# Patient Record
Sex: Female | Born: 1965 | Race: Black or African American | Hispanic: No | Marital: Single | Smoking: Former smoker
Health system: Southern US, Community
[De-identification: ages and names within clinical notes are randomized; demographics above are authoritative.]

## PROBLEM LIST (undated history)

## (undated) ENCOUNTER — Emergency Department (HOSPITAL_COMMUNITY): Discharge status: Absent without leave | Payer: Medicaid Other

## (undated) ENCOUNTER — Ambulatory Visit (HOSPITAL_COMMUNITY): Admission: EM | Payer: Medicaid Other | Source: Home / Self Care

## (undated) DIAGNOSIS — I639 Cerebral infarction, unspecified: Secondary | ICD-10-CM

## (undated) DIAGNOSIS — F209 Schizophrenia, unspecified: Secondary | ICD-10-CM

## (undated) DIAGNOSIS — F32A Depression, unspecified: Secondary | ICD-10-CM

## (undated) DIAGNOSIS — R569 Unspecified convulsions: Secondary | ICD-10-CM

## (undated) HISTORY — PX: LIMB SPARING RESECTION HIP W/ SADDLE JOINT REPLACEMENT: SUR829

---

## 2013-03-07 ENCOUNTER — Encounter (HOSPITAL_COMMUNITY): Payer: Self-pay | Admitting: Emergency Medicine

## 2013-03-07 ENCOUNTER — Emergency Department (HOSPITAL_COMMUNITY)
Admission: EM | Admit: 2013-03-07 | Discharge: 2013-03-07 | Disposition: A | Discharge status: Home / Self Care | Payer: Medicaid - Out of State | Attending: Emergency Medicine | Admitting: Emergency Medicine

## 2013-03-07 DIAGNOSIS — K08109 Complete loss of teeth, unspecified cause, unspecified class: Secondary | ICD-10-CM | POA: Insufficient documentation

## 2013-03-07 DIAGNOSIS — K0889 Other specified disorders of teeth and supporting structures: Secondary | ICD-10-CM

## 2013-03-07 DIAGNOSIS — Z792 Long term (current) use of antibiotics: Secondary | ICD-10-CM | POA: Insufficient documentation

## 2013-03-07 DIAGNOSIS — Z8659 Personal history of other mental and behavioral disorders: Secondary | ICD-10-CM | POA: Insufficient documentation

## 2013-03-07 DIAGNOSIS — R509 Fever, unspecified: Secondary | ICD-10-CM | POA: Insufficient documentation

## 2013-03-07 DIAGNOSIS — Z880 Allergy status to penicillin: Secondary | ICD-10-CM | POA: Insufficient documentation

## 2013-03-07 DIAGNOSIS — Z8669 Personal history of other diseases of the nervous system and sense organs: Secondary | ICD-10-CM | POA: Insufficient documentation

## 2013-03-07 DIAGNOSIS — K089 Disorder of teeth and supporting structures, unspecified: Secondary | ICD-10-CM | POA: Insufficient documentation

## 2013-03-07 DIAGNOSIS — Z8673 Personal history of transient ischemic attack (TIA), and cerebral infarction without residual deficits: Secondary | ICD-10-CM | POA: Insufficient documentation

## 2013-03-07 DIAGNOSIS — Z791 Long term (current) use of non-steroidal anti-inflammatories (NSAID): Secondary | ICD-10-CM | POA: Insufficient documentation

## 2013-03-07 DIAGNOSIS — K006 Disturbances in tooth eruption: Secondary | ICD-10-CM | POA: Insufficient documentation

## 2013-03-07 DIAGNOSIS — F17200 Nicotine dependence, unspecified, uncomplicated: Secondary | ICD-10-CM | POA: Insufficient documentation

## 2013-03-07 HISTORY — DX: Unspecified convulsions: R56.9

## 2013-03-07 HISTORY — DX: Cerebral infarction, unspecified: I63.9

## 2013-03-07 HISTORY — DX: Schizophrenia, unspecified: F20.9

## 2013-03-07 MED ORDER — IBUPROFEN 800 MG PO TABS: 800.0000 mg | ORAL_TABLET | Freq: Three times a day (TID) | ORAL | Status: DC

## 2013-03-07 MED ORDER — CLINDAMYCIN HCL 150 MG PO CAPS: 300.0000 mg | ORAL_CAPSULE | Freq: Three times a day (TID) | ORAL | Status: DC

## 2013-03-07 MED ORDER — HYDROCODONE-ACETAMINOPHEN 5-325 MG PO TABS: 1.0000 | ORAL_TABLET | Freq: Four times a day (QID) | ORAL | Status: DC | PRN
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
## 2013-03-07 NOTE — ED Notes (Signed)
Pt c/o pain to upper rt side of mouth that has been going on for about a week. Pt states she has not been able to go to a dentist because she just moved here from out of town. Pt has multiple dental carries and missing teeth. Pt rates pain 10/10. Swelling noted to gums.

## 2013-03-07 NOTE — ED Notes (Signed)
Discharge instructions reviewed with pt. Pt verbalized understanding.   

## 2013-03-07 NOTE — ED Provider Notes (Signed)
CSN: [id number]     Arrival date & time 03/07/13  1522 History   This chart was scribed for Jaynie Crumble, PA-C, working with Shanna Cisco, MD, by Andrew Au, ED Scribe. This patient was seen in room TR10C/TR10C and the patient's care was started at 4:30 PM  Chief Complaint  Patient presents with  . Dental Pain    The history is provided by the patient. No language interpreter was used.   HPI Comments: Joanne Gonzalez is a 47 y.o. female who presents to the Emergency Department complaining of 2 days of gradual onset, gradually worsening, constant right upper dental pain. Pt reports that she has had an associated fever of 102 last night which has subsided. ED is 98.56F. Pt states that she has been taking aleve with relief of fever but without relief to dental pain. She reports that she has history of teeth removal. Pt states the she has no medication allergies. Pt states that she drove herself to ED.   Past Medical History  Diagnosis Date  . CVA (cerebral infarction)   . Seizures   . Schizophrenia    History reviewed. No pertinent past surgical history. History reviewed. No pertinent family history. History  Substance Use Topics  . Smoking status: Current Every Day Smoker -- 0.50 packs/day    Types: Cigarettes  . Smokeless tobacco: Not on file  . Alcohol Use: No   OB History   Grav Para Term Preterm Abortions TAB SAB Ect Mult Living                 Review of Systems  Constitutional: Positive for fever.  HENT: Positive for dental problem. Negative for facial swelling.   All other systems reviewed and are negative.   Allergies  Penicillins and Lactose intolerance (gi)  Home Medications   Current Outpatient Rx  Name  Route  Sig  Dispense  Refill  . clindamycin (CLEOCIN) 150 MG capsule   Oral   Take 2 capsules (300 mg total) by mouth 3 (three) times daily.   42 capsule   0   . HYDROcodone-acetaminophen (NORCO) 5-325 MG per tablet   Oral   Take 1 tablet by  mouth every 6 (six) hours as needed.   20 tablet   0   . ibuprofen (ADVIL,MOTRIN) 800 MG tablet   Oral   Take 1 tablet (800 mg total) by mouth 3 (three) times daily.   21 tablet   0     Triage Vitals BP 136/76  Pulse 88  Temp(Src) 98.1 F (36.7 C) (Oral)  Resp 18  Wt 132 lb 4.8 oz (60.011 kg)  SpO2 100%  LMP 02/08/2013  Physical Exam  Nursing note and vitals reviewed. Constitutional: She is oriented to person, place, and time. She appears well-developed and well-nourished. No distress.  HENT:  Head: Normocephalic and atraumatic.  Poor dentition. Multiple missing teeth. Mild swelling surrounding right upper first molar with tenderness to palpation. No facial swelling or swelling of the tongue   Eyes: EOM are normal.  Neck: Neck supple. No tracheal deviation present.  Cardiovascular: Normal rate.   Pulmonary/Chest: Effort normal. No respiratory distress.  Musculoskeletal: Normal range of motion.  Neurological: She is alert and oriented to person, place, and time.  Skin: Skin is warm and dry.  Psychiatric: She has a normal mood and affect. Her behavior is normal.    ED Course  Procedures (including critical care time)  DIAGNOSTIC STUDIES: Oxygen Saturation is 100% on RA, normal by  my interpretation.    COORDINATION OF CARE: 4:43 PM-  Has been ordered antibiotics and pain medication. Will give referral to dentistry and oral surgery and Pt advised of plan for treatment and pt agrees.   Labs Review Labs Reviewed - No data to display Imaging Review No results found.  EKG Interpretation   None       MDM   1. Pain, dental     Patient with poor dentition. She has multiple caries and multiple missing teeth. Will cover for possible abscess. Will start on clindamycin since she is allergic to penicillins, pain medication, given a resource guide for dental and psychiatric referrals.   Filed Vitals:   03/07/13 1530  BP: 136/76  Pulse: 88  Temp: 98.1 F (36.7 C)   TempSrc: Oral  Resp: 18  Weight: 132 lb 4.8 oz (60.011 kg)  SpO2: 100%    I personally performed the services described in this documentation, which was scribed in my presence. The recorded information has been reviewed and is accurate.    Lottie Mussel, PA-C 03/08/13 0128

## 2013-03-08 NOTE — ED Provider Notes (Signed)
Medical screening examination/treatment/procedure(s) were performed by non-physician practitioner and as supervising physician I was immediately available for consultation/collaboration.  EKG Interpretation   None         Shanna Cisco, MD 03/08/13 0131

## 2013-04-13 ENCOUNTER — Emergency Department (HOSPITAL_COMMUNITY)
Admission: EM | Admit: 2013-04-13 | Discharge: 2013-04-13 | Disposition: A | Discharge status: Home / Self Care | Payer: Medicaid Other | Attending: Emergency Medicine | Admitting: Emergency Medicine

## 2013-04-13 ENCOUNTER — Emergency Department (HOSPITAL_COMMUNITY): Payer: Medicaid Other

## 2013-04-13 ENCOUNTER — Encounter (HOSPITAL_COMMUNITY): Payer: Self-pay | Admitting: Emergency Medicine

## 2013-04-13 DIAGNOSIS — Z791 Long term (current) use of non-steroidal anti-inflammatories (NSAID): Secondary | ICD-10-CM | POA: Insufficient documentation

## 2013-04-13 DIAGNOSIS — G8190 Hemiplegia, unspecified affecting unspecified side: Secondary | ICD-10-CM

## 2013-04-13 DIAGNOSIS — Z8673 Personal history of transient ischemic attack (TIA), and cerebral infarction without residual deficits: Secondary | ICD-10-CM | POA: Insufficient documentation

## 2013-04-13 DIAGNOSIS — R0789 Other chest pain: Secondary | ICD-10-CM | POA: Insufficient documentation

## 2013-04-13 DIAGNOSIS — Z880 Allergy status to penicillin: Secondary | ICD-10-CM | POA: Insufficient documentation

## 2013-04-13 DIAGNOSIS — Z8659 Personal history of other mental and behavioral disorders: Secondary | ICD-10-CM | POA: Insufficient documentation

## 2013-04-13 DIAGNOSIS — F17200 Nicotine dependence, unspecified, uncomplicated: Secondary | ICD-10-CM | POA: Insufficient documentation

## 2013-04-13 LAB — URINALYSIS, ROUTINE W REFLEX MICROSCOPIC
Bilirubin Urine: NEGATIVE
Glucose, UA: NEGATIVE mg/dL
Hgb urine dipstick: NEGATIVE
Ketones, ur: NEGATIVE mg/dL
Nitrite: NEGATIVE
PH: 6.5 (ref 5.0–8.0)
Protein, ur: NEGATIVE mg/dL
SPECIFIC GRAVITY, URINE: 1.011 (ref 1.005–1.030)
Urobilinogen, UA: 0.2 mg/dL (ref 0.0–1.0)

## 2013-04-13 LAB — COMPREHENSIVE METABOLIC PANEL
ALT: 16 U/L (ref 0–35)
AST: 19 U/L (ref 0–37)
Albumin: 3.9 g/dL (ref 3.5–5.2)
Alkaline Phosphatase: 67 U/L (ref 39–117)
BILIRUBIN TOTAL: 0.3 mg/dL (ref 0.3–1.2)
BUN: 13 mg/dL (ref 6–23)
CHLORIDE: 100 meq/L (ref 96–112)
CO2: 22 mEq/L (ref 19–32)
CREATININE: 0.67 mg/dL (ref 0.50–1.10)
Calcium: 10 mg/dL (ref 8.4–10.5)
GFR calc non Af Amer: >90 mL/min (ref >90)
GLUCOSE: 86 mg/dL (ref 70–99)
Potassium: 4.3 mEq/L (ref 3.7–5.3)
Sodium: 136 mEq/L — ABNORMAL LOW (ref 137–147)
Total Protein: 7.9 g/dL (ref 6.0–8.3)

## 2013-04-13 LAB — ETHANOL

## 2013-04-13 LAB — URINE MICROSCOPIC-ADD ON

## 2013-04-13 LAB — POCT I-STAT TROPONIN I
TROPONIN I, POC: 0 ng/mL (ref 0.00–0.08)
Troponin i, poc: 0 ng/mL (ref 0.00–0.08)

## 2013-04-13 LAB — CBC
HEMATOCRIT: 38.4 % (ref 36.0–46.0)
HEMOGLOBIN: 13.1 g/dL (ref 12.0–15.0)
MCH: 30.4 pg (ref 26.0–34.0)
MCHC: 34.1 g/dL (ref 30.0–36.0)
MCV: 89.1 fL (ref 78.0–100.0)
Platelets: 307 10*3/uL (ref 150–400)
RBC: 4.31 MIL/uL (ref 3.87–5.11)
RDW: 15.4 % (ref 11.5–15.5)
WBC: 6.9 10*3/uL (ref 4.0–10.5)

## 2013-04-13 LAB — DIFFERENTIAL
BASOS PCT: 1 % (ref 0–1)
Basophils Absolute: 0.1 10*3/uL (ref 0.0–0.1)
EOS ABS: 0.2 10*3/uL (ref 0.0–0.7)
EOS PCT: 2 % (ref 0–5)
LYMPHS ABS: 2.1 10*3/uL (ref 0.7–4.0)
Lymphocytes Relative: 32 % (ref 12–46)
MONO ABS: 0.7 10*3/uL (ref 0.1–1.0)
MONOS PCT: 11 % (ref 3–12)
NEUTROS PCT: 55 % (ref 43–77)
Neutro Abs: 3.6 10*3/uL (ref 1.7–7.7)

## 2013-04-13 LAB — GLUCOSE, CAPILLARY: Glucose-Capillary: 95 mg/dL (ref 70–99)

## 2013-04-13 LAB — APTT: aPTT: 31 seconds (ref 24–37)

## 2013-04-13 LAB — RAPID URINE DRUG SCREEN, HOSP PERFORMED
Amphetamines: NOT DETECTED
Barbiturates: NOT DETECTED
Benzodiazepines: NOT DETECTED
COCAINE: NOT DETECTED
Opiates: NOT DETECTED
TETRAHYDROCANNABINOL: NOT DETECTED

## 2013-04-13 LAB — PROTIME-INR
INR: 0.96 (ref 0.00–1.49)
PROTHROMBIN TIME: 12.6 s (ref 11.6–15.2)

## 2013-04-13 MED ORDER — LORAZEPAM 2 MG/ML IJ SOLN: 1.0000 mg | Freq: Once | INTRAMUSCULAR | Status: DC

## 2013-04-13 MED ORDER — MIDAZOLAM HCL 5 MG/ML IJ SOLN
10.0000 mg | Freq: Once | INTRAMUSCULAR | Status: DC
  Filled 2013-04-13: qty 2

## 2013-04-13 MED ORDER — QUETIAPINE FUMARATE 25 MG PO TABS
100.0000 mg | ORAL_TABLET | Freq: Every day | ORAL | Status: DC
  Filled 2013-04-13: qty 4

## 2013-04-13 MED ORDER — QUETIAPINE FUMARATE 50 MG PO TABS: 50.0000 mg | ORAL_TABLET | Freq: Every day | ORAL | Status: DC

## 2013-04-13 MED ORDER — QUETIAPINE FUMARATE 25 MG PO TABS
50.0000 mg | ORAL_TABLET | Freq: Once | ORAL | Status: AC
  Administered 2013-04-13: 50 mg via ORAL

## 2013-04-13 MED ORDER — MIDAZOLAM HCL 10 MG/2ML IJ SOLN: 10.0000 mg | Freq: Once | INTRAMUSCULAR | Status: DC
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
## 2013-04-13 NOTE — ED Notes (Signed)
PT c/o intermittent L sided chest pain, L arm pain and L facial "tightness" since yesterday.  Pt also c/o sob.

## 2013-04-13 NOTE — ED Notes (Signed)
Patient is alert and orientedx4.  Patient was explained discharge instructions and they understood them with no questions.  The patient's daughter, Joanne Chars is coming to take the patient home.

## 2013-04-13 NOTE — ED Provider Notes (Signed)
CSN: [id number]     Arrival date & time 04/13/13  1028 History   First MD Initiated Contact with Patient 04/13/13 1051     Chief Complaint  Patient presents with  . Chest Pain  . Arm Pain  . facial tingling    (Consider location/radiation/quality/duration/timing/severity/associated sxs/prior Treatment) HPI Comments: 48 year old female presents with left arm pain as well as facial weakness, and tightness and left arm and leg weakness. States his symptoms started over 48 hours ago have been progressively getting worse. At first she stated these are intermittent and when asked to clarify last time she felt normal she states those 48 hours ago has been getting progressively worse. She also feels a chest pressure. The patient denies any headaches. She states her speech is different. She states these are similar symptoms when she had a CVA multiple years ago. The symptoms are on the same side as well. She states that she fully recovered and had no residual weakness after rehabilitation from her previous stroke.   Past Medical History  Diagnosis Date  . CVA (cerebral infarction)   . Seizures   . Schizophrenia    Past Surgical History  Procedure Laterality Date  . Limb sparing resection hip w/ saddle joint replacement     No family history on file. History  Substance Use Topics  . Smoking status: Current Every Day Smoker -- 0.50 packs/day    Types: Cigarettes  . Smokeless tobacco: Not on file  . Alcohol Use: No   OB History   Grav Para Term Preterm Abortions TAB SAB Ect Mult Living                 Review of Systems  Constitutional: Negative for fever.  Respiratory: Negative for cough and shortness of breath.   Cardiovascular: Positive for chest pain.  Gastrointestinal: Negative for nausea and vomiting.  Musculoskeletal:       Left arm pain  Neurological: Positive for speech difficulty and weakness. Negative for numbness.  All other systems reviewed and are  negative.    Allergies  Penicillins and Lactose intolerance (gi)  Home Medications   Current Outpatient Rx  Name  Route  Sig  Dispense  Refill  . HYDROcodone-acetaminophen (NORCO) 5-325 MG per tablet   Oral   Take 1 tablet by mouth every 6 (six) hours as needed.   20 tablet   0   . ibuprofen (ADVIL,MOTRIN) 800 MG tablet   Oral   Take 1 tablet (800 mg total) by mouth 3 (three) times daily.   21 tablet   0    BP 130/81  Pulse 78  Resp 18  Ht 5\' 4"  (1.626 m)  Wt 130 lb (58.968 kg)  BMI 22.30 kg/m2  SpO2 95%  LMP 04/04/2013 Physical Exam  Vitals reviewed. Constitutional: She is oriented to person, place, and time. She appears well-developed and well-nourished.  HENT:  Head: Normocephalic and atraumatic.  Right Ear: External ear normal.  Left Ear: External ear normal.  Nose: Nose normal.  Eyes: Right eye exhibits no discharge. Left eye exhibits no discharge.  Cardiovascular: Normal rate, regular rhythm and normal heart sounds.   Pulmonary/Chest: Effort normal and breath sounds normal. She exhibits no tenderness.  Abdominal: Soft. There is no tenderness.  Neurological: She is alert and oriented to person, place, and time.  Decreased strength left upper and left lower extremity. She is able to come off the bed but not against resistance. Slight facial droop. Her tongue deviates to the right.  Skin: Skin is warm and dry.  Psychiatric:  Tearful    ED Course  Procedures (including critical care time) Labs Review Labs Reviewed  COMPREHENSIVE METABOLIC PANEL - Abnormal; Notable for the following:    Sodium 136 (*)    All other components within normal limits  URINALYSIS, ROUTINE W REFLEX MICROSCOPIC - Abnormal; Notable for the following:    Leukocytes, UA TRACE (*)    All other components within normal limits  URINE MICROSCOPIC-ADD ON - Abnormal; Notable for the following:    Squamous Epithelial / LPF FEW (*)    Bacteria, UA FEW (*)    All other components within  normal limits  CBC  ETHANOL  PROTIME-INR  APTT  DIFFERENTIAL  URINE RAPID DRUG SCREEN (HOSP PERFORMED)  GLUCOSE, CAPILLARY  POCT I-STAT TROPONIN I  POCT I-STAT TROPONIN I   Imaging Review Dg Chest 2 View  04/13/2013   CLINICAL DATA:  Chest pain, arm pain  EXAM: CHEST  2 VIEW  COMPARISON:  None.  FINDINGS: Low lung volumes. The heart size and mediastinal contours are within normal limits. Both lungs are clear. The visualized skeletal structures are unremarkable.  IMPRESSION: No active cardiopulmonary disease.   Electronically Signed   By: Margaree Mackintosh M.D.   On: 04/13/2013 11:15   Ct Head Wo Contrast  04/13/2013   CLINICAL DATA:  48 year old female with left upper extremity pain, left facial abnormal sensation. Extremity weakness. Initial encounter.  EXAM: CT HEAD WITHOUT CONTRAST  TECHNIQUE: Contiguous axial images were obtained from the base of the skull through the vertex without intravenous contrast.  COMPARISON:  None.  FINDINGS: Visualized paranasal sinuses and mastoids are clear. No acute osseous abnormality identified. Visualized orbits and scalp soft tissues are within normal limits.  Cerebral volume is normal. No midline shift, ventriculomegaly, mass effect, evidence of mass lesion, intracranial hemorrhage or evidence of cortically based acute infarction. Gray-white matter differentiation is within normal limits throughout the brain. No suspicious intracranial vascular hyperdensity.  IMPRESSION: Normal noncontrast CT appearance of the brain.   Electronically Signed   By: Lars Pinks M.D.   On: 04/13/2013 13:16    EKG Interpretation    Date/Time:  Friday April 13 2013 10:31:07 EST Ventricular Rate:  83 PR Interval:  132 QRS Duration: 74 QT Interval:  370 QTC Calculation: 434 R Axis:   39 Text Interpretation:  Normal sinus rhythm Normal ECG No old tracing to compare Confirmed by Alysha Doolan  MD, Roben Schliep ([id number]) on 04/13/2013 10:51:27 AM            MDM   1. Hemiplegia,  unspecified, affecting nondominant side    Patient has exam with weakness but seems inconsistent. However, given her reported history of stroke, she was evaluated as above. Workup is negative. Neurology has seen and also questions whether or not this is a true stroke. They recommend MRI and if negative she can be discharged with outpatient follow up. She has a benign EKG and 2 negative troponins, I feel ACS is highly unlikely. CXR is benign, this is unlikely to represent dissection in this scenario. Care transferred with MRI pending.    Ephraim Hamburger, MD 04/13/13 [phone]

## 2013-04-13 NOTE — ED Provider Notes (Addendum)
49 year old female who presents today stating that she is unable to walk and is weak on her left side the she's been seen and evaluated by Dr. Verner Chol and by neurology. Neurology requested an MRI. They feel that it is unlikely that the constellation of symptoms represents a stroke but she does need evaluation with MRI. Patient was taken to MRI and then refused having an MRI. Patient has return to the emergency department I have discussed with her possible medication options. She continued to state that she doesn't want to take anything and can't have the MRI. She does state that she is often given Seroquel it makes her sleep. She states she is tired but if she goes to sleep she'll be able to have the MRI. We are currently giving her Seroquel 100 mg by mouth and then she will be reevaluated for possibility of MRI.  MRI done without evidence of acute ischemia.  Plan patient ambulation and subsequent discharge.   Shaune Pollack, MD 04/13/13 2238  Patient ambulated here by nursing and daughter called to transport home.   Shaune Pollack, MD 04/13/13 [phone]

## 2013-04-13 NOTE — ED Notes (Signed)
Notified RN of CBG [id number]

## 2013-04-13 NOTE — Discharge Instructions (Signed)
Please follow up with your doctor next week
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
## 2013-04-14 MED ORDER — LIDOCAINE HCL (CARDIAC) 20 MG/ML IV SOLN
INTRAVENOUS | Status: AC
  Filled 2013-04-14: qty 5

## 2013-04-20 ENCOUNTER — Encounter (HOSPITAL_COMMUNITY): Payer: Self-pay | Admitting: Emergency Medicine

## 2013-04-20 ENCOUNTER — Emergency Department (HOSPITAL_COMMUNITY)
Admission: EM | Admit: 2013-04-20 | Discharge: 2013-04-20 | Disposition: A | Discharge status: Home / Self Care | Payer: Medicaid Other | Attending: Emergency Medicine | Admitting: Emergency Medicine

## 2013-04-20 DIAGNOSIS — Z3202 Encounter for pregnancy test, result negative: Secondary | ICD-10-CM | POA: Insufficient documentation

## 2013-04-20 DIAGNOSIS — R5381 Other malaise: Secondary | ICD-10-CM | POA: Insufficient documentation

## 2013-04-20 DIAGNOSIS — Z8673 Personal history of transient ischemic attack (TIA), and cerebral infarction without residual deficits: Secondary | ICD-10-CM | POA: Insufficient documentation

## 2013-04-20 DIAGNOSIS — R002 Palpitations: Secondary | ICD-10-CM | POA: Insufficient documentation

## 2013-04-20 DIAGNOSIS — F411 Generalized anxiety disorder: Secondary | ICD-10-CM | POA: Insufficient documentation

## 2013-04-20 DIAGNOSIS — Z8669 Personal history of other diseases of the nervous system and sense organs: Secondary | ICD-10-CM | POA: Insufficient documentation

## 2013-04-20 DIAGNOSIS — F1410 Cocaine abuse, uncomplicated: Secondary | ICD-10-CM | POA: Insufficient documentation

## 2013-04-20 DIAGNOSIS — Z880 Allergy status to penicillin: Secondary | ICD-10-CM | POA: Insufficient documentation

## 2013-04-20 DIAGNOSIS — Z8659 Personal history of other mental and behavioral disorders: Secondary | ICD-10-CM | POA: Insufficient documentation

## 2013-04-20 DIAGNOSIS — F1490 Cocaine use, unspecified, uncomplicated: Secondary | ICD-10-CM

## 2013-04-20 DIAGNOSIS — R5383 Other fatigue: Secondary | ICD-10-CM

## 2013-04-20 DIAGNOSIS — R109 Unspecified abdominal pain: Secondary | ICD-10-CM | POA: Insufficient documentation

## 2013-04-20 DIAGNOSIS — F17200 Nicotine dependence, unspecified, uncomplicated: Secondary | ICD-10-CM | POA: Insufficient documentation

## 2013-04-20 DIAGNOSIS — R112 Nausea with vomiting, unspecified: Secondary | ICD-10-CM | POA: Insufficient documentation

## 2013-04-20 DIAGNOSIS — Z79899 Other long term (current) drug therapy: Secondary | ICD-10-CM | POA: Insufficient documentation

## 2013-04-20 DIAGNOSIS — T50903A Poisoning by unspecified drugs, medicaments and biological substances, assault, initial encounter: Secondary | ICD-10-CM | POA: Insufficient documentation

## 2013-04-20 DIAGNOSIS — Z791 Long term (current) use of non-steroidal anti-inflammatories (NSAID): Secondary | ICD-10-CM | POA: Insufficient documentation

## 2013-04-20 DIAGNOSIS — R209 Unspecified disturbances of skin sensation: Secondary | ICD-10-CM | POA: Insufficient documentation

## 2013-04-20 DIAGNOSIS — T405X1A Poisoning by cocaine, accidental (unintentional), initial encounter: Secondary | ICD-10-CM | POA: Insufficient documentation

## 2013-04-20 LAB — COMPREHENSIVE METABOLIC PANEL
ALBUMIN: 4.1 g/dL (ref 3.5–5.2)
ALK PHOS: 72 U/L (ref 39–117)
ALT: 17 U/L (ref 0–35)
AST: 18 U/L (ref 0–37)
BUN: 7 mg/dL (ref 6–23)
CO2: 21 mEq/L (ref 19–32)
Calcium: 8.9 mg/dL (ref 8.4–10.5)
Chloride: 101 mEq/L (ref 96–112)
Creatinine, Ser: 0.64 mg/dL (ref 0.50–1.10)
GFR calc Af Amer: >90 mL/min (ref >90)
GFR calc non Af Amer: >90 mL/min (ref >90)
Glucose, Bld: 97 mg/dL (ref 70–99)
POTASSIUM: 3.7 meq/L (ref 3.7–5.3)
SODIUM: 138 meq/L (ref 137–147)
TOTAL PROTEIN: 8.3 g/dL (ref 6.0–8.3)
Total Bilirubin: 0.2 mg/dL — ABNORMAL LOW (ref 0.3–1.2)

## 2013-04-20 LAB — CBC WITH DIFFERENTIAL/PLATELET
BASOS PCT: 1 % (ref 0–1)
Basophils Absolute: 0.1 10*3/uL (ref 0.0–0.1)
Eosinophils Absolute: 0.1 10*3/uL (ref 0.0–0.7)
Eosinophils Relative: 1 % (ref 0–5)
HCT: 39.9 % (ref 36.0–46.0)
Hemoglobin: 13.8 g/dL (ref 12.0–15.0)
Lymphocytes Relative: 23 % (ref 12–46)
Lymphs Abs: 2.3 10*3/uL (ref 0.7–4.0)
MCH: 30.8 pg (ref 26.0–34.0)
MCHC: 34.6 g/dL (ref 30.0–36.0)
MCV: 89.1 fL (ref 78.0–100.0)
MONOS PCT: 9 % (ref 3–12)
Monocytes Absolute: 0.9 10*3/uL (ref 0.1–1.0)
NEUTROS ABS: 6.7 10*3/uL (ref 1.7–7.7)
NEUTROS PCT: 67 % (ref 43–77)
PLATELETS: 350 10*3/uL (ref 150–400)
RBC: 4.48 MIL/uL (ref 3.87–5.11)
RDW: 14.9 % (ref 11.5–15.5)
WBC: 10 10*3/uL (ref 4.0–10.5)

## 2013-04-20 LAB — PROTIME-INR
INR: 0.97 (ref 0.00–1.49)
PROTHROMBIN TIME: 12.7 s (ref 11.6–15.2)

## 2013-04-20 LAB — PREGNANCY, URINE: Preg Test, Ur: NEGATIVE

## 2013-04-20 LAB — URINALYSIS, ROUTINE W REFLEX MICROSCOPIC
Bilirubin Urine: NEGATIVE
GLUCOSE, UA: NEGATIVE mg/dL
Hgb urine dipstick: NEGATIVE
KETONES UR: NEGATIVE mg/dL
Leukocytes, UA: NEGATIVE
NITRITE: NEGATIVE
PH: 7 (ref 5.0–8.0)
Protein, ur: NEGATIVE mg/dL
Specific Gravity, Urine: 1.007 (ref 1.005–1.030)
Urobilinogen, UA: 0.2 mg/dL (ref 0.0–1.0)

## 2013-04-20 LAB — RAPID URINE DRUG SCREEN, HOSP PERFORMED
AMPHETAMINES: NOT DETECTED
BARBITURATES: NOT DETECTED
Benzodiazepines: NOT DETECTED
Cocaine: POSITIVE — AB
OPIATES: NOT DETECTED
TETRAHYDROCANNABINOL: NOT DETECTED

## 2013-04-20 LAB — SALICYLATE LEVEL: Salicylate Lvl: 4.7 mg/dL (ref 2.8–20.0)

## 2013-04-20 LAB — ACETAMINOPHEN LEVEL: Acetaminophen (Tylenol), Serum: <15 ug/mL (ref 10–30)

## 2013-04-20 LAB — ETHANOL: Alcohol, Ethyl (B): <11 mg/dL (ref 0–11)

## 2013-04-20 MED ORDER — LORAZEPAM 1 MG PO TABS
1.0000 mg | ORAL_TABLET | Freq: Once | ORAL | Status: AC
  Administered 2013-04-20: 1 mg via ORAL
  Filled 2013-04-20: qty 1
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
## 2013-04-20 NOTE — Discharge Instructions (Signed)
°Emergency Department Resource Guide °1) Find a Doctor and Pay Out of Pocket °Although you won't have to find out who is covered by your insurance plan, it is a good idea to ask around and get recommendations. You will then need to call the office and see if the doctor you have chosen will accept you as a new patient and what types of options they offer for patients who are self-pay. Some doctors offer discounts or will set up payment plans for their patients who do not have insurance, but you will need to ask so you aren't surprised when you get to your appointment. ° °2) Contact Your Local Health Department °Not all health departments have doctors that can see patients for sick visits, but many do, so it is worth a call to see if yours does. If you don't know where your local health department is, you can check in your phone book. The CDC also has a tool to help you locate your state's health department, and many state websites also have listings of all of their local health departments. ° °3) Find a Walk-in Clinic °If your illness is not likely to be very severe or complicated, you may want to try a walk in clinic. These are popping up all over the country in pharmacies, drugstores, and shopping centers. They're usually staffed by nurse practitioners or physician assistants that have been trained to treat common illnesses and complaints. They're usually fairly quick and inexpensive. However, if you have serious medical issues or chronic medical problems, these are probably not your best option. ° °No Primary Care Doctor: °- Call [facility] at  832-8000 - they can help you locate a primary care doctor that  accepts your insurance, provides certain services, etc. °- Physician Referral Service- 1-800-533-3463 ° °Chronic Pain Problems: °Organization         Address  Phone   Notes  °[facility]  (336) 297-2271 Patients need to be referred by their primary care doctor.  ° °Medication  Assistance: °Organization         Address  Phone   Notes  °[place] [facility] [address] °[place], [place] [address] (336) 641-8030 --Must be a resident of [place] °-- Must have NO insurance coverage whatsoever (no Medicaid/ Medicare, etc.) °-- The pt. MUST have a primary care doctor that directs their care regularly and follows them in the community °  °[facility]  (866) 331-1348   °[employer]  (888) 892-1162   ° °Agencies that provide inexpensive medical care: °Organization         Address  Phone   Notes  °[facility]  (336) 832-8035   °[facility]    (336) 832-7272   °[facility] [address] °[place], [place] [address] (336) 832-4777   °[facility] [address], °[place] (336) 271-4999   °[facility]    (336) 373-0678   °[facility]    (336) 272-1050   °[facility] ° [address], [place] Phone:  (336) 832-4444, Fax:  (336) 832-4440 Hours of Operation:  9 am - 6 pm, M-F.  Also accepts Medicaid/Medicare and self-pay.  °[facility] ° [address], [place] Phone: (336) 832-3150, Fax: (336) 832-3151. Hours of Operation:  8:30 am - 5:30 pm, M-F.  Also accepts Medicaid and self-pay.  °[facility] [place] [address], [place] Phone: (336) 878-6027   °[facility] [address], [place], [place] (336)723-1848, Ext. 123 Mondays & Thursdays: 7-9 AM.  First 15 patients are seen on a first come, first serve basis. °  ° °Medicaid-accepting [place] Providers: ° °Organization         Address  Phone   Notes  °[facility] [address], [place] (336) 641-2100 Also accepts self-pay patients.  °[facility] [address], [place] ° (336) 856-9996   °[facility] [address], [place]  (336) 288-8857   °[facility] [address] [place] [address], [place] (336) 299-7000   °Veita Bland [address], [place]  ° (336) 373-1557 Only accepts [place] Access Medicaid patients after they have their name applied to their card.  ° °Self-Pay (no insurance) in [place]: ° °Organization         Address  Phone   Notes  °Sickle Cell Patients, [facility] [address], [place] (336) 832-1970   °[facility] [address], [place] (336) 832-4400   °[facility] ° [address], [place] (336) 992-4800   °[facility]/Dr. Osei-Bonsu ° [address] [place] [address], [place] or [address], [place] (336) 841-8500 Phone number for both [place] and [place] locations is the same.  °[facility] [address], [place] (336) 299-0000   °[facility] [address] [place] [address], [place] or [address] (336) 852-7530 °(336) 878-2260   °[facility] [address], [place] (336) 350-1642, phone; (336) 294-5005, fax Sees patients 1st and 3rd Saturday of every month.  Must not qualify for public or private insurance (i.e. Medicaid, Medicare, [place] Health Choice, Veterans' Benefits) • Household income should be no more than 200% of the poverty level •The clinic cannot treat you if you are pregnant or think you are pregnant • Sexually transmitted diseases are not treated at the clinic.  ° ° °Dental Care: °Organization         Address  Phone  Notes  °[place] [facility] [address], [place] (336) 641-6152 Accepts children up to age 21 who are enrolled in Medicaid or [place] Health Choice; pregnant women with a Medicaid card; and children who have applied for Medicaid or [place] Health Choice, but were declined, whose parents can pay a reduced fee at time of service.  °[place]  [facility] [place]  [address], [place] (336) 641-7733 Accepts children up to age 21 who are enrolled in Medicaid or [place] Health Choice; pregnant women with a Medicaid card; and children who have applied for Medicaid or [place] Health Choice, but were declined, whose parents can pay a reduced fee at time of service.  °[facility] ° [address], [place] (336) 641-4533 Patients are seen by appointment only. Walk-ins are not accepted. [facility] will see patients 18 years of age and older. °Monday - Tuesday (8am-5pm) °Most Wednesdays (8:30-5pm) °$30 per visit, cash only  °[facility] ° [address], [place] (336) 641-4533 Patients are seen by appointment only. Walk-ins are not accepted. [facility] will see patients 18 years of age and older. °One   Wednesday Evening (Monthly: Volunteer Based).  $30 per visit, cash only  °[facility]  (919) 537-3737 for adults; Children under age 4, call Graduate Pediatric Dentistry at (919) [phone]. Children aged 4-14, please call (919) [phone] to request a pediatric application. ° Dental services are provided in all areas of dental care including fillings, crowns and bridges, complete and partial dentures, implants, gum treatment, root canals, and extractions. Preventive care is also provided. Treatment is provided to both adults and children. °Patients are selected via a lottery and there is often a waiting list. °  °[facility] [address], °[place] ° (336) 763-8833 [email or web address] °  °[facility] [address], [place], [place] (336)723-1848, Ext. 123 Second and Fourth Thursday of each month, opens at 6:30 AM; Clinic ends at 9 AM.  Patients are seen on a first-come first-served basis, and a limited number are seen during each clinic.  ° °[facility] ° [address], [place], [place] (336) 723-7904    Eligibility Requirements °You must have lived in [place], [place], or [place] counties for at least the last three months. °  You cannot be eligible for state or federal sponsored healthcare insurance, including Veterans Administration, Medicaid, or Medicare. °  You generally cannot be eligible for healthcare insurance through your employer.  °  How to apply: °Eligibility screenings are held every Tuesday and Wednesday afternoon from 1:00 pm until 4:00 pm. You do not need an appointment for the interview!  °[facility] [address], [place], [place] 336-631-2330   °[facility]  336-342-8273   °[facility]  336-703-3100   °[facility]  336-570-6415   ° °Behavioral Health Resources in the Community: °Intensive Outpatient Programs °Organization         Address  Phone  Notes  °[facility] [address], [place], [place] 336-878-6098   °[facility] [address], [place], [place] 336-832-9800   °[facility] [address], [place], [place] ° 336-882-2125   °[facility] [address],  °[place], [place] 1-800-853-5163 or 336-641-4981   °Substance Abuse Resources °Organization         Address  Phone  Notes  °[facility]  336-882-2125   °[facility]  336-784-9470   °[facility]  336-285-9073   °[facility]  336-845-3988   °Residential & Outpatient Substance Abuse Program  1-800-659-3381   °Psychological Services °Organization         Address  Phone  Notes  °[facility]  336- 832-9600   °[facility]  336- 378-7881   °[facility] [address], [place] 1-800-853-5163 or 336-641-4981   ° °Mobile Crisis Teams °Organization         Address  Phone  Notes  °[facility], Mobile Crisis Care Unit  1-877-626-1772   °Assertive °[facility] ° [address]  [place], [place] 336-834-9664   °Sharon DeEsch [address] °[place] 336-554-5454   ° °Self-Help/Support Groups °Organization         Address  Phone             Notes  °[facility] [place] [place] - variety of support groups  336- 373-1402 Call for more information  °Narcotics Anonymous (NA), [facility] [address], °[place]  2 meetings at this location  ° °  Residential Treatment Programs °Organization         Address  Phone  Notes  °[facility] [address],    °[place]  1-866-801-8205   °[facility] ° [address], [place], [place] 704-293-8524   °[facility] [address], [place] 336-845-3988 Admissions: 8am-3pm M-F  °[facility] [address]    °[place], [place] 336-841-1104   °[facility] [address], [place], [place] 336-379-7146   °[facility] [address]  °[place], [place] 336-285-9073   °[facility] - Intensive Outpatient [address], [place], [place] 336-852-3033   °[facility] [address]  °[place], [place] 1-877-615-2722 or 336-784-9470   °[facility] [address] [place], [place] 336-227-7417 Accepts Medicaid  °[facility] [address]  ° [place] 1-800-659-3381 Substance Abuse/Addiction Treatment  ° °[place] Behavioral Health Resources °Organization         Address  Phone  Notes  °[facility]  (888) 581-9988   °Julie Brannon, PhD [address] [place], [place]   (336) 349-5553 or (336) 951-0000   °[facility]   [address] °[place], [place] (336) 349-4454   °[facility] [address], [place], [place] (336) 342-8316 Insurance/Medicaid/sponsorship through [facility]  °[facility] [address]                                    [place], [place] (336) 342-8316 Therapy/tele-psych/case    °[facility] [address]  ° [place], [place] (336) 349-2233    °Dr. Arfeen  (336) 349-4544   °[facility] [place]  [employer] [place] [facility] 1) [address], [place] °2) [address], [place] °3)  [address], [place] (336) 349-3220 °(336) 342-7768 ° °(336) 342-8140   °[place] [facility] (336) 342-1394 or (336) 342-3537 (After Hours)    ° ° °

## 2013-04-22 NOTE — ED Provider Notes (Signed)
Medical screening examination/treatment/procedure(s) were performed by non-physician practitioner and as supervising physician I was immediately available for consultation/collaboration.  EKG Interpretation    Date/Time:  Friday April 20 2013 05:10:56 EST Ventricular Rate:  86 PR Interval:  136 QRS Duration: 72 QT Interval:  361 QTC Calculation: 432 R Axis:   44 Text Interpretation:  Sinus rhythm Confirmed by Sabirin Baray  MD, Shiane Wenberg ([id number]) on 04/20/2013 6:54:45 AM             Kalman Drape, MD 04/22/13 2124

## 2013-06-06 ENCOUNTER — Emergency Department (HOSPITAL_COMMUNITY)
Admission: EM | Admit: 2013-06-06 | Discharge: 2013-06-06 | Disposition: A | Discharge status: Home / Self Care | Payer: Medicaid Other | Attending: Emergency Medicine | Admitting: Emergency Medicine

## 2013-06-06 ENCOUNTER — Encounter (HOSPITAL_COMMUNITY): Payer: Self-pay | Admitting: Emergency Medicine

## 2013-06-06 DIAGNOSIS — K029 Dental caries, unspecified: Secondary | ICD-10-CM | POA: Insufficient documentation

## 2013-06-06 DIAGNOSIS — Z791 Long term (current) use of non-steroidal anti-inflammatories (NSAID): Secondary | ICD-10-CM | POA: Insufficient documentation

## 2013-06-06 DIAGNOSIS — Z792 Long term (current) use of antibiotics: Secondary | ICD-10-CM | POA: Insufficient documentation

## 2013-06-06 DIAGNOSIS — K0889 Other specified disorders of teeth and supporting structures: Secondary | ICD-10-CM

## 2013-06-06 DIAGNOSIS — K089 Disorder of teeth and supporting structures, unspecified: Secondary | ICD-10-CM | POA: Insufficient documentation

## 2013-06-06 DIAGNOSIS — F17200 Nicotine dependence, unspecified, uncomplicated: Secondary | ICD-10-CM | POA: Insufficient documentation

## 2013-06-06 DIAGNOSIS — Z79899 Other long term (current) drug therapy: Secondary | ICD-10-CM | POA: Insufficient documentation

## 2013-06-06 DIAGNOSIS — Z880 Allergy status to penicillin: Secondary | ICD-10-CM | POA: Insufficient documentation

## 2013-06-06 DIAGNOSIS — Z8781 Personal history of (healed) traumatic fracture: Secondary | ICD-10-CM | POA: Insufficient documentation

## 2013-06-06 DIAGNOSIS — Z8669 Personal history of other diseases of the nervous system and sense organs: Secondary | ICD-10-CM | POA: Insufficient documentation

## 2013-06-06 DIAGNOSIS — Z8659 Personal history of other mental and behavioral disorders: Secondary | ICD-10-CM | POA: Insufficient documentation

## 2013-06-06 DIAGNOSIS — Z8673 Personal history of transient ischemic attack (TIA), and cerebral infarction without residual deficits: Secondary | ICD-10-CM | POA: Insufficient documentation

## 2013-06-06 MED ORDER — OXYCODONE-ACETAMINOPHEN 5-325 MG PO TABS
2.0000 | ORAL_TABLET | Freq: Once | ORAL | Status: AC
  Administered 2013-06-06: 2 via ORAL
  Filled 2013-06-06: qty 2

## 2013-06-06 MED ORDER — CLINDAMYCIN HCL 150 MG PO CAPS: 300.0000 mg | ORAL_CAPSULE | Freq: Three times a day (TID) | ORAL | Status: DC

## 2013-06-06 MED ORDER — IBUPROFEN 800 MG PO TABS: 800.0000 mg | ORAL_TABLET | Freq: Three times a day (TID) | ORAL | Status: DC

## 2013-06-06 MED ORDER — OXYCODONE-ACETAMINOPHEN 5-325 MG PO TABS: 1.0000 | ORAL_TABLET | ORAL | Status: DC | PRN

## 2013-06-06 MED ORDER — IBUPROFEN 400 MG PO TABS
800.0000 mg | ORAL_TABLET | Freq: Once | ORAL | Status: AC
  Administered 2013-06-06: 800 mg via ORAL
  Filled 2013-06-06: qty 2

## 2013-06-06 NOTE — Discharge Instructions (Signed)
Make follow up appointment with dentist as listed above in follow up section. Take pain medications as directed. Do not drive with prescription pain medication. Start antibiotic tonight and call for follow up with Dentist tomorrow. Resource guide provided below for further follow up.    Emergency Department Resource Guide 1) Find a Doctor and Pay Out of Pocket Although you won't have to find out who is covered by your insurance plan, it is a good idea to ask around and get recommendations. You will then need to call the office and see if the doctor you have chosen will accept you as a new patient and what types of options they offer for patients who are self-pay. Some doctors offer discounts or will set up payment plans for their patients who do not have insurance, but you will need to ask so you aren't surprised when you get to your appointment.  2) Contact Your Local Health Department Not all health departments have doctors that can see patients for sick visits, but many do, so it is worth a call to see if yours does. If you don't know where your local health department is, you can check in your phone book. The CDC also has a tool to help you locate your state's health department, and many state websites also have listings of all of their local health departments.  3) Find a [facility] If your illness is not likely to be very severe or complicated, you may want to try a walk in clinic. These are popping up all over the country in pharmacies, drugstores, and shopping centers. They're usually staffed by nurse practitioners or physician assistants that have been trained to treat common illnesses and complaints. They're usually fairly quick and inexpensive. However, if you have serious medical issues or chronic medical problems, these are probably not your best option.  No Primary Care Doctor: - Call Health Connect at  [phone] - they can help you locate a primary care doctor that  accepts your  insurance, provides certain services, etc. - Physician Referral Service- [phone]  Chronic Pain Problems: Organization         Address  Phone   Notes  [facility]  [phone] Patients need to be referred by their primary care doctor.   Medication Assistance: Organization         Address  Phone   Notes  [facility] Medication [facility] [address] [place], [place] [address] [phone] --Must be a resident of [facility] -- Must have NO insurance coverage whatsoever (no Medicaid/ Medicare, etc.) -- The pt. MUST have a primary care doctor that directs their care regularly and follows them in the community   MedAssist  [phone]   [employer]  [phone]    Agencies that provide inexpensive medical care: Organization         Address  Phone   Notes  [place]  [phone]   [facility]    [phone]   [facility] [place], [place] [address] [phone]   [place] [address] [place] [phone]   Planned Parenthood    [phone]   [facility]    [phone]   [address], [place] Phone:  [phone], Fax:  [phone] Hours of Operation:  9 am - 6 pm, M-F.  Also accepts Medicaid/Medicare and self-pay.  [facility] for [place], [address], [place] Phone: [phone], Fax: [phone]. Hours of Operation:  8:30 am - 5:30 pm, M-F.  Also accepts Medicaid and self-pay.  [facility] [place] [address], [place] Phone: [phone]   [place], [place], [place] [phone], Ext. [phone] Mondays & Thursdays: 7-9 AM.  First 15 patients are seen on a first come, first serve basis.    [place] Providers:  Organization          Address  Phone   Notes  [facility] [address], [place] [phone] Also accepts self-pay patients.  [facility] [address], [place]  [phone]   [place], [address], [place] [phone]   [facility] [address], [place] [phone]   Lucianne Lei [address], [place]   [phone] Only accepts [place] Access [place] patients after they have their name applied to their card.   Self-Pay (no insurance) in [facility]:  Organization         Address  Phone   Notes  Sickle Cell Patients, [facility] [place] [phone]   [facility] Urgent Care [place] [phone]   [facility] [place]  [place], [place], [place] [phone]   [facility]/Dr. Osei-Bonsu  [address] [place] or [address], [place] [phone] Phone number for both [place] and [place] locations is the same.  Urgent Medical and [facility] [address], [place] [phone]   [facility] [address], [place] or [address] [phone]   [facility] [address] [place] [phone], phone; [phone], fax Sees patients 1st and 3rd Saturday of every month.  Must not qualify for public or private insurance (i.e. Medicaid, Medicare, [place] Health Choice, Veterans' Benefits)  Household income should be no more than 200% of the poverty level The clinic cannot treat you if you are pregnant or think you are pregnant  Sexually transmitted diseases are not treated at the clinic.    Dental Care: Organization         Address  Phone  Notes  [facility] Department of [facility] [place] [phone] Accepts children up to age 40 who are enrolled in [place] or [place]; pregnant women with a Medicaid card; and children who have applied for Medicaid or [place] Health Choice, but were declined, whose parents can pay a reduced fee at time of service.  [facility] Department of [facility]  [address], [place] [phone] Accepts children up to age 10 who are enrolled in [place] or [place]; pregnant women with a Medicaid card; and children who have applied for Medicaid or [place] Health Choice, but were declined, whose parents can pay a reduced fee at time of service.  [place] Adult Dental Access PROGRAM  [place] [phone] Patients are seen by appointment only. Walk-ins are not accepted. [place] will see patients 12 years of age and older. Monday - Tuesday (  8am-5pm) Most Wednesdays (8:30-5pm) $30 per visit, cash only  [facility] Adult Dental Access PROGRAM  [address], [facility] [phone] Patients are seen by appointment only. Walk-ins are not accepted. [place] will see patients 35 years of age and older. One Wednesday Evening (Monthly: Volunteer Based).  $30 per visit, cash only  [place]  [phone] for adults; Children under age 38, call Graduate Pediatric Dentistry at [phone]. Children aged 78-14, please call [phone] to request a pediatric application.  Dental services are provided in all areas of dental care including fillings, crowns and bridges, complete and partial dentures, implants, gum treatment, root canals, and extractions. Preventive care is also provided. Treatment is provided to both adults and children. Patients are selected via a lottery and there is often a waiting list.   [facility] [address] [place]  [phone] [email or web address]   [facility] [address] [place], [place]  [phone], Ext. [phone] Second and Fourth Thursday of each month, opens at 6:30 AM; Clinic ends at 9 AM.  Patients are seen on a first-come first-served basis, and a limited number are seen during each clinic.   [facility]  [address] Hillard Danker [place], [place] [phone]   Eligibility Requirements You must have lived in [place], [place], or [place] counties for at least the last three months.   You cannot be eligible for state or federal sponsored [employer], including [employer], [place], or [employer].   You generally cannot be eligible for healthcare insurance through your employer.    How to apply: Eligibility screenings are held every Tuesday and Wednesday afternoon from 1:00 pm until 4:00 pm. You do not need an appointment for the interview!  [facility] [address] [place], [place]   [place]  [place] Department  [place]  [phone]    Behavioral Health Resources in the Community: Intensive Outpatient Programs Organization         Address  Phone  Notes  [place]. [address] [place], [place] [phone]   [facility] [address] [place], [place]   ADS: Alcohol & Drug Svcs [address], [place], [place]   [place] [address]  [place], [place] or [phone]   Substance Abuse Resources Organization         Address  Phone  Notes  Alcohol and Drug Services  [phone]   [place]  [phone]   [facility] [place]   [place]  [phone]   Residential & Outpatient Substance Abuse Program  [phone]   Psychological Services Organization         Address  Phone  Notes  [facility] [place]  [place]  [phone]    [place] [address], [place] or [phone]    Mobile Crisis Teams Organization         Address  Phone  Notes  [facility], Mobile Crisis Care Unit  [phone]   [facility]ive Psychotherapeutic Services  [address] [place], [place]   [place] [address] [place] [phone]    Self-Help/Support Groups Organization         Address  Phone  Notes  Mental Health Assoc. of [place] - variety of support groups  [place] Call for more information  Narcotics Anonymous (NA), Caring Services [address], [employer] [place]  2 meetings at this location   Special educational needs teacher         Address  Phone  Notes  [facility]idential Treatment [place],    [place]  [phone]   [facility]  [address], [place] [address], [place], [place]   [place], [place] [phone] Admissions: 8am-3pm M-F  Incentives Substance [place] [address],    [place], [place] [phone]   [facility] [address] [place], [place], [place]   [facility] [address]  [place], [place]   [facility]ight Programs - Intensive Outpatient [address], [place], [place]   [facility] ([place].,  [place], [place] [phone] or [phone]   [facility]idential Treatment Services (RTS) [address] [place], [place] Accepts Medicaid  [facility] [address],  [place] [phone] Substance Abuse/Addiction Treatment   [facility] Organization         Address  Phone  Notes  [facility]  [phone]   Domenic Schwab, PhD [address] Arlis [place], [place]   [phone] or [phone]   [place]  [place], [place] [phone]   [facility] [address], [place], [place] [phone] Insurance/Medicaid/sponsorship through [facility] and Families [address], [address] [place]                                    [place], [place] [phone] [place] [address], [place] [phone]    Dr. Adele Schilder  [phone]   [facility] Dept. 1) [address] [place] 2) [place] 3)  [place] [address], [place] [phone]  [phone]   [place] [phone] or [phone] (After Hours)

## 2013-06-06 NOTE — ED Notes (Signed)
Pt states intermittent dental pain. Pt states that she couldn't get meds filled last time due to not having insurance. Pt states that she needs a reference for a dentist and psychiatrist because she has been off all of her medications.
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
## 2013-06-16 ENCOUNTER — Emergency Department (HOSPITAL_COMMUNITY)
Admission: EM | Admit: 2013-06-16 | Discharge: 2013-06-16 | Disposition: A | Discharge status: Home / Self Care | Payer: Medicaid Other | Attending: Emergency Medicine | Admitting: Emergency Medicine

## 2013-06-16 ENCOUNTER — Encounter (HOSPITAL_COMMUNITY): Payer: Self-pay | Admitting: Emergency Medicine

## 2013-06-16 DIAGNOSIS — Z791 Long term (current) use of non-steroidal anti-inflammatories (NSAID): Secondary | ICD-10-CM | POA: Insufficient documentation

## 2013-06-16 DIAGNOSIS — F17200 Nicotine dependence, unspecified, uncomplicated: Secondary | ICD-10-CM | POA: Insufficient documentation

## 2013-06-16 DIAGNOSIS — Z8669 Personal history of other diseases of the nervous system and sense organs: Secondary | ICD-10-CM | POA: Insufficient documentation

## 2013-06-16 DIAGNOSIS — Z8659 Personal history of other mental and behavioral disorders: Secondary | ICD-10-CM | POA: Insufficient documentation

## 2013-06-16 DIAGNOSIS — Z880 Allergy status to penicillin: Secondary | ICD-10-CM | POA: Insufficient documentation

## 2013-06-16 DIAGNOSIS — L509 Urticaria, unspecified: Secondary | ICD-10-CM

## 2013-06-16 DIAGNOSIS — Z79899 Other long term (current) drug therapy: Secondary | ICD-10-CM | POA: Insufficient documentation

## 2013-06-16 DIAGNOSIS — Z792 Long term (current) use of antibiotics: Secondary | ICD-10-CM | POA: Insufficient documentation

## 2013-06-16 DIAGNOSIS — Z8673 Personal history of transient ischemic attack (TIA), and cerebral infarction without residual deficits: Secondary | ICD-10-CM | POA: Insufficient documentation

## 2013-06-16 MED ORDER — PREDNISONE 50 MG PO TABS: 50.0000 mg | ORAL_TABLET | Freq: Every day | ORAL | Status: DC

## 2013-06-16 MED ORDER — METHYLPREDNISOLONE SODIUM SUCC 125 MG IJ SOLR
125.0000 mg | Freq: Once | INTRAMUSCULAR | Status: AC
  Administered 2013-06-16: 125 mg via INTRAVENOUS
  Filled 2013-06-16: qty 2

## 2013-06-16 MED ORDER — HYDROXYZINE HCL 25 MG PO TABS: 25.0000 mg | ORAL_TABLET | Freq: Four times a day (QID) | ORAL | Status: DC

## 2013-06-16 MED ORDER — FAMOTIDINE 20 MG PO TABS: 20.0000 mg | ORAL_TABLET | Freq: Two times a day (BID) | ORAL | Status: DC

## 2013-06-16 MED ORDER — SODIUM CHLORIDE 0.9 % IV SOLN
Freq: Once | INTRAVENOUS | Status: AC
  Administered 2013-06-16: 500 mL/h via INTRAVENOUS

## 2013-06-16 MED ORDER — FAMOTIDINE IN NACL 20-0.9 MG/50ML-% IV SOLN
20.0000 mg | Freq: Once | INTRAVENOUS | Status: AC
  Administered 2013-06-16: 20 mg via INTRAVENOUS
  Filled 2013-06-16: qty 50

## 2013-06-16 MED ORDER — DIPHENHYDRAMINE HCL 50 MG/ML IJ SOLN
25.0000 mg | Freq: Once | INTRAMUSCULAR | Status: AC
  Administered 2013-06-16: 25 mg via INTRAVENOUS
  Filled 2013-06-16: qty 1

## 2013-06-16 MED ORDER — LORAZEPAM 1 MG PO TABS
1.0000 mg | ORAL_TABLET | Freq: Once | ORAL | Status: AC
  Administered 2013-06-16: 1 mg via ORAL
  Filled 2013-06-16: qty 2

## 2013-06-16 NOTE — ED Notes (Signed)
Pt reports itching is increasing.  PA aware, orders obtained.

## 2013-06-16 NOTE — ED Notes (Signed)
Case manager here at the bedside
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
## 2013-06-16 NOTE — ED Notes (Signed)
Case manager spoke with pt. And is waiting for the social worker.

## 2013-06-16 NOTE — ED Notes (Signed)
Pt states itching has decreased and is tolerable at this time.
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
## 2013-06-16 NOTE — ED Notes (Signed)
Pt. Stated, I've had a rash for 3 days . Its all over and Im itching.

## 2013-06-16 NOTE — Discharge Instructions (Signed)
Return here as needed. Follow up with a primary doctor. °
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
## 2013-06-16 NOTE — ED Notes (Addendum)
Pt presents with a macular rash to upper and lower extremities X 2 days. Pt denies any known contact. Condition is acute in nature. Condition is made worse by "everything" condition is made better by nothing. Pt denies relief from benadryl or hydrocortozone cream. Pt denies any family members with similar s/s. Pt denies any difficulty with airway. Able to speak in full sentences at time of triage
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
## 2013-06-18 NOTE — ED Provider Notes (Signed)
Medical screening examination/treatment/procedure(s) were performed by non-physician practitioner and as supervising physician I was immediately available for consultation/collaboration.   Dot Lanes, MD 06/18/13 1116

## 2013-06-18 NOTE — ED Provider Notes (Signed)
Medical screening examination/treatment/procedure(s) were performed by non-physician practitioner and as supervising physician I was immediately available for consultation/collaboration.   EKG Interpretation None        Alfonzo Feller, DO 06/18/13 2112

## 2013-11-21 DIAGNOSIS — Z8639 Personal history of other endocrine, nutritional and metabolic disease: Secondary | ICD-10-CM | POA: Insufficient documentation

## 2014-08-30 ENCOUNTER — Emergency Department (HOSPITAL_COMMUNITY): Payer: Medicaid Other

## 2014-08-30 ENCOUNTER — Encounter (HOSPITAL_COMMUNITY): Payer: Self-pay | Admitting: Emergency Medicine

## 2014-08-30 ENCOUNTER — Observation Stay (HOSPITAL_COMMUNITY)
Admission: EM | Admit: 2014-08-30 | Discharge: 2014-09-02 | Disposition: A | Discharge status: Home / Self Care | Payer: Medicaid Other | Attending: Internal Medicine | Admitting: Internal Medicine

## 2014-08-30 DIAGNOSIS — Z8673 Personal history of transient ischemic attack (TIA), and cerebral infarction without residual deficits: Secondary | ICD-10-CM | POA: Insufficient documentation

## 2014-08-30 DIAGNOSIS — F203 Undifferentiated schizophrenia: Secondary | ICD-10-CM | POA: Diagnosis present

## 2014-08-30 DIAGNOSIS — F1490 Cocaine use, unspecified, uncomplicated: Secondary | ICD-10-CM | POA: Diagnosis not present

## 2014-08-30 DIAGNOSIS — F17210 Nicotine dependence, cigarettes, uncomplicated: Secondary | ICD-10-CM | POA: Diagnosis not present

## 2014-08-30 DIAGNOSIS — Z91011 Allergy to milk products: Secondary | ICD-10-CM | POA: Diagnosis not present

## 2014-08-30 DIAGNOSIS — Z881 Allergy status to other antibiotic agents status: Secondary | ICD-10-CM | POA: Insufficient documentation

## 2014-08-30 DIAGNOSIS — D509 Iron deficiency anemia, unspecified: Secondary | ICD-10-CM | POA: Diagnosis not present

## 2014-08-30 DIAGNOSIS — Z880 Allergy status to penicillin: Secondary | ICD-10-CM | POA: Insufficient documentation

## 2014-08-30 DIAGNOSIS — I10 Essential (primary) hypertension: Secondary | ICD-10-CM | POA: Diagnosis not present

## 2014-08-30 DIAGNOSIS — F1410 Cocaine abuse, uncomplicated: Secondary | ICD-10-CM | POA: Diagnosis not present

## 2014-08-30 DIAGNOSIS — F209 Schizophrenia, unspecified: Secondary | ICD-10-CM | POA: Diagnosis not present

## 2014-08-30 DIAGNOSIS — F40240 Claustrophobia: Secondary | ICD-10-CM | POA: Diagnosis not present

## 2014-08-30 DIAGNOSIS — R51 Headache: Secondary | ICD-10-CM

## 2014-08-30 DIAGNOSIS — R519 Headache, unspecified: Secondary | ICD-10-CM

## 2014-08-30 DIAGNOSIS — R479 Unspecified speech disturbances: Secondary | ICD-10-CM

## 2014-08-30 DIAGNOSIS — R531 Weakness: Secondary | ICD-10-CM | POA: Diagnosis not present

## 2014-08-30 DIAGNOSIS — Z8669 Personal history of other diseases of the nervous system and sense organs: Secondary | ICD-10-CM

## 2014-08-30 DIAGNOSIS — F439 Reaction to severe stress, unspecified: Secondary | ICD-10-CM

## 2014-08-30 DIAGNOSIS — R739 Hyperglycemia, unspecified: Secondary | ICD-10-CM | POA: Diagnosis present

## 2014-08-30 DIAGNOSIS — Z7151 Drug abuse counseling and surveillance of drug abuser: Secondary | ICD-10-CM | POA: Insufficient documentation

## 2014-08-30 DIAGNOSIS — F200 Paranoid schizophrenia: Secondary | ICD-10-CM | POA: Diagnosis present

## 2014-08-30 DIAGNOSIS — M6289 Other specified disorders of muscle: Secondary | ICD-10-CM

## 2014-08-30 DIAGNOSIS — Z7982 Long term (current) use of aspirin: Secondary | ICD-10-CM

## 2014-08-30 LAB — ETHANOL: Alcohol, Ethyl (B): <5 mg/dL (ref <5)

## 2014-08-30 LAB — PROTIME-INR
INR: 1.06 (ref 0.00–1.49)
Prothrombin Time: 14 seconds (ref 11.6–15.2)

## 2014-08-30 LAB — URINE MICROSCOPIC-ADD ON

## 2014-08-30 LAB — DIFFERENTIAL
Basophils Absolute: 0.1 10*3/uL (ref 0.0–0.1)
Basophils Relative: 1 % (ref 0–1)
EOS PCT: 2 % (ref 0–5)
Eosinophils Absolute: 0.1 10*3/uL (ref 0.0–0.7)
LYMPHS ABS: 2.1 10*3/uL (ref 0.7–4.0)
Lymphocytes Relative: 28 % (ref 12–46)
MONOS PCT: 11 % (ref 3–12)
Monocytes Absolute: 0.8 10*3/uL (ref 0.1–1.0)
Neutro Abs: 4.3 10*3/uL (ref 1.7–7.7)
Neutrophils Relative %: 58 % (ref 43–77)

## 2014-08-30 LAB — I-STAT CHEM 8, ED
BUN: 12 mg/dL (ref 6–20)
Calcium, Ion: 1.16 mmol/L (ref 1.12–1.23)
Chloride: 105 mmol/L (ref 101–111)
Creatinine, Ser: 0.7 mg/dL (ref 0.44–1.00)
Glucose, Bld: 104 mg/dL — ABNORMAL HIGH (ref 65–99)
HCT: 39 % (ref 36.0–46.0)
Hemoglobin: 13.3 g/dL (ref 12.0–15.0)
POTASSIUM: 3.3 mmol/L — AB (ref 3.5–5.1)
Sodium: 140 mmol/L (ref 135–145)
TCO2: 20 mmol/L (ref 0–100)

## 2014-08-30 LAB — RAPID URINE DRUG SCREEN, HOSP PERFORMED
Amphetamines: NOT DETECTED
Barbiturates: NOT DETECTED
Benzodiazepines: NOT DETECTED
Cocaine: POSITIVE — AB
OPIATES: NOT DETECTED
TETRAHYDROCANNABINOL: NOT DETECTED

## 2014-08-30 LAB — URINALYSIS, ROUTINE W REFLEX MICROSCOPIC
Bilirubin Urine: NEGATIVE
Glucose, UA: NEGATIVE mg/dL
Hgb urine dipstick: NEGATIVE
KETONES UR: NEGATIVE mg/dL
LEUKOCYTES UA: NEGATIVE
NITRITE: NEGATIVE
Protein, ur: NEGATIVE mg/dL
SPECIFIC GRAVITY, URINE: 1.012 (ref 1.005–1.030)
UROBILINOGEN UA: 0.2 mg/dL (ref 0.0–1.0)
pH: 8.5 — ABNORMAL HIGH (ref 5.0–8.0)

## 2014-08-30 LAB — COMPREHENSIVE METABOLIC PANEL
ALT: 15 U/L (ref 14–54)
AST: 20 U/L (ref 15–41)
Albumin: 3.9 g/dL (ref 3.5–5.0)
Alkaline Phosphatase: 75 U/L (ref 38–126)
Anion gap: 11 (ref 5–15)
BUN: 10 mg/dL (ref 6–20)
CALCIUM: 9.1 mg/dL (ref 8.9–10.3)
CO2: 21 mmol/L — ABNORMAL LOW (ref 22–32)
Chloride: 105 mmol/L (ref 101–111)
Creatinine, Ser: 0.73 mg/dL (ref 0.44–1.00)
GFR calc non Af Amer: >60 mL/min (ref >60)
GLUCOSE: 102 mg/dL — AB (ref 65–99)
Potassium: 3.3 mmol/L — ABNORMAL LOW (ref 3.5–5.1)
Sodium: 137 mmol/L (ref 135–145)
Total Bilirubin: 0.4 mg/dL (ref 0.3–1.2)
Total Protein: 7.7 g/dL (ref 6.5–8.1)

## 2014-08-30 LAB — CBC
HCT: 33.5 % — ABNORMAL LOW (ref 36.0–46.0)
HEMOGLOBIN: 11 g/dL — AB (ref 12.0–15.0)
MCH: 26.5 pg (ref 26.0–34.0)
MCHC: 32.8 g/dL (ref 30.0–36.0)
MCV: 80.7 fL (ref 78.0–100.0)
Platelets: 401 10*3/uL — ABNORMAL HIGH (ref 150–400)
RBC: 4.15 MIL/uL (ref 3.87–5.11)
RDW: 14.8 % (ref 11.5–15.5)
WBC: 7.3 10*3/uL (ref 4.0–10.5)

## 2014-08-30 LAB — APTT: aPTT: 27 seconds (ref 24–37)

## 2014-08-30 LAB — CBG MONITORING, ED: Glucose-Capillary: 114 mg/dL — ABNORMAL HIGH (ref 65–99)

## 2014-08-30 LAB — I-STAT TROPONIN, ED: Troponin i, poc: 0 ng/mL (ref 0.00–0.08)

## 2014-08-30 MED ORDER — KETOROLAC TROMETHAMINE 30 MG/ML IJ SOLN
30.0000 mg | Freq: Once | INTRAMUSCULAR | Status: AC
  Administered 2014-08-30: 30 mg via INTRAVENOUS
  Filled 2014-08-30: qty 1

## 2014-08-30 MED ORDER — METOCLOPRAMIDE HCL 5 MG/ML IJ SOLN
10.0000 mg | Freq: Once | INTRAMUSCULAR | Status: AC
  Administered 2014-08-30: 10 mg via INTRAVENOUS
  Filled 2014-08-30: qty 2

## 2014-08-30 MED ORDER — LORAZEPAM 2 MG/ML IJ SOLN
1.0000 mg | Freq: Once | INTRAMUSCULAR | Status: AC
  Administered 2014-08-30: 1 mg via INTRAVENOUS
  Filled 2014-08-30: qty 1

## 2014-08-30 MED ORDER — ALPRAZOLAM 0.25 MG PO TABS
0.2500 mg | ORAL_TABLET | Freq: Once | ORAL | Status: AC
  Administered 2014-08-30: 0.25 mg via ORAL
  Filled 2014-08-30: qty 1

## 2014-08-30 MED ORDER — POTASSIUM CHLORIDE CRYS ER 20 MEQ PO TBCR
40.0000 meq | EXTENDED_RELEASE_TABLET | Freq: Once | ORAL | Status: AC
  Administered 2014-08-30: 40 meq via ORAL
  Filled 2014-08-30: qty 2

## 2014-08-30 MED ORDER — SODIUM CHLORIDE 0.9 % IV BOLUS (SEPSIS)
1000.0000 mL | Freq: Once | INTRAVENOUS | Status: AC
  Administered 2014-08-30: 1000 mL via INTRAVENOUS

## 2014-08-30 MED ORDER — ACETAMINOPHEN 325 MG PO TABS
650.0000 mg | ORAL_TABLET | Freq: Four times a day (QID) | ORAL | Status: DC | PRN
  Administered 2014-08-30 – 2014-08-31 (×3): 650 mg via ORAL
  Filled 2014-08-30 (×3): qty 2

## 2014-08-30 MED ORDER — ASPIRIN EC 325 MG PO TBEC
325.0000 mg | DELAYED_RELEASE_TABLET | Freq: Once | ORAL | Status: AC
  Administered 2014-08-30: 325 mg via ORAL
  Filled 2014-08-30: qty 1

## 2014-08-30 MED ORDER — ENOXAPARIN SODIUM 40 MG/0.4ML ~~LOC~~ SOLN: 40.0000 mg | SUBCUTANEOUS | Status: DC

## 2014-08-30 MED ORDER — ENOXAPARIN SODIUM 40 MG/0.4ML ~~LOC~~ SOLN
40.0000 mg | SUBCUTANEOUS | Status: DC
  Administered 2014-08-30 – 2014-09-01 (×3): 40 mg via SUBCUTANEOUS
  Filled 2014-08-30 (×3): qty 0.4

## 2014-08-30 MED ORDER — SODIUM CHLORIDE 0.9 % IJ SOLN
3.0000 mL | Freq: Two times a day (BID) | INTRAMUSCULAR | Status: DC
  Administered 2014-08-30 – 2014-09-01 (×3): 3 mL via INTRAVENOUS

## 2014-08-30 MED ORDER — DIPHENHYDRAMINE HCL 50 MG/ML IJ SOLN
25.0000 mg | Freq: Once | INTRAMUSCULAR | Status: AC
  Administered 2014-08-30: 25 mg via INTRAVENOUS
  Filled 2014-08-30: qty 1

## 2014-08-30 MED ORDER — DEXAMETHASONE SODIUM PHOSPHATE 10 MG/ML IJ SOLN
10.0000 mg | Freq: Once | INTRAMUSCULAR | Status: AC
  Administered 2014-08-30: 10 mg via INTRAVENOUS
  Filled 2014-08-30: qty 1

## 2014-08-30 MED ORDER — ACETAMINOPHEN 650 MG RE SUPP: 650.0000 mg | Freq: Four times a day (QID) | RECTAL | Status: DC | PRN

## 2014-08-30 NOTE — Progress Notes (Signed)
Patient arrived to 4N23 from ED at 1850. Oriented to room with bed alarm on.
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
## 2014-08-30 NOTE — ED Provider Notes (Signed)
CSN: [id number]     Arrival date & time 08/30/14  1130 History   First MD Initiated Contact with Patient 08/30/14 1138     Chief Complaint  Patient presents with  . Code Stroke     (Consider location/radiation/quality/duration/timing/severity/associated sxs/prior Treatment) HPI  Pt is a 49yo female with hx of CVA, seizures, and schizophrenia, presenting to ED with c/o Left sided weakness that started suddenly earlier this morning. Pt states she has been "arguing" with her significant other for 2 days.  Pt also c/o Left sided headache and Left arm pain.  Headache is 8/10, aching and throbbing.  Left arm pain is aching and sore.  States she cannot move her left arm or leg.  Denies taking medication PTA. Denies fever, chills, n/v/d. Denies recent head injury. Pt denies SI/HI.  Past Medical History  Diagnosis Date  . CVA (cerebral infarction)   . Seizures   . Schizophrenia    Past Surgical History  Procedure Laterality Date  . Limb sparing resection hip w/ saddle joint replacement     Family History  Problem Relation Age of Onset  . Seizures Mother   . Hypertension Mother    History  Substance Use Topics  . Smoking status: Current Every Day Smoker -- 0.50 packs/day    Types: Cigarettes  . Smokeless tobacco: Not on file  . Alcohol Use: No   OB History    No data available     Review of Systems  Constitutional: Positive for fatigue. Negative for fever, chills, diaphoresis and appetite change.  Respiratory: Negative for shortness of breath.   Cardiovascular: Negative for chest pain, palpitations and leg swelling.  Gastrointestinal: Negative for nausea, vomiting, abdominal pain and diarrhea.  Skin: Negative for rash and wound.  Neurological: Positive for weakness (Left side), numbness (Left arm and leg ) and headaches (Left side). Negative for dizziness, seizures, syncope and light-headedness.  All other systems reviewed and are negative.     Allergies  Keflex; Penicillins;  and Lactose intolerance (gi)  Home Medications   Prior to Admission medications   Medication Sig Start Date End Date Taking? Authorizing Provider  ibuprofen (ADVIL,MOTRIN) 400 MG tablet Take 400 mg by mouth every 6 (six) hours as needed for mild pain.   Yes Historical Provider, MD   BP 132/85 mmHg  Pulse 79  Temp(Src) 98.2 F (36.8 C) (Oral)  Resp 16  SpO2 100% Physical Exam  Constitutional: She is oriented to person, place, and time. She appears well-developed and well-nourished. No distress.  Pt lying comfortably in exam bed texting on her cellphone, NAD.  HENT:  Head: Normocephalic and atraumatic.  Eyes: Conjunctivae are normal. No scleral icterus.  Neck: Normal range of motion. Neck supple.  Cardiovascular: Normal rate, regular rhythm and normal heart sounds.   Pulmonary/Chest: Effort normal and breath sounds normal. No respiratory distress. She has no wheezes. She has no rales. She exhibits no tenderness.  Abdominal: Soft. Bowel sounds are normal. She exhibits no distension and no mass. There is no tenderness. There is no rebound and no guarding.  Musculoskeletal: Normal range of motion.  Neurological: She is alert and oriented to person, place, and time. She displays no atrophy and no tremor. A cranial nerve deficit and sensory deficit is present. She exhibits normal muscle tone. She displays no seizure activity.  Alert to person, place and time, speech is clear. No left side facial droop, however asymmetric smile, unable to puff both cheeks at same time, unable to stick out  her tongue as pt does not appear to open mouth fully.  EOM in tact. Unable to move left arm or leg. 3/5 grip strength.  When Left arm raised above head, pt "drops" left arm by her side, does not fall straight down.   Skin: Skin is warm and dry. She is not diaphoretic.  Nursing note and vitals reviewed.   ED Course  Procedures (including critical care time) Labs Review Labs Reviewed  CBC - Abnormal;  Notable for the following:    Hemoglobin 11.0 (*)    HCT 33.5 (*)    Platelets 401 (*)    All other components within normal limits  COMPREHENSIVE METABOLIC PANEL - Abnormal; Notable for the following:    Potassium 3.3 (*)    CO2 21 (*)    Glucose, Bld 102 (*)    All other components within normal limits  URINE RAPID DRUG SCREEN (HOSP PERFORMED) NOT AT [facility] - Abnormal; Notable for the following:    Cocaine POSITIVE (*)    All other components within normal limits  URINALYSIS, ROUTINE W REFLEX MICROSCOPIC (NOT AT [facility]) - Abnormal; Notable for the following:    APPearance TURBID (*)    pH 8.5 (*)    All other components within normal limits  URINE MICROSCOPIC-ADD ON - Abnormal; Notable for the following:    Squamous Epithelial / LPF MANY (*)    All other components within normal limits  I-STAT CHEM 8, ED - Abnormal; Notable for the following:    Potassium 3.3 (*)    Glucose, Bld 104 (*)    All other components within normal limits  CBG MONITORING, ED - Abnormal; Notable for the following:    Glucose-Capillary 114 (*)    All other components within normal limits  ETHANOL  PROTIME-INR  APTT  DIFFERENTIAL  I-STAT TROPOININ, ED    Imaging Review Ct Head Wo Contrast  08/30/2014   CLINICAL DATA:  Initial encounter for code stroke  EXAM: CT HEAD WITHOUT CONTRAST  TECHNIQUE: Contiguous axial images were obtained from the base of the skull through the vertex without intravenous contrast.  COMPARISON:  05/03/2014.  FINDINGS: There is no evidence for acute hemorrhage, hydrocephalus, mass lesion, or abnormal extra-axial fluid collection. No definite CT evidence for acute infarction. The visualized paranasal sinuses and mastoid air cells are clear.  IMPRESSION: Normal CT exam head.  I discussed with Dr. Nicole Kindred at approximately 11:50 a.m. on 08/30/2014.   Electronically Signed   By: Misty Stanley M.D.   On: 08/30/2014 11:55     EKG Interpretation   Date/Time:  Friday August 30 2014 11:51:00  EDT Ventricular Rate:  69 PR Interval:  133 QRS Duration: 74 QT Interval:  436 QTC Calculation: 467 R Axis:   40 Text Interpretation:  Sinus rhythm No significant change since last  tracing Confirmed by Maryan Rued  MD, Loree Fee ([id number]) on 08/30/2014 12:29:09 PM      MDM   Final diagnoses:  Left-sided headache  Left-sided weakness  Stress at home  Cocaine use  Weakness   Pt is a 49yo female presenting to ED with Left sided weakness starting after 2 days of "arguing" per pt. No recent injury.  11:46 AM Consulted with Dr. Nicole Kindred, neurology, who has examined pt.  Doubt stroke, no focal neuro deficits.  Symptoms likely emotional in nature.  Will give 1mg  ativan IV. CT head and labs still pending.   After review of labs and medication, will attempt to ambulate pt.   3:37 PM  pt states HA has resolved after IV meds and a nap. Pt requesting something to eat. Will also ambulate pt and likely discharge home soon.   4:44 PM Upon discharge, pt is unable to ambulate w/o assistance. Both legs appear to "give way"   Dr. Wyvonnia Dusky consulted with Dr. Nicole Kindred again, recommends medicine admission as pt cannot ambulate, will also get MRI brain.  Consulted with teaching service, will place temporary admission orders for med-surg observation, may change pending MRI results.  May consider psychology consult, however, pt does deny SI/HI.     Noland Fordyce, PA-C 08/30/14 1656  Blanchie Dessert, MD 08/30/14 2132

## 2014-08-30 NOTE — Consult Note (Signed)
Admission H&P    Chief Complaint: New onset speech difficulty and left-sided weakness.  HPI: Joanne Gonzalez is an 49 y.o. female with a history of stroke, hypertension, seizures and schizophrenia presenting with new onset speech output difficulty as well as weakness involving her left side. Patient reportedly woke up with these deficits about 7 AM today. Deficits were not present when she went to bed at 4 AM. She has a history of hypertension but has not been taking anti-hypertensive medication. Blood pressure was not elevated in the ED. CT scan of her head showed no acute intracranial abnormality. Patient was noted to be tearful as well as somewhat agitated. Speech pattern was non-organic and patient had inconsistent findings regarding left extremities with very poor effort. She had no facial weakness. Complaints were felt to be psychogenic and code stroke was canceled.  Past Medical History  Diagnosis Date  . CVA (cerebral infarction)   . Seizures   . Schizophrenia     Past Surgical History  Procedure Laterality Date  . Limb sparing resection hip w/ saddle joint replacement      Family History  Problem Relation Age of Onset  . Seizures Mother   . Hypertension Mother    Social History:  reports that she has been smoking Cigarettes.  She has been smoking about 0.50 packs per day. She does not have any smokeless tobacco history on file. She reports that she does not drink alcohol or use illicit drugs.  Allergies:  Allergies  Allergen Reactions  . Keflex [Cephalexin] Hives  . Penicillins Hives  . Lactose Intolerance (Gi) Nausea And Vomiting and Rash   Medications: Ibuprofen 40 mg every 6 hours when necessary for pain  ROS: History obtained from chart review and the patient  General ROS: negative for - chills, fatigue, fever, night sweats, weight gain or weight loss Psychological ROS: negative for - behavioral disorder, hallucinations, memory difficulties, mood swings or suicidal  ideation Ophthalmic ROS: negative for - blurry vision, double vision, eye pain or loss of vision ENT ROS: negative for - epistaxis, nasal discharge, oral lesions, sore throat, tinnitus or vertigo Allergy and Immunology ROS: negative for - hives or itchy/watery eyes Hematological and Lymphatic ROS: negative for - bleeding problems, bruising or swollen lymph nodes Endocrine ROS: negative for - galactorrhea, hair pattern changes, polydipsia/polyuria or temperature intolerance Respiratory ROS: negative for - cough, hemoptysis, shortness of breath or wheezing Cardiovascular ROS: negative for - chest pain, dyspnea on exertion, edema or irregular heartbeat Gastrointestinal ROS: negative for - abdominal pain, diarrhea, hematemesis, nausea/vomiting or stool incontinence Genito-Urinary ROS: negative for - dysuria, hematuria, incontinence or urinary frequency/urgency Musculoskeletal ROS: negative for - joint swelling or muscular weakness Neurological ROS: as noted in HPI Dermatological ROS: negative for rash and skin lesion changes  Physical Examination: Blood pressure 122/101, pulse 74, temperature 98 F (36.7 C), temperature source Oral, resp. rate 19, SpO2 96 %.  HEENT-  Normocephalic, no lesions, without obvious abnormality.  Normal external eye and conjunctiva.  Normal TM's bilaterally.  Normal auditory canals and external ears. Normal external nose, mucus membranes and septum.  Normal pharynx. Neck supple with no masses, nodes, nodules or enlargement. Cardiovascular - regular rate and rhythm, S1, S2 normal, no murmur, click, rub or gallop Lungs - chest clear, no wheezing, rales, normal symmetric air entry Abdomen - soft, non-tender; bowel sounds normal; no masses,  no organomegaly Extremities - no joint deformities, effusion, or inflammation, no edema and no skin discoloration  Neurologic Examination: Mental Status:  Alert, oriented to correct age, equivocal regarding current month, moderately  agitated and tearful.  Speech was somewhat telegraphic and pattern and inconsistent. Able to follow commands without difficulty. Cranial Nerves: II-Visual fields were normal. III/IV/VI-Pupils were equal and reacted normally to light. Extraocular movements were full and conjugate.    V/VII-no facial numbness and no facial weakness when distracted. VIII-normal. X-no clear dysarthria. XI: trapezius strength/neck flexion strength normal bilaterally XII-midline tongue extension with normal strength. Motor: 5/5 bilaterally with normal tone and bulk Sensory: Normal throughout. Deep Tendon Reflexes: 2+ and symmetric. Plantars: Flexor bilaterally Cerebellar: Normal finger-to-nose testing. Carotid auscultation: Normal  Results for orders placed or performed during the hospital encounter of 08/30/14 (from the past 48 hour(s))  Ethanol     Status: None   Collection Time: 08/30/14 11:32 AM  Result Value Ref Range   Alcohol, Ethyl (B) <5 <5 mg/dL    Comment:        LOWEST DETECTABLE LIMIT FOR SERUM ALCOHOL IS 11 mg/dL FOR MEDICAL PURPOSES ONLY   Protime-INR     Status: None   Collection Time: 08/30/14 11:32 AM  Result Value Ref Range   Prothrombin Time 14.0 11.6 - 15.2 seconds   INR 1.06 0.00 - 1.49  APTT     Status: None   Collection Time: 08/30/14 11:32 AM  Result Value Ref Range   aPTT 27 24 - 37 seconds  CBC     Status: Abnormal   Collection Time: 08/30/14 11:32 AM  Result Value Ref Range   WBC 7.3 4.0 - 10.5 K/uL   RBC 4.15 3.87 - 5.11 MIL/uL   Hemoglobin 11.0 (L) 12.0 - 15.0 g/dL   HCT 33.5 (L) 36.0 - 46.0 %   MCV 80.7 78.0 - 100.0 fL   MCH 26.5 26.0 - 34.0 pg   MCHC 32.8 30.0 - 36.0 g/dL   RDW 14.8 11.5 - 15.5 %   Platelets 401 (H) 150 - 400 K/uL  Differential     Status: None   Collection Time: 08/30/14 11:32 AM  Result Value Ref Range   Neutrophils Relative % 58 43 - 77 %   Neutro Abs 4.3 1.7 - 7.7 K/uL   Lymphocytes Relative 28 12 - 46 %   Lymphs Abs 2.1 0.7 - 4.0  K/uL   Monocytes Relative 11 3 - 12 %   Monocytes Absolute 0.8 0.1 - 1.0 K/uL   Eosinophils Relative 2 0 - 5 %   Eosinophils Absolute 0.1 0.0 - 0.7 K/uL   Basophils Relative 1 0 - 1 %   Basophils Absolute 0.1 0.0 - 0.1 K/uL  Comprehensive metabolic panel     Status: Abnormal   Collection Time: 08/30/14 11:32 AM  Result Value Ref Range   Sodium 137 135 - 145 mmol/L   Potassium 3.3 (L) 3.5 - 5.1 mmol/L   Chloride 105 101 - 111 mmol/L   CO2 21 (L) 22 - 32 mmol/L   Glucose, Bld 102 (H) 65 - 99 mg/dL   BUN 10 6 - 20 mg/dL   Creatinine, Ser 0.73 0.44 - 1.00 mg/dL   Calcium 9.1 8.9 - 10.3 mg/dL   Total Protein 7.7 6.5 - 8.1 g/dL   Albumin 3.9 3.5 - 5.0 g/dL   AST 20 15 - 41 U/L   ALT 15 14 - 54 U/L   Alkaline Phosphatase 75 38 - 126 U/L   Total Bilirubin 0.4 0.3 - 1.2 mg/dL   GFR calc non Af Amer >60 >60 mL/min  GFR calc Af Amer >60 >60 mL/min    Comment: (NOTE) The eGFR has been calculated using the CKD EPI equation. This calculation has not been validated in all clinical situations. eGFR's persistently <60 mL/min signify possible Chronic Kidney Disease.    Anion gap 11 5 - 15  I-stat troponin, ED (not at [facility], [facility])     Status: None   Collection Time: 08/30/14 11:35 AM  Result Value Ref Range   Troponin i, poc 0.00 0.00 - 0.08 ng/mL   Comment 3            Comment: Due to the release kinetics of cTnI, a negative result within the first hours of the onset of symptoms does not rule out myocardial infarction with certainty. If myocardial infarction is still suspected, repeat the test at appropriate intervals.   I-Stat Chem 8, ED  (not at [facility], [facility])     Status: Abnormal   Collection Time: 08/30/14 11:37 AM  Result Value Ref Range   Sodium 140 135 - 145 mmol/L   Potassium 3.3 (L) 3.5 - 5.1 mmol/L   Chloride 105 101 - 111 mmol/L   BUN 12 6 - 20 mg/dL   Creatinine, Ser 0.70 0.44 - 1.00 mg/dL   Glucose, Bld 104 (H) 65 - 99 mg/dL   Calcium, Ion 1.16 1.12 - 1.23 mmol/L   TCO2  20 0 - 100 mmol/L   Hemoglobin 13.3 12.0 - 15.0 g/dL   HCT 39.0 36.0 - 46.0 %  CBG monitoring, ED     Status: Abnormal   Collection Time: 08/30/14 11:38 AM  Result Value Ref Range   Glucose-Capillary 114 (H) 65 - 99 mg/dL   Ct Head Wo Contrast  08/30/2014   CLINICAL DATA:  Initial encounter for code stroke  EXAM: CT HEAD WITHOUT CONTRAST  TECHNIQUE: Contiguous axial images were obtained from the base of the skull through the vertex without intravenous contrast.  COMPARISON:  05/03/2014.  FINDINGS: There is no evidence for acute hemorrhage, hydrocephalus, mass lesion, or abnormal extra-axial fluid collection. No definite CT evidence for acute infarction. The visualized paranasal sinuses and mastoid air cells are clear.  IMPRESSION: Normal CT exam head.  I discussed with Dr. Nicole Kindred at approximately 11:50 a.m. on 08/30/2014.   Electronically Signed   By: Misty Stanley M.D.   On: 08/30/2014 11:55    Assessment/Plan 49 year old lady presenting with complaint of each output difficulty and left side weakness. Hyperventilation has no objective clinical clinical deficits. Presenting complaints are felt to be psychophysiologic in origin.  Recommendations: No further neurologic intervention is indicated, including no additional neurodiagnostic studies. Reassure patient that we are not finding any evidence of an acute stroke. If her complaints persist, recommend psychiatric evaluation.  C.R. Nicole Kindred, MD [facility] [phone]  08/30/2014, 1:05 PM
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
## 2014-08-31 DIAGNOSIS — F203 Undifferentiated schizophrenia: Secondary | ICD-10-CM | POA: Diagnosis present

## 2014-08-31 DIAGNOSIS — R531 Weakness: Secondary | ICD-10-CM

## 2014-08-31 DIAGNOSIS — F209 Schizophrenia, unspecified: Secondary | ICD-10-CM | POA: Diagnosis not present

## 2014-08-31 DIAGNOSIS — F1410 Cocaine abuse, uncomplicated: Secondary | ICD-10-CM

## 2014-08-31 DIAGNOSIS — F200 Paranoid schizophrenia: Secondary | ICD-10-CM | POA: Diagnosis not present

## 2014-08-31 DIAGNOSIS — D649 Anemia, unspecified: Secondary | ICD-10-CM | POA: Diagnosis not present

## 2014-08-31 LAB — BASIC METABOLIC PANEL
ANION GAP: 8 (ref 5–15)
BUN: 9 mg/dL (ref 6–20)
CALCIUM: 9.1 mg/dL (ref 8.9–10.3)
CHLORIDE: 106 mmol/L (ref 101–111)
CO2: 22 mmol/L (ref 22–32)
CREATININE: 0.67 mg/dL (ref 0.44–1.00)
GFR calc Af Amer: >60 mL/min (ref >60)
GLUCOSE: 104 mg/dL — AB (ref 65–99)
POTASSIUM: 3.9 mmol/L (ref 3.5–5.1)
Sodium: 136 mmol/L (ref 135–145)

## 2014-08-31 MED ORDER — HYDROXYZINE HCL 25 MG PO TABS
25.0000 mg | ORAL_TABLET | Freq: Four times a day (QID) | ORAL | Status: DC | PRN
  Administered 2014-09-02: 25 mg via ORAL
  Filled 2014-08-31: qty 1

## 2014-08-31 MED ORDER — SERTRALINE HCL 50 MG PO TABS
50.0000 mg | ORAL_TABLET | Freq: Every day | ORAL | Status: DC
  Administered 2014-08-31 – 2014-09-02 (×3): 50 mg via ORAL
  Filled 2014-08-31 (×3): qty 1

## 2014-08-31 MED ORDER — ALPRAZOLAM 0.25 MG PO TABS
0.2500 mg | ORAL_TABLET | Freq: Once | ORAL | Status: AC
  Administered 2014-08-31: 0.25 mg via ORAL
  Filled 2014-08-31: qty 1

## 2014-08-31 MED ORDER — QUETIAPINE FUMARATE 50 MG PO TABS
100.0000 mg | ORAL_TABLET | Freq: Every day | ORAL | Status: DC
  Administered 2014-08-31: 100 mg via ORAL
  Administered 2014-09-01 (×2): 50 mg via ORAL
  Filled 2014-08-31 (×3): qty 2

## 2014-08-31 NOTE — Progress Notes (Signed)
Increased agitation and c/o feeling upset and nervous; called Dr. Raelene Bott for prn anxiety; one time order of xanax .25 mg ordered.  Medicated patient with xanax one time dose and emotional support provided; patient has been agitated by phone calls.
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
## 2014-08-31 NOTE — Progress Notes (Signed)
Subjective:  Patient stating that her strength in her left leg has improved. She states that the strength in her left arm is still weak. She reports many stressors in her life again today. She is inquiring about whether her current symptoms may be caused by her underlying stressors. Otherwise, not reporting any complaints.  Reports later in the afternoon that patient has been somewhat violent and throwing cell phones at people in the room.  Objective: Vital signs in last 24 hours: Filed Vitals:   08/31/14 0300 08/31/14 0500 08/31/14 1016 08/31/14 1408  BP: 130/83 131/76 140/81 122/76  Pulse: 72 80 92 83  Temp: 98.1 F (36.7 C) 98 F (36.7 C) 98.8 F (37.1 C) 99 F (37.2 C)  TempSrc: Oral Oral Oral Oral  Resp: 14 16 18 20   SpO2: 96% 98% 96% 97%   Weight change:  No intake or output data in the 24 hours ending 08/31/14 1518  General: resting in bed Cardiac: RRR, no rubs, murmurs or gallops Pulm: clear to auscultation bilaterally, moving normal volumes of air Abd: soft, nontender, nondistended, BS present Ext: warm and well perfused, no pedal edema Neuro: alert and oriented X3  - Non-organic speech pattern, otherwise cranial nerves II through XII intact  - motor: Patient does not move left arm on command but with intermittent motion witnessed during other parts of exam, intermittently can move left leg Skin: no rashes or lesions noted Psych: Tearful  Lab Results: Basic Metabolic Panel:  Recent Labs Lab 08/30/14 1132 08/30/14 1137 08/31/14 0554  NA 137 140 136  K 3.3* 3.3* 3.9  CL 105 105 106  CO2 21*  --  22  GLUCOSE 102* 104* 104*  BUN 10 12 9   CREATININE 0.73 0.70 0.67  CALCIUM 9.1  --  9.1   Liver Function Tests:  Recent Labs Lab 08/30/14 1132  AST 20  ALT 15  ALKPHOS 75  BILITOT 0.4  PROT 7.7  ALBUMIN 3.9   No results for input(s): LIPASE, AMYLASE in the last 168 hours. No results for input(s): AMMONIA in the last 168 hours. CBC:  Recent  Labs Lab 08/30/14 1132 08/30/14 1137  WBC 7.3  --   NEUTROABS 4.3  --   HGB 11.0* 13.3  HCT 33.5* 39.0  MCV 80.7  --   PLT 401*  --    Cardiac Enzymes: No results for input(s): CKTOTAL, CKMB, CKMBINDEX, TROPONINI in the last 168 hours. BNP: No results for input(s): PROBNP in the last 168 hours. D-Dimer: No results for input(s): DDIMER in the last 168 hours. CBG:  Recent Labs Lab 08/30/14 1138  GLUCAP 114*   Hemoglobin A1C: No results for input(s): HGBA1C in the last 168 hours. Fasting Lipid Panel: No results for input(s): CHOL, HDL, LDLCALC, TRIG, CHOLHDL, LDLDIRECT in the last 168 hours. Thyroid Function Tests: No results for input(s): TSH, T4TOTAL, FREET4, T3FREE, THYROIDAB in the last 168 hours. Coagulation:  Recent Labs Lab 08/30/14 1132  LABPROT 14.0  INR 1.06   Anemia Panel: No results for input(s): VITAMINB12, FOLATE, FERRITIN, TIBC, IRON, RETICCTPCT in the last 168 hours. Urine Drug Screen: Drugs of Abuse     Component Value Date/Time   LABOPIA NONE DETECTED 08/30/2014 1236   COCAINSCRNUR POSITIVE* 08/30/2014 1236   LABBENZ NONE DETECTED 08/30/2014 1236   AMPHETMU NONE DETECTED 08/30/2014 1236   THCU NONE DETECTED 08/30/2014 1236   LABBARB NONE DETECTED 08/30/2014 1236    Alcohol Level:  Recent Labs Lab 08/30/14 [place] <5  Urinalysis:  Recent Labs Lab 08/30/14 1236  COLORURINE YELLOW  LABSPEC 1.012  PHURINE 8.5*  GLUCOSEU NEGATIVE  HGBUR NEGATIVE  BILIRUBINUR NEGATIVE  KETONESUR NEGATIVE  PROTEINUR NEGATIVE  UROBILINOGEN 0.2  NITRITE NEGATIVE  LEUKOCYTESUR NEGATIVE   Micro Results: No results found for this or any previous visit (from the past 240 hour(s)). Studies/Results: Ct Head Wo Contrast  08/30/2014   CLINICAL DATA:  Initial encounter for code stroke  EXAM: CT HEAD WITHOUT CONTRAST  TECHNIQUE: Contiguous axial images were obtained from the base of the skull through the vertex without intravenous contrast.  COMPARISON:   05/03/2014.  FINDINGS: There is no evidence for acute hemorrhage, hydrocephalus, mass lesion, or abnormal extra-axial fluid collection. No definite CT evidence for acute infarction. The visualized paranasal sinuses and mastoid air cells are clear.  IMPRESSION: Normal CT exam head.  I discussed with Dr. Nicole Kindred at approximately 11:50 a.m. on 08/30/2014.   Electronically Signed   By: Misty Stanley M.D.   On: 08/30/2014 11:55   Medications: I have reviewed the patient's current medications. Scheduled Meds: . enoxaparin (LOVENOX) injection  40 mg Subcutaneous Q24H  . 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 It is not entirely clear whether her apparent neurological deficits are unintentional, and if intentional whether they are for primary or secondary gain. Given some of the intermittent quality and inconsistencies of her symptoms in addition to her clear social stressors, it is less likely that her symptoms are caused by CVA. -Psychiatry consultation and assistance with further management. -May consider cognitive behavioral therapy, physical therapy, and SSRI in the case of conversion disorder.  Schizophrenia: Previously on a regimen of Klonopin, Seroquel, and trazodone. She has not been on these medications because she has not established care yet in this area as she is from [place]. No current suicidal or homicidal ideation. -Psychiatry consultation  Diet: heart Prophylaxis: lovenox  Code: FULL  Dispo: Disposition is deferred at this time, awaiting improvement of current medical problems.   The patient does not have a current PCP (No Pcp Per Patient) and does  need an [facility] hospital follow-up appointment after discharge.  The patient does not have transportation limitations that hinder transportation to clinic appointments.    Services Needed at time of discharge: Y = Yes, Blank = No PT:  recommending SNF  OT:   RN:   Equipment:   Other:    Luan Moore, MD 08/31/2014, 3:18 PM

## 2014-08-31 NOTE — Progress Notes (Signed)
OT Cancellation Note  Patient Details Name: Joanne Gonzalez MRN: [id number] DOB: 1966-03-17   Cancelled Treatment:    Reason Eval/Treat Not Completed: Other (comment). Per orders, pt on strict bed rest. Please update activity orders when pt appropriate for OT evaluation.  Benito Mccreedy OTR/L [phone] 08/31/2014, 8:46 AM

## 2014-08-31 NOTE — Evaluation (Signed)
Physical Therapy Evaluation Patient Details Name: Joanne Gonzalez MRN: [id number] DOB: [birth year] Today's Date: 08/31/2014   History of Present Illness  Patient is a 49 year old female with history of CVA, hypertension, seizures, and schizophrenia who presents with speech difficulty and left-sided weakness.   Clinical Impression  Pt admitted with above diagnosis. Pt currently with functional limitations due to the deficits listed below (see PT Problem List). Pt very inconsistent with LUE/LE movement and deficits. Pt will benefit from skilled PT to increase their independence and safety with mobility to allow discharge to the venue listed below.  Based on her current assist level needed for functional mobility. SNF is recommended at d/c.     Follow Up Recommendations SNF;Supervision/Assistance - 24 hour    Equipment Recommendations  Wheelchair (measurements PT)    Recommendations for Other Services       Precautions / Restrictions Precautions Precautions: Fall      Mobility  Bed Mobility Overal bed mobility: Needs Assistance Bed Mobility: Supine to Sit;Sit to Supine     Supine to sit: Min assist Sit to supine: Min assist   General bed mobility comments: assist with LLE and to elevate trunk  Transfers                 General transfer comment: Pt's agitation and anxiety escalating with sitting EOB. Female visitor present in room during eval possibly affecting pt's behavior. Unsafe to proceed with further mobility, requiring return to supine. OT eval completed after PT and reports max assist sit to stand.  Ambulation/Gait                Stairs            Wheelchair Mobility    Modified Rankin (Stroke Patients Only)       Balance   Sitting-balance support: No upper extremity supported;Feet unsupported Sitting balance-Leahy Scale: Good Sitting balance - Comments: Pt sat EOB with supervision                                      Pertinent Vitals/Pain Pain Assessment: 0-10 Pain Score: 8  Pain Location: headache Pain Descriptors / Indicators: Headache Pain Intervention(s): Limited activity within patient's tolerance    Home Living Family/patient expects to be discharged to:: Private residence Living Arrangements: Alone Available Help at Discharge: Friend(s);Available PRN/intermittently Type of Home: Other(Comment) (Duplex) Home Access: Level entry     Home Layout: One level Home Equipment: Clinical cytogeneticist - 2 wheels      Prior Function Level of Independence: Independent               Hand Dominance        Extremity/Trunk Assessment   Upper Extremity Assessment: Defer to OT evaluation           Lower Extremity Assessment: LLE deficits/detail   LLE Deficits / Details: difficult MMT due to poor effort from pt and inconsistency with ROM/strength     Communication   Communication: No difficulties  Cognition Arousal/Alertness: Awake/alert Behavior During Therapy: Anxious;Agitated Overall Cognitive Status: Within Functional Limits for tasks assessed                      General Comments      Exercises        Assessment/Plan    PT Assessment Patient needs continued PT services  PT Diagnosis Difficulty walking   PT Problem List  Decreased activity tolerance;Decreased mobility;Pain  PT Treatment Interventions DME instruction;Gait training;Functional mobility training;Therapeutic activities;Therapeutic exercise;Patient/family education;Balance training   PT Goals (Current goals can be found in the Care Plan section) Acute Rehab PT Goals Patient Stated Goal: home PT Goal Formulation: With patient Time For Goal Achievement: 08/31/14 Potential to Achieve Goals: Good    Frequency Min 3X/week   Barriers to discharge        Co-evaluation               End of Session   Activity Tolerance: Treatment limited secondary to agitation Patient left: in bed;with  family/visitor present;with call bell/phone within reach      Functional Assessment Tool Used: clinical judgement Functional Limitation: Mobility: Walking and moving around Mobility: Walking and Moving Around Current Status (H8527): At least 80 percent but less than 100 percent impaired, limited or restricted Mobility: Walking and Moving Around Goal Status [phone]): At least 1 percent but less than 20 percent impaired, limited or restricted    Time: 3536-1443 PT Time Calculation (min) (ACUTE ONLY): 13 min   Charges:   PT Evaluation $Initial PT Evaluation Tier I: 1 Procedure     PT G Codes:   PT G-Codes **NOT FOR INPATIENT CLASS** Functional Assessment Tool Used: clinical judgement Functional Limitation: Mobility: Walking and moving around Mobility: Walking and Moving Around Current Status (X5400): At least 80 percent but less than 100 percent impaired, limited or restricted Mobility: Walking and Moving Around Goal Status [phone]): At least 1 percent but less than 20 percent impaired, limited or restricted    Lorriane Shire 08/31/2014, 1:25 PM

## 2014-08-31 NOTE — Evaluation (Signed)
Occupational Therapy Evaluation Patient Details Name: Joanne Gonzalez MRN: [id number] DOB: [birth year] Today's Date: 08/31/2014    History of Present Illness Patient is a 49 year old female with history of CVA, hypertension, seizures, and schizophrenia who was admitted with speech difficulty and left-sided weakness.    Clinical Impression   Pt admitted with above. Pt independent with ADLs, PTA. Feel pt will benefit from acute OT to increase independence and address LUE prior to d/c. Recommending SNF for rehab, however pt not agreeable to this at this time.     Follow Up Recommendations  SNF;Supervision/Assistance - 24 hour    Equipment Recommendations  3 in 1 bedside comode    Recommendations for Other Services       Precautions / Restrictions Precautions Precautions: Fall Restrictions Weight Bearing Restrictions: No      Mobility Bed Mobility Overal bed mobility: Needs Assistance Bed Mobility: Supine to Sit;Sit to Supine     Supine to sit: Min guard Sit to supine: Supervision   General bed mobility comments: Min guard for safety.  Transfers Overall transfer level: Needs assistance Equipment used: 1 person hand held assist Transfers: Sit to/from Stand Sit to Stand: Max assist         General transfer comment: Tried to practice sit to stand a few times.     Balance   Unsteady once standing. Able to sit EOB without LOB.                                     ADL Overall ADL's : Needs assistance/impaired     Grooming: Wash/dry face;Sitting;Minimal assistance (washed face sitting EOB-no physical assist needed for this)               Lower Body Dressing: Moderate assistance;Sitting/lateral leans   Toilet Transfer: Maximal assistance (sit to stand from bed)             General ADL Comments: Pt sat EOB and was able to don/doff socks-OT suggested crossing her legs over knees. Encouraged pt to be using left hand. Pt reports she will be  able to get around with a walker. Discussed recommending 3 in 1 as well as d/c recommendation.     Vision     Perception     Praxis      Pertinent Vitals/Pain Pain Assessment: 0-10 Pain Score: 8  Pain Location: head and left arm Pain Descriptors / Indicators: Headache;Numbness Pain Intervention(s): Monitored during session     Hand Dominance Right   Extremity/Trunk Assessment Upper Extremity Assessment Upper Extremity Assessment: LUE deficits/detail LUE Deficits / Details: able to move digits;very minimal active elbow flexion; shoulder appeared flaccid. LUE Sensation: decreased light touch LUE Coordination: decreased fine motor;decreased gross motor   Lower Extremity Assessment Lower Extremity Assessment: Defer to PT evaluation LLE Deficits / Details: difficult MMT due to poor effort from pt and inconsistency with ROM/strength LLE Sensation: decreased light touch (as reported by pt)       Communication Communication Communication: Expressive difficulties   Cognition Arousal/Alertness: Awake/alert Behavior During Therapy: WFL for tasks assessed/performed Overall Cognitive Status: Within Functional Limits for tasks assessed                     General Comments       Exercises       Shoulder Instructions      Home Living Family/patient expects to be discharged to:: Private residence  Living Arrangements: Alone Available Help at Discharge: Friend(s);Available PRN/intermittently Type of Home: Other(Comment) (duplex) Home Access: Level entry     Home Layout: One level     Bathroom Shower/Tub: Teacher, early years/pre: Standard     Home Equipment: Clinical cytogeneticist - 2 wheels          Prior Functioning/Environment Level of Independence: Independent             OT Diagnosis: Hemiplegia non-dominant side   OT Problem List: Decreased safety awareness;Decreased knowledge of use of DME or AE;Decreased knowledge of  precautions;Pain;Impaired UE functional use;Impaired sensation;Decreased coordination;Decreased strength;Impaired balance (sitting and/or standing)   OT Treatment/Interventions: Self-care/ADL training;Therapeutic exercise;Neuromuscular education;DME and/or AE instruction;Therapeutic activities;Cognitive remediation/compensation;Patient/family education;Balance training    OT Goals(Current goals can be found in the care plan section) Acute Rehab OT Goals Patient Stated Goal: go home OT Goal Formulation: With patient Time For Goal Achievement: 09/07/14 Potential to Achieve Goals: Good ADL Goals Pt Will Perform Upper Body Dressing: with set-up;with supervision;sitting Pt Will Perform Lower Body Dressing: sitting/lateral leans;with min assist;sit to/from stand Pt Will Transfer to Toilet: with min assist;stand pivot transfer;bedside commode Pt Will Perform Toileting - Clothing Manipulation and hygiene: sitting/lateral leans;with min assist;sit to/from stand Pt Will Perform Tub/Shower Transfer: with min assist;Stand pivot transfer;shower seat  OT Frequency: Min 2X/week   Barriers to D/C:            Co-evaluation              End of Session Equipment Utilized During Treatment: Gait belt  Activity Tolerance: Patient tolerated treatment well Patient left: in bed;with call bell/phone within reach;with bed alarm set;with family/visitor present   Time: 9563-8756 OT Time Calculation (min): 13 min Charges:  OT General Charges $OT Visit: 1 Procedure OT Evaluation $Initial OT Evaluation Tier I: 1 Procedure G-Codes: OT G-codes **NOT FOR INPATIENT CLASS** Functional Assessment Tool Used: clinical judgment Functional Limitation: Self care Self Care Current Status (E3329): At least 40 percent but less than 60 percent impaired, limited or restricted Self Care Goal Status (J1884): At least 20 percent but less than 40 percent impaired, limited or restricted  Benito Mccreedy  OTR/L [phone] 08/31/2014, 1:52 PM

## 2014-08-31 NOTE — Progress Notes (Signed)
Patient is actively moving her left arm and left leg this evening.

## 2014-09-01 DIAGNOSIS — R739 Hyperglycemia, unspecified: Secondary | ICD-10-CM | POA: Diagnosis present

## 2014-09-01 DIAGNOSIS — D649 Anemia, unspecified: Secondary | ICD-10-CM

## 2014-09-01 DIAGNOSIS — D509 Iron deficiency anemia, unspecified: Secondary | ICD-10-CM | POA: Diagnosis present

## 2014-09-01 DIAGNOSIS — F200 Paranoid schizophrenia: Secondary | ICD-10-CM | POA: Diagnosis not present

## 2014-09-01 DIAGNOSIS — F1490 Cocaine use, unspecified, uncomplicated: Secondary | ICD-10-CM | POA: Diagnosis present

## 2014-09-01 DIAGNOSIS — R531 Weakness: Secondary | ICD-10-CM | POA: Diagnosis not present

## 2014-09-01 DIAGNOSIS — F1410 Cocaine abuse, uncomplicated: Secondary | ICD-10-CM | POA: Diagnosis not present

## 2014-09-01 NOTE — Progress Notes (Signed)
Pt. Taken to bathroom and stated she would push light when ready. Explained that door must remained cracked as well as door. I come back to check on patient and door is closed and fiance is in bathroom. Pt. Is being non-complaint about bathroom privileges and charge nurse will be notified. Ruben Gottron, RN 09/01/2014 9:46 PM

## 2014-09-01 NOTE — Progress Notes (Signed)
Pt. Was helped back to the bed and was found to be showering with fiance in restroom with her with the door closed. Educated on leaving the door cracked and not showering without a nurse or tech present. In bed with bed alarm on will continue to monitor. Ruben Gottron, RN 09/01/2014 10:31 PM

## 2014-09-01 NOTE — Progress Notes (Signed)
Subjective:  Pt seen and examined in AM. Yesterday she was combative and was throwing objects. She was seen by psychiatry and started on medications. She reports feeling much better today. Her speech is much improved and is able to move her left side with improved sensation. She has not yet ambulated but has been using the bedside commode.     Objective: Vital signs in last 24 hours: Filed Vitals:   08/31/14 2132 09/01/14 0228 09/01/14 0623 09/01/14 0700  BP: 149/82 135/82 125/77   Pulse: 92 72 63   Temp: 98.8 F (37.1 C) 98.3 F (36.8 C) 97.7 F (36.5 C)   TempSrc: Oral Oral Oral   Resp: 20 18 16    Height:    5\' 4"  (1.626 m)  Weight:    148 lb (67.132 kg)  SpO2: 98% 98% 99%    Weight change:  No intake or output data in the 24 hours ending 09/01/14 0820  General: Sitting up in bed Cardiac: RRR, no rubs, murmurs or gallops Pulm: Clear to auscultation bilaterally, moving normal volumes of air Abd: Soft, nontender, nondistended, BS present Ext: Warm and well perfused, no pedal edema Neuro: Alert and oriented X3, slow speech, cranial nerves II through XII intact. Decreased sensation to light touch of left UE and LE. Normal 5/5 muscle strength throughout with poor effort for left UE and LE. Normal reflexed. Unable to assess gait.  Skin: No rashes or lesions noted Psych: Flat affect  Lab Results: Basic Metabolic Panel:  Recent Labs Lab 08/30/14 1132 08/30/14 1137 08/31/14 0554  NA 137 140 136  K 3.3* 3.3* 3.9  CL 105 105 106  CO2 21*  --  22  GLUCOSE 102* 104* 104*  BUN 10 12 9   CREATININE 0.73 0.70 0.67  CALCIUM 9.1  --  9.1   Liver Function Tests:  Recent Labs Lab 08/30/14 1132  AST 20  ALT 15  ALKPHOS 75  BILITOT 0.4  PROT 7.7  ALBUMIN 3.9   CBC:  Recent Labs Lab 08/30/14 1132 08/30/14 1137  WBC 7.3  --   NEUTROABS 4.3  --   HGB 11.0* 13.3  HCT 33.5* 39.0  MCV 80.7  --   PLT 401*  --    CBG:  Recent Labs Lab 08/30/14 1138  GLUCAP 114*     Coagulation:  Recent Labs Lab 08/30/14 1132  LABPROT 14.0  INR 1.06   Urine Drug Screen: Drugs of Abuse     Component Value Date/Time   LABOPIA NONE DETECTED 08/30/2014 1236   COCAINSCRNUR POSITIVE* 08/30/2014 1236   LABBENZ NONE DETECTED 08/30/2014 1236   AMPHETMU NONE DETECTED 08/30/2014 1236   THCU NONE DETECTED 08/30/2014 1236   LABBARB NONE DETECTED 08/30/2014 1236    Alcohol Level:  Recent Labs Lab 08/30/14 1132  ETH <5   Urinalysis:  Recent Labs Lab 08/30/14 1236  [place]  LABSPEC 1.012  PHURINE 8.5*  GLUCOSEU NEGATIVE  HGBUR NEGATIVE  BILIRUBINUR NEGATIVE  KETONESUR NEGATIVE  PROTEINUR NEGATIVE  UROBILINOGEN 0.2  NITRITE NEGATIVE  LEUKOCYTESUR NEGATIVE   Studies/Results: Ct Head Wo Contrast  08/30/2014   CLINICAL DATA:  Initial encounter for code stroke  EXAM: CT HEAD WITHOUT CONTRAST  TECHNIQUE: Contiguous axial images were obtained from the base of the skull through the vertex without intravenous contrast.  COMPARISON:  05/03/2014.  FINDINGS: There is no evidence for acute hemorrhage, hydrocephalus, mass lesion, or abnormal extra-axial fluid collection. No definite CT evidence for acute infarction. The visualized paranasal  sinuses and mastoid air cells are clear.  IMPRESSION: Normal CT exam head.  I discussed with Dr. Nicole Kindred at approximately 11:50 a.m. on 08/30/2014.   Electronically Signed   By: Misty Stanley M.D.   On: 08/30/2014 11:55   Medications: I have reviewed the patient's current medications. Scheduled Meds: . enoxaparin (LOVENOX) injection  40 mg Subcutaneous Q24H  . QUEtiapine  100 mg Oral QHS  . sertraline  50 mg Oral Daily  . sodium chloride  3 mL Intravenous Q12H   Continuous Infusions:  PRN Meds:.acetaminophen **OR** acetaminophen, hydrOXYzine Assessment/Plan:   Untreated Paranoid Schizophrenia: Much improved affect, speech, and left sided extremity movement after starting anti-psychotic and SSRI medications. Has not  been on medical therapy for the past 3 years and reports recent stressors with cocaine use that likely triggered worsening of her psychiatric condition.  -Appreciate psychiatry consultation -Continue newly seroquel 100 mg daily, zoloft 50 mg daily, and hydroxyzine 25 mg Q 6 hr PRN  -Monitor QTc  with newly started medications (467 on last 12-lead EKG) -Pt does not meet inpatient psychiatric criteria   -SW and CM consults for psychiatry follow-up and medication assistance  -Revaluation by PT and OT now that has initiated medical therapy  -Ambulate every shift  Normocytic Anemia - Hg 11 on admission below 13.8 last year. Pt denies active bleeding.  -Monitor CBC -Obtain anemia panel and smear review   Hyperglycemia - Glucose 104.  -Obtain A1c  Cocaine Use - UDS positive for cocaine on admission -Counsel on cessation  -Obtain HIV Ab and HCV Ab  Diet: Heart Prophylaxis: lovenox  Code: FULL  Dispo: Disposition is deferred at this time, awaiting improvement of current medical problems.   The patient does not have a current PCP (No Pcp Per Patient) and does need an [facility] hospital follow-up appointment after discharge.  The patient does not have transportation limitations that hinder transportation to clinic appointments.    Services Needed at time of discharge: Y = Yes, Blank = No PT:    OT:   RN:   Equipment:   Other:    Juluis Mire, MD 09/01/2014, 8:20 AM

## 2014-09-01 NOTE — Progress Notes (Signed)
Ambulated patient to the bathroom without using the stedy; with gait belt; patient has unsteady gait but was able to bare weight on both legs and mobilize to the toilet; unsafe alone; one assist required.

## 2014-09-02 DIAGNOSIS — F200 Paranoid schizophrenia: Secondary | ICD-10-CM | POA: Diagnosis not present

## 2014-09-02 LAB — CBC
HCT: 34.4 % — ABNORMAL LOW (ref 36.0–46.0)
Hemoglobin: 11.1 g/dL — ABNORMAL LOW (ref 12.0–15.0)
MCH: 26.4 pg (ref 26.0–34.0)
MCHC: 32.3 g/dL (ref 30.0–36.0)
MCV: 81.7 fL (ref 78.0–100.0)
PLATELETS: 390 10*3/uL (ref 150–400)
RBC: 4.21 MIL/uL (ref 3.87–5.11)
RDW: 15.2 % (ref 11.5–15.5)
WBC: 7.5 10*3/uL (ref 4.0–10.5)

## 2014-09-02 LAB — FOLATE: FOLATE: 7.5 ng/mL (ref >5.9)

## 2014-09-02 LAB — RETICULOCYTES
RBC.: 4.21 MIL/uL (ref 3.87–5.11)
RETIC COUNT ABSOLUTE: 71.6 10*3/uL (ref 19.0–186.0)
Retic Ct Pct: 1.7 % (ref 0.4–3.1)

## 2014-09-02 LAB — VITAMIN B12: Vitamin B-12: 237 pg/mL (ref 180–914)

## 2014-09-02 LAB — TECHNOLOGIST SMEAR REVIEW

## 2014-09-02 LAB — HIV ANTIBODY (ROUTINE TESTING W REFLEX): HIV Screen 4th Generation wRfx: NONREACTIVE

## 2014-09-02 LAB — IRON AND TIBC
IRON: 14 ug/dL — AB (ref 28–170)
SATURATION RATIOS: 3 % — AB (ref 10.4–31.8)
TIBC: 444 ug/dL (ref 250–450)
UIBC: 430 ug/dL

## 2014-09-02 LAB — FERRITIN: FERRITIN: 5 ng/mL — AB (ref 11–307)

## 2014-09-02 MED ORDER — HYDROXYZINE HCL 25 MG PO TABS: 25.0000 mg | ORAL_TABLET | Freq: Four times a day (QID) | ORAL | Status: DC | PRN

## 2014-09-02 MED ORDER — SERTRALINE HCL 50 MG PO TABS: 50.0000 mg | ORAL_TABLET | Freq: Every day | ORAL | Status: DC

## 2014-09-02 MED ORDER — QUETIAPINE FUMARATE 100 MG PO TABS: 100.0000 mg | ORAL_TABLET | Freq: Every day | ORAL | Status: DC

## 2014-09-02 MED ORDER — FERROUS SULFATE 325 (65 FE) MG PO TABS: 325.0000 mg | ORAL_TABLET | Freq: Every day | ORAL | Status: DC

## 2014-09-02 MED ORDER — FERROUS SULFATE 325 (65 FE) MG PO TABS
325.0000 mg | ORAL_TABLET | Freq: Every day | ORAL | Status: DC
  Administered 2014-09-02: 325 mg via ORAL
  Filled 2014-09-02: qty 1

## 2014-09-02 NOTE — Discharge Summary (Signed)
Name: Joanne Gonzalez MRN: [id number] DOB: June 21, 1965 49 y.o. PCP: No Pcp Per Patient  Date of Admission: 08/30/2014 11:30 AM Date of Discharge: 09/02/2014 Attending Physician: Dr. Aldine Contes  Discharge Diagnosis: Active Problems:   Left-sided weakness   Schizophrenia, paranoid   Anemia   Cocaine use   Hyperglycemia  Discharge Medications:   Medication List    TAKE these medications        ferrous sulfate 325 (65 FE) MG tablet  Take 1 tablet (325 mg total) by mouth daily with breakfast.     hydrOXYzine 25 MG tablet  Commonly known as:  ATARAX/VISTARIL  Take 1 tablet (25 mg total) by mouth every 6 (six) hours as needed for anxiety (agitation).     QUEtiapine 100 MG tablet  Commonly known as:  SEROQUEL  Take 1 tablet (100 mg total) by mouth at bedtime.     sertraline 50 MG tablet  Commonly known as:  ZOLOFT  Take 1 tablet (50 mg total) by mouth daily.        Disposition and follow-up:   Ms.Joanne Gonzalez was discharged from [facility] in Stable condition.  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   Iron deficiency anemia: Would follow-up hemoglobin and consider further workup.  Hyperglycemia: Very slight hyperglycemia upon initial evaluation in a hemoglobin A1c is pending at the time of discharge.  Cocaine Use: At the time of discharge, hepatitis C and HIV antibody are still pending.  2.  Labs / imaging needed at time of follow-up: hemoglobin  3.  Pending labs/ test needing follow-up: hepatitis C and HIV antibodies  Follow-up Appointments:     Follow-up Information    Follow up with [facility]    .   Why:  Follow up with your primary care provider for ongoing healthcare needs   Contact information:   [address] [place] [address] [phone]      Follow  up with [facility].   Specialty:  Emergency Medicine   Why:  Return to ER If symptoms worsen   Contact information:   [address] [id number] [place] [phone]      Follow up with [facility].   Specialty:  Neurology   Why:  Recheck of symptoms if not improving in 1 week   Contact information:   [address] [place] [phone]      Follow up with [facility]    .   Contact information:   [address] [place] [address] [phone]      Follow up with [facility]. Go in 1 day.   Specialty:  Behavioral Health   Why:  M-Th 8am-3:00pm walk-in only, medication managment, counseling services, Outpatient psychiatric follow-up   Contact information:   [place] [address] [phone]       Discharge Instructions: Discharge Instructions    Call MD for:  difficulty breathing, headache or visual disturbances    Complete by:  As directed      Call MD for:  difficulty breathing, headache or visual disturbances    Complete by:  As directed      Call MD for:  extreme fatigue    Complete by:  As directed      Call  MD for:  extreme fatigue    Complete by:  As directed      Call MD for:  hives    Complete by:  As directed      Call MD for:  hives    Complete by:  As directed      Call MD for:  persistant dizziness or light-headedness    Complete by:  As directed      Call MD for:  persistant dizziness or light-headedness    Complete by:  As directed      Call MD for:  persistant nausea and vomiting    Complete by:  As directed      Call MD for:  persistant nausea and vomiting    Complete by:  As directed      Call MD for:  redness, tenderness, or signs of infection (pain, swelling, redness, odor or green/yellow discharge around incision site)    Complete by:  As directed       Call MD for:  redness, tenderness, or signs of infection (pain, swelling, redness, odor or green/yellow discharge around incision site)    Complete by:  As directed      Call MD for:  severe uncontrolled pain    Complete by:  As directed      Call MD for:  severe uncontrolled pain    Complete by:  As directed      Call MD for:  temperature >100.4    Complete by:  As directed      Call MD for:  temperature >100.4    Complete by:  As directed      Diet - low sodium heart healthy    Complete by:  As directed      Diet - low sodium heart healthy    Complete by:  As directed      Increase activity slowly    Complete by:  As directed      Increase activity slowly    Complete by:  As directed            Consultations: Treatment Team:  Ambrose Finland, MD  Procedures Performed:  Ct Head Wo Contrast  08/30/2014   CLINICAL DATA:  Initial encounter for code stroke  EXAM: CT HEAD WITHOUT CONTRAST  TECHNIQUE: Contiguous axial images were obtained from the base of the skull through the vertex without intravenous contrast.  COMPARISON:  05/03/2014.  FINDINGS: There is no evidence for acute hemorrhage, hydrocephalus, mass lesion, or abnormal extra-axial fluid collection. No definite CT evidence for acute infarction. The visualized paranasal sinuses and mastoid air cells are clear.  IMPRESSION: Normal CT exam head.  I discussed with Dr. Nicole Kindred at approximately 11:50 a.m. on 08/30/2014.   Electronically Signed   By: Misty Stanley M.D.   On: 08/30/2014 11:55    2D Echo: none  Cardiac Cath: none  Admission HPI:   Patient is a 49 year old female with history of CVA, hypertension, seizures, and schizophrenia who presents with speech difficulty and left-sided weakness. Pt noticed these symptoms last night around 4:00am during an argument with a female friend. Her female friend noted that her speech was different but did not notice any facial droop. She then went to sleep on the couch and woke up  with the same slurred speech and left sided weakness. Her family member called EMS to bring her to the ED. Pt states her symptoms have remained the same since it started. Left sided weakeness is associated with pain.  Pain starts at her lt shoulder and radiates down to her fingertips and also starts at her left hip and radiates to her left toes. She rates pain as 9/10, previously 10/10 when pain first started. Pain is a dull sensation. States left sided weakness and pain was gradual, denies any falls. She has left sided face tightness over maxillary sinus. She is also having a headache that she normally gets which resolve with aleve. HA is located on upper scalp and the back of her head.  In the ED CT of head was negative. Neurology was consulted and noted her speech pattern was non organic and PE findings were inconsistent w/ very poor effort. Neurology felt that symptoms were psychogenic in origin. However, pt was unable to ambulate w/o assistance and thus she was admitted for this.  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 A code stroke was initially called in the emergency department and a CT head was unremarkable for any acute intracranial abnormalities. An MRI was ordered but patient did not tolerate the imaging due to claustrophobia. Neurology was consulted and thought that patient's presenting symptoms were psychogenic in nature. This was due to an inconsistent exam as well as evidence of diminished effort on some points on physical exam. No further neuroimaging workup was thought to be appropriate. Psychiatry was consulted as patient was previously diagnosed in [place] with schizophrenia and had  reportedly been on Klonopin, Seroquel, and trazodone. The patient was not felt to be appropriate for inpatient admission by psychiatry and was started on a new regimen of Seroquel 100 mg daily, Zoloft 50 mg daily, and hydroxyzine 25 mg every 6 hours when necessary. Physical therapy recommended skilled nursing facility placement due to lack of 24-hour supervision available at home. However, patient refused placement in a nursing facility and patient was sent home with home health. Unfortunately, patient does not qualify for physical or occupational therapy at home. Patient will receive nursing therapy with home health. Would ensure continued follow-up at [facility] clinic.   Iron deficiency anemia: Patient's hemoglobin down to 11.1 from 13.8 last year. Anemia panel was remarkable for iron deficiency anemia. Ferrous sulfate 325 mg daily was started.  Hyperglycemia: Very slight hyperglycemia upon initial evaluation in a hemoglobin A1c is pending at the time of discharge.  Cocaine Use: UDS positive for cocaine on admission. Patient was counseled on cessation. At the time of discharge, hepatitis C and HIV antibody are still pending.  Discharge Vitals:   BP 137/75 mmHg  Pulse 64  Temp(Src) 98.1 F (36.7 C) (Oral)  Resp 20  Ht 5\' 4"  (1.626 m)  Wt 148 lb (67.132 kg)  BMI 25.39 kg/m2  SpO2 100%  Discharge Labs:  Results for orders placed or performed during the hospital encounter of 08/30/14 (from the past 24 hour(s))  Vitamin B12     Status: None   Collection Time: 09/02/14  5:40 AM  Result Value Ref Range   Vitamin B-12 237 180 - 914 pg/mL  Folate     Status: None   Collection Time: 09/02/14  5:40 AM  Result Value Ref Range   Folate 7.5 >5.9 ng/mL  Iron and TIBC     Status: Abnormal   Collection Time: 09/02/14  5:40 AM  Result Value  Ref Range   Iron 14 (L) 28 - 170 ug/dL   TIBC 444 250 - 450 ug/dL   Saturation Ratios 3 (L) 10.4 - 31.8 %   UIBC 430 ug/dL  Ferritin     Status: Abnormal    Collection Time: 09/02/14  5:40 AM  Result Value Ref Range   Ferritin 5 (L) 11 - 307 ng/mL  Reticulocytes     Status: None   Collection Time: 09/02/14  5:40 AM  Result Value Ref Range   Retic Ct Pct 1.7 0.4 - 3.1 %   RBC. 4.21 3.87 - 5.11 MIL/uL   Retic Count, Manual 71.6 19.0 - 186.0 K/uL  Technologist smear review     Status: None   Collection Time: 09/02/14  5:40 AM  Result Value Ref Range   Tech Review POLYCHROMASIA PRESENT   CBC     Status: Abnormal   Collection Time: 09/02/14  5:40 AM  Result Value Ref Range   WBC 7.5 4.0 - 10.5 K/uL   RBC 4.21 3.87 - 5.11 MIL/uL   Hemoglobin 11.1 (L) 12.0 - 15.0 g/dL   HCT 34.4 (L) 36.0 - 46.0 %   MCV 81.7 78.0 - 100.0 fL   MCH 26.4 26.0 - 34.0 pg   MCHC 32.3 30.0 - 36.0 g/dL   RDW 15.2 11.5 - 15.5 %   Platelets 390 150 - 400 K/uL    Signed: Luan Moore, MD 09/02/2014, 1:19 PM    Services Ordered on Discharge: none Equipment Ordered on Discharge: none

## 2014-09-02 NOTE — Clinical Social Work Psych Note (Signed)
Psych CSW met with patient at bedside to review referral to [facility] for medication management and counseling.  Patient states that her PCP is [facility].  Psych CSW explained the difference in her PCP and [facility].  Patient verbalized understanding.  Patient states she is needing somewhere to ger psych meds.  Psych CSW reviewed [facility]'s protocol regarding new patients.  New patients must walk-in Monday-Thursday 8:30-3pm for their initial assessment.  Patient is agreeable to this follow-up and states if she is discharged today that she will go to [facility] Wednesday 09/04/2014.  Patient states she has transportation to this appointment.  Patient was given this information in written form and was advised that this follow-up information would also be on her discharge summary for her convenience.    Psych CSW updated psychiatrist and RN.  Gina , LCSW [phone]  Psychiatric & Orthopedics (5N 1-8) Clinical Social Worker    

## 2014-09-02 NOTE — Care Management Note (Addendum)
Case Management Note  Patient Details  Name: Joanne Gonzalez MRN: [id number] Date of Birth: January 23, 1966  Subjective/Objective:                    Action/Plan:  Received consult regarding patient needing a PCP, medication assistance and transportation.  Patient has Medicaid and follows at [facility], which is the clinic assigned to her by Medicaid (per patient).  Patient uses Medicaid transportation to get to and from appointments.  Patient has prescription coverage under Medicaid and can also use Medicaid Transportation to the pharmacy.  Patient was provided with the [facility] Transportation number [phone].  She will make transportation arrangements independently at discharge.    CM was asked by attending MD to arrange home health RN.  CM met with patient, who is declining [facility] services at this time. Expected Discharge Date:                  Expected Discharge Plan:  Home/Self Care  In-House Referral:     Discharge planning Services  CM Consult  Post Acute Care Choice:    Choice offered to:     DME Arranged:    DME Agency:     HH Arranged:    HH Agency:     Status of Service:  In process, will continue to follow  Medicare Important Message Given:    Date Medicare IM Given:    Medicare IM give by:    Date Additional Medicare IM Given:    Additional Medicare Important Message give by:     If discussed at [facility] of Stay Meetings, dates discussed:    Additional Comments:  Rolm Baptise, RN 09/02/2014, 10:14 AM
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
## 2014-09-02 NOTE — Discharge Instructions (Signed)
Please follow up with [facility] for continued care for your conditions.

## 2014-09-02 NOTE — Progress Notes (Signed)
Patient ready for discharge to home today; discharge instructions given and reviewed with patient; rx's given; patient has called her daughter for a ride home from the hospital.

## 2014-09-02 NOTE — Progress Notes (Signed)
Physical Therapy Treatment Patient Details Name: Joanne Gonzalez MRN: [id number] DOB: 1965-05-23 Today's Date: 09/02/2014    History of Present Illness Patient is a 49 year old female with history of CVA, hypertension, seizures, and schizophrenia who was admitted with speech difficulty and left-sided weakness.     PT Comments    Pt with significant improvement in L sided weakness however con't to present with ataxic gait pattern however inconsistent compared to stair negotiation. Due to dependence on RW and locking UEs out pt unable to safely stand without support of RW limiting ability to complete meal prep, laundry and other IADLs. Pt reports "i have people that can help me." She reports her daughter works but she can come and so can her neighbors or people from church however she can not verify she would have 24/7 assist/supervision. Pt would be safe to d/c home with HHPT if she had 24/7 supervision however pt requires to be at mod indep level of function since she lives by her self. ST-SNF would allow pt to achieve mod I level of function for safe transition home.   Follow Up Recommendations  SNF;Supervision/Assistance - 24 hour     Equipment Recommendations   (pt reports she has RW and shower seat)    Recommendations for Other Services       Precautions / Restrictions Precautions Precautions: Fall Restrictions Weight Bearing Restrictions: No    Mobility  Bed Mobility Overal bed mobility: Modified Independent Bed Mobility: Supine to Sit     Supine to sit: Modified independent (Device/Increase time)     General bed mobility comments: pt with safe technique, used bed rail minimally  Transfers Overall transfer level: Needs assistance Equipment used: Rolling walker (2 wheeled) Transfers: Sit to/from Stand Sit to Stand: Min guard         General transfer comment: v/c's for safe hand placement to ascend and descend  Ambulation/Gait Ambulation/Gait assistance: Min  assist Ambulation Distance (Feet): 50 Feet (x2) Assistive device: Rolling walker (2 wheeled) Gait Pattern/deviations: Step-through pattern;Decreased stride length;Narrow base of support (near scissoring) Gait velocity: decreased   General Gait Details: pt amb with elbow locked out and bilat knees locked in extension. when asked to relax shoulders pt reverts right back to locking out elbows. Attempted SPC per patients request, pt however unable to sequence safely and extremely unsteady. pt presenting with ataxic gait pattern with RW and SPC.    Stairs Stairs: Yes Stairs assistance: Min guard Stair Management: Two rails;Forwards;Step to pattern Number of Stairs: 2 General stair comments: alternating ascending, and step to descending  Wheelchair Mobility    Modified Rankin (Stroke Patients Only) Modified Rankin (Stroke Patients Only) Pre-Morbid Rankin Score: No symptoms Modified Rankin: Moderately severe disability     Balance Overall balance assessment: Needs assistance Sitting-balance support: No upper extremity supported;Feet supported Sitting balance-Leahy Scale: Good     Standing balance support: Bilateral upper extremity supported;During functional activity (walking) Standing balance-Leahy Scale: Poor Standing balance comment: requires RW for safe standing and amb                    Cognition Arousal/Alertness: Awake/alert Behavior During Therapy: WFL for tasks assessed/performed Overall Cognitive Status: Within Functional Limits for tasks assessed                      Exercises      General Comments        Pertinent Vitals/Pain Pain Assessment: No/denies pain (c/o nausea)    Home Living  Prior Function            PT Goals (current goals can now be found in the care plan section) Acute Rehab PT Goals Patient Stated Goal: go home Progress towards PT goals: Progressing toward goals    Frequency  Min  3X/week    PT Plan Current plan remains appropriate    Co-evaluation             End of Session Equipment Utilized During Treatment: Gait belt Activity Tolerance: Patient tolerated treatment well Patient left: in chair;with call bell/phone within reach;with chair alarm set (OT present)     Time: 4801-6553 PT Time Calculation (min) (ACUTE ONLY): 23 min  Charges:  $Gait Training: 23-37 mins                    G Codes:      Joanne Gonzalez 09/02/2014, 10:31 AM   Joanne Gonzalez, PT, DPT Pager #: [phone] Office #: [phone]
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
## 2014-09-02 NOTE — Progress Notes (Signed)
Occupational Therapy Treatment Patient Details Name: Joanne Gonzalez MRN: [id number] DOB: 09-Jan-1966 Today's Date: 09/02/2014    History of present illness Patient is a 49 year old female with history of CVA, hypertension, seizures, and schizophrenia who was admitted with speech difficulty and left-sided weakness.    OT comments  This 49 yo female making progress since eval. Weakness still persists in LLE, but on LUE. She is currently at a Mod I level for BADLs with recommendation of S for in and out of tub.   Follow Up Recommendations  SNF (pt refusing)          Precautions / Restrictions Precautions Precautions: Fall Restrictions Weight Bearing Restrictions: No       Mobility Bed Mobility               General bed mobility comments: Pt in recliner upon my arrival  Transfers Overall transfer level: Needs assistance Equipment used: Standard walker Transfers: Sit to/from Stand Sit to Stand: Modified independent (Device/Increase time)                  ADL Overall ADL's : Needs assistance/impaired                     Lower Body Dressing: Modified independent;Sit to/from stand Lower Body Dressing Details (indicate cue type and reason): increased time, RW level Toilet Transfer: Modified Independent;RW   Toileting- Clothing Manipulation and Hygiene: Modified independent;Sit to/from stand Toileting - Clothing Manipulation Details (indicate cue type and reason): RW level       General ADL Comments: Pt reports that she has a tub seat and grab bars in tub to help her in and out of tub and that she will plan on having someone with her the first few times she gets in and out of tub for safety      Vision                 Additional Comments: No change from baseline          Cognition   Behavior During Therapy: [facility] for tasks assessed/performed Overall Cognitive Status: Within Functional Limits for tasks assessed                                     Pertinent Vitals/ Pain       Pain Assessment: No/denies pain         Frequency Min 2X/week     Progress Toward Goals  OT Goals(current goals can now be found in the care plan section)  Progress towards OT goals: Progressing toward goals     Plan Discharge plan remains appropriate       End of Session Equipment Utilized During Treatment: Gait belt   Activity Tolerance  (pt reported she felt more tired today than yesterday)   Patient Left in bed;with call bell/phone within reach;with bed alarm set   Nurse Communication          Time: 6811-5726 OT Time Calculation (min): 14 min  Charges: OT General Charges $OT Visit: 1 Procedure OT Treatments $Self Care/Home Management : 8-22 mins  [name] [phone] 09/02/2014, 1:53 PM

## 2014-09-02 NOTE — Progress Notes (Signed)
Subjective:  Patient doing much better this morning. Strength has improved in both arm and leg. Improvements in behavior over the last 24 hours as well. Well with physical therapy. Declining transfer to skilled nursing facility.   Objective: Vital signs in last 24 hours: Filed Vitals:   09/01/14 1752 09/01/14 2201 09/02/14 0149 09/02/14 0615  BP: 140/85 154/76 108/62 109/57  Pulse: 72 65 68 70  Temp: 98.2 F (36.8 C) 99 F (37.2 C) 98.2 F (36.8 C) 98.6 F (37 C)  TempSrc: Oral Oral Oral Oral  Resp: 18 18 18 18   Height:      Weight:      SpO2: 100% 98% 97% 98%   Weight change:   Intake/Output Summary (Last 24 hours) at 09/02/14 0748 Last data filed at 09/01/14 1224  Gross per 24 hour  Intake    480 ml  Output      0 ml  Net    480 ml    General: Sitting up in bed Cardiac: RRR, no rubs, murmurs or gallops Pulm: Clear to auscultation bilaterally, moving normal volumes of air Abd: Soft, nontender, nondistended, BS present Ext: Warm and well perfused, no pedal edema Neuro: Alert and oriented X3, slow speech, cranial nerves II through XII intact. Normal 5/5 muscle strength throughout with poor effort for left UE and LE. Skin: No rashes or lesions noted Psych: Flat affect  Lab Results: Basic Metabolic Panel:  Recent Labs Lab 08/30/14 1132 08/30/14 1137 08/31/14 0554  NA 137 140 136  K 3.3* 3.3* 3.9  CL 105 105 106  CO2 21*  --  22  GLUCOSE 102* 104* 104*  BUN 10 12 9   CREATININE 0.73 0.70 0.67  CALCIUM 9.1  --  9.1   Liver Function Tests:  Recent Labs Lab 08/30/14 1132  AST 20  ALT 15  ALKPHOS 75  BILITOT 0.4  PROT 7.7  ALBUMIN 3.9   CBC:  Recent Labs Lab 08/30/14 1132 08/30/14 1137 09/02/14 0540  WBC 7.3  --  7.5  NEUTROABS 4.3  --   --   HGB 11.0* 13.3 11.1*  HCT 33.5* 39.0 34.4*  MCV 80.7  --  81.7  PLT 401*  --  390   CBG:  Recent Labs Lab 08/30/14 1138  GLUCAP 114*   Coagulation:  Recent Labs Lab 08/30/14 1132  LABPROT  14.0  INR 1.06   Urine Drug Screen: Drugs of Abuse     Component Value Date/Time   LABOPIA NONE DETECTED 08/30/2014 1236   COCAINSCRNUR POSITIVE* 08/30/2014 1236   LABBENZ NONE DETECTED 08/30/2014 1236   AMPHETMU NONE DETECTED 08/30/2014 1236   THCU NONE DETECTED 08/30/2014 1236   LABBARB NONE DETECTED 08/30/2014 1236    Alcohol Level:  Recent Labs Lab 08/30/14 1132  ETH <5   Urinalysis:  Recent Labs Lab 08/30/14 1236  COLORURINE YELLOW  LABSPEC 1.012  PHURINE 8.5*  GLUCOSEU NEGATIVE  HGBUR NEGATIVE  BILIRUBINUR NEGATIVE  KETONESUR NEGATIVE  PROTEINUR NEGATIVE  UROBILINOGEN 0.2  NITRITE NEGATIVE  LEUKOCYTESUR NEGATIVE   Studies/Results: No results found. Medications: I have reviewed the patient's current medications. Scheduled Meds: . enoxaparin (LOVENOX) injection  40 mg Subcutaneous Q24H  . QUEtiapine  100 mg Oral QHS  . sertraline  50 mg Oral Daily  . sodium chloride  3 mL Intravenous Q12H   Continuous Infusions:  PRN Meds:.acetaminophen **OR** acetaminophen, hydrOXYzine Assessment/Plan:  Paranoid Schizophrenia: Continued improved affect, speech, and left sided extremity movement after starting anti-psychotic and SSRI  medications. Has not been on medical therapy for the past 3 years and reports recent stressors with cocaine use that likely triggered worsening of her psychiatric condition.  -Appreciate psychiatry consultation -Continue new seroquel 100 mg daily, zoloft 50 mg daily, and hydroxyzine 25 mg Q 6 hr PRN  -Monitor QTc with newly started medications (467 on last 12-lead EKG) -Pt does not meet inpatient psychiatric criteria   -SW and CM consults for psychiatry follow-up and medication assistance  -Revaluation by PT and OT now that has initiated medical therapy  -Ambulate every shift  Iron deficiency anemia - Hg 11.1 on admission below 13.8 last year. Pt denies active bleeding.  -Ferrous sulfate 325 mg daily  Hyperglycemia: -Pending  A1c  Cocaine Use - UDS positive for cocaine on admission -Counsel on cessation  -Pending HIV Ab and HCV Ab  Diet: Heart Prophylaxis: lovenox Code: FULL  Dispo: Pending PT and OT re-evaluation. Possibly today.  The patient does not have a current PCP (No Pcp Per Patient) and does need an [facility] hospital follow-up appointment after discharge.  The patient does not have transportation limitations that hinder transportation to clinic appointments.    Services Needed at time of discharge: Y = Yes, Blank = No PT:    OT:   RN:   Equipment:   Other:    Luan Moore, MD 09/02/2014, 7:48 AM

## 2014-09-03 LAB — HEMOGLOBIN A1C
Hgb A1c MFr Bld: 5.8 % — ABNORMAL HIGH (ref 4.8–5.6)
Mean Plasma Glucose: 120 mg/dL

## 2014-09-03 LAB — HEPATITIS C ANTIBODY: HCV Ab: <0.1 s/co ratio (ref 0.0–0.9)

## 2014-11-12 DIAGNOSIS — N3000 Acute cystitis without hematuria: Secondary | ICD-10-CM | POA: Insufficient documentation

## 2014-11-14 DIAGNOSIS — F4312 Post-traumatic stress disorder, chronic: Secondary | ICD-10-CM | POA: Insufficient documentation

## 2015-03-14 ENCOUNTER — Emergency Department (HOSPITAL_COMMUNITY)
Admission: EM | Admit: 2015-03-14 | Discharge: 2015-03-14 | Disposition: A | Discharge status: Home / Self Care | Payer: Medicaid Other | Attending: Emergency Medicine | Admitting: Emergency Medicine

## 2015-03-14 ENCOUNTER — Encounter (HOSPITAL_COMMUNITY): Payer: Self-pay | Admitting: Emergency Medicine

## 2015-03-14 DIAGNOSIS — K029 Dental caries, unspecified: Secondary | ICD-10-CM | POA: Diagnosis not present

## 2015-03-14 DIAGNOSIS — F17210 Nicotine dependence, cigarettes, uncomplicated: Secondary | ICD-10-CM | POA: Insufficient documentation

## 2015-03-14 DIAGNOSIS — F209 Schizophrenia, unspecified: Secondary | ICD-10-CM | POA: Insufficient documentation

## 2015-03-14 DIAGNOSIS — Z8673 Personal history of transient ischemic attack (TIA), and cerebral infarction without residual deficits: Secondary | ICD-10-CM | POA: Diagnosis not present

## 2015-03-14 DIAGNOSIS — Z79899 Other long term (current) drug therapy: Secondary | ICD-10-CM | POA: Insufficient documentation

## 2015-03-14 DIAGNOSIS — K0889 Other specified disorders of teeth and supporting structures: Secondary | ICD-10-CM | POA: Diagnosis present

## 2015-03-14 DIAGNOSIS — Z880 Allergy status to penicillin: Secondary | ICD-10-CM | POA: Insufficient documentation

## 2015-03-14 MED ORDER — LIDOCAINE VISCOUS 2 % MT SOLN
20.0000 mL | Freq: Four times a day (QID) | OROMUCOSAL | Status: DC | PRN
Start: 2015-03-14 — End: 2018-11-25

## 2015-03-14 MED ORDER — IBUPROFEN 800 MG PO TABS: 800.0000 mg | ORAL_TABLET | Freq: Three times a day (TID) | ORAL | Status: DC | PRN

## 2015-03-14 MED ORDER — LIDOCAINE VISCOUS 2 % MT SOLN
15.0000 mL | Freq: Once | OROMUCOSAL | Status: AC
  Administered 2015-03-14: 15 mL via OROMUCOSAL
  Filled 2015-03-14: qty 15

## 2015-03-14 MED ORDER — CLINDAMYCIN HCL 300 MG PO CAPS: 300.0000 mg | ORAL_CAPSULE | Freq: Three times a day (TID) | ORAL | Status: DC

## 2015-03-14 MED ORDER — CLINDAMYCIN HCL 150 MG PO CAPS
300.0000 mg | ORAL_CAPSULE | Freq: Once | ORAL | Status: AC
  Administered 2015-03-14: 300 mg via ORAL
  Filled 2015-03-14: qty 2

## 2015-03-14 MED ORDER — IBUPROFEN 800 MG PO TABS
800.0000 mg | ORAL_TABLET | Freq: Once | ORAL | Status: AC
  Administered 2015-03-14: 800 mg via ORAL
  Filled 2015-03-14: qty 1

## 2015-03-14 NOTE — ED Provider Notes (Signed)
By signing my name below, I, Hansel Feinstein, attest that this documentation has been prepared under the direction and in the presence of [place], DO. Electronically Signed: Hansel Feinstein, ED Scribe. 03/14/2015. 3:46 AM.   TIME SEEN: 3:42 AM   CHIEF COMPLAINT:  Chief Complaint  Patient presents with  . Dental Pain    HPI:  HPI Comments: Joanne Gonzalez is a 49 y.o. female with history of schizophrenia, substance abuse who presents to the Emergency Department complaining of moderate, burning, upper frontal dental pain onset yesterday. Pt states she was seen at a hospital in [place] for the same pain. It appears she is on clindamycin and Vicodin but She reports she was not started on antibiotics at that time. States the Vicodin is making her itch and vomit. She is requesting something else for pain. She has not been seen by a dentist. NKDA. She denies fever. No facial swelling or warmth. No drooling. She does not have a local dentist.  ROS: See HPI Constitutional: no fever  Eyes: no drainage  ENT: no runny nose. Positive for dental pain.  Cardiovascular:  no chest pain  Resp: no SOB  GI: no vomiting GU: no dysuria Integumentary: no rash  Allergy: no hives  Musculoskeletal: no leg swelling  Neurological: no slurred speech ROS otherwise negative  PAST MEDICAL HISTORY/PAST SURGICAL HISTORY:  Past Medical History  Diagnosis Date  . CVA (cerebral infarction)   . Seizures ([place])   . Schizophrenia ([place])     MEDICATIONS:  Prior to Admission medications   Medication Sig Start Date End Date Taking? Authorizing Provider  ferrous sulfate 325 (65 FE) MG tablet Take 1 tablet (325 mg total) by mouth daily with breakfast. 09/02/14   Luan Moore, MD  hydrOXYzine (ATARAX/VISTARIL) 25 MG tablet Take 1 tablet (25 mg total) by mouth every 6 (six) hours as needed for anxiety (agitation). 09/02/14   Luan Moore, MD  QUEtiapine (SEROQUEL) 100 MG tablet Take 1 tablet (100 mg total) by mouth at bedtime.  09/02/14   Luan Moore, MD  sertraline (ZOLOFT) 50 MG tablet Take 1 tablet (50 mg total) by mouth daily. 09/02/14   Luan Moore, MD    ALLERGIES:  Allergies  Allergen Reactions  . Keflex [Cephalexin] Hives  . Penicillins Hives  . Lactose Intolerance (Gi) Nausea And Vomiting and Rash    SOCIAL HISTORY:  Social History  Substance Use Topics  . Smoking status: Current Every Day Smoker -- 0.00 packs/day    Types: Cigarettes  . Smokeless tobacco: Not on file  . Alcohol Use: No    FAMILY HISTORY: Family History  Problem Relation Age of Onset  . Seizures Mother   . Hypertension Mother     EXAM: BP 151/97 mmHg  Pulse 91  Temp(Src) 98.1 F (36.7 C) (Oral)  Resp 16  SpO2 98% CONSTITUTIONAL: Alert and oriented and responds appropriately to questions. Well-appearing; well-nourished HEAD: Normocephalic EYES: Conjunctivae clear, PERRL ENT: normal nose; no rhinorrhea; moist mucous membranes; pharynx without lesions noted. No tonsillar hypertrophy or exudate, no uvular deviation, no trismus or drooling, normal phonation, no stridor, no dental abscess noted but multiple decayed fractured teeth especially in the upper teeth where they are fractured to the gumline, no Ludwig's angina, tongue sits flat in the bottom of the mouth, no facial swelling or warmth NECK: Supple, no meningismus, no LAD  CARD: RRR; S1 and S2 appreciated; no murmurs, no clicks, no rubs, no gallops RESP: Normal chest excursion without splinting or tachypnea; breath  sounds clear and equal bilaterally; no wheezes, no rhonchi, no rales, no hypoxia or respiratory distress, speaking full sentences ABD/GI: Normal bowel sounds; non-distended; soft, non-tender, no rebound, no guarding, no peritoneal signs BACK:  The back appears normal and is non-tender to palpation, there is no CVA tenderness EXT: Normal ROM in all joints; non-tender to palpation; no edema; normal capillary refill; no cyanosis, no calf tenderness or swelling     SKIN: Normal color for age and race; warm NEURO: Moves all extremities equally, sensation to light touch intact diffusely, cranial nerves II through XII intact PSYCH: The patient's mood and manner are appropriate. Grooming and personal hygiene are appropriate.  MEDICAL DECISION MAKING: Patient here with multiple dental caries. No sign of abscess that I can drain. No facial cellulitis. No Ludwig's angina. Afebrile, nontoxic and hemodynamically stable. Discussed with patient that our policy is to avoid narcotics and uncomplicated dental pain. We'll provide her with ibuprofen. She is allergic to penicillin. Will keep her on clindamycin. She states that she is not on Videx but appear she was given clindamycin. His question all she is taking this. Have advised her to start this new prescription and follow-up with a dentist. I have provided her with outpatient resources. We'll give her prescription for viscous lidocaine to use as needed. There is no particular area in her mouth that I am able to numb at this time given it appears all of her teeth are decayed and painful. Discussed using Orajel as needed over-the-counter. Discussed importance of follow-up. She verbalized understanding and is comfortable with this plan.  I personally performed the services described in this documentation, which was scribed in my presence. The recorded information has been reviewed and is accurate.   [place], DO 03/14/15 [phone]

## 2015-03-14 NOTE — Discharge Instructions (Signed)
Dental Care and Dentist Visits °Dental care supports good overall health. Regular dental visits can also help you avoid dental pain, bleeding, infection, and other more serious health problems in the future. It is important to keep the mouth healthy because diseases in the teeth, gums, and other oral tissues can spread to other areas of the body. Some problems, such as diabetes, heart disease, and pre-term labor have been associated with poor oral health.  °See your dentist every 6 months. If you experience emergency problems such as a toothache or broken tooth, go to the dentist right away. If you see your dentist regularly, you may catch problems early. It is easier to be treated for problems in the early stages.  °WHAT TO EXPECT AT A DENTIST VISIT  °Your dentist will look for many common oral health problems and recommend proper treatment. At your regular dental visit, you can expect: °· Gentle cleaning of the teeth and gums. This includes scraping and polishing. This helps to remove the sticky substance around the teeth and gums (plaque). Plaque forms in the mouth shortly after eating. Over time, plaque hardens on the teeth as tartar. If tartar is not removed regularly, it can cause problems. Cleaning also helps remove stains. °· Periodic X-rays. These pictures of the teeth and supporting bone will help your dentist assess the health of your teeth. °· Periodic fluoride treatments. Fluoride is a natural mineral shown to help strengthen teeth. Fluoride treatment involves applying a fluoride gel or varnish to the teeth. It is most commonly done in children. °· Examination of the mouth, tongue, jaws, teeth, and gums to look for any oral health problems, such as: °· Cavities (dental caries). This is decay on the tooth caused by plaque, sugar, and acid in the mouth. It is best to catch a cavity when it is small. °· Inflammation of the gums caused by plaque buildup (gingivitis). °· Problems with the mouth or malformed  or misaligned teeth. °· Oral cancer or other diseases of the soft tissues or jaws.  °KEEP YOUR TEETH AND GUMS HEALTHY °For healthy teeth and gums, follow these general guidelines as well as your dentist's specific advice: °· Have your teeth professionally cleaned at the dentist every 6 months. °· Brush twice daily with a fluoride toothpaste. °· Floss your teeth daily.  °· Ask your dentist if you need fluoride supplements, treatments, or fluoride toothpaste. °· Eat a healthy diet. Reduce foods and drinks with added sugar. °· Avoid smoking. °TREATMENT FOR ORAL HEALTH PROBLEMS °If you have oral health problems, treatment varies depending on the conditions present in your teeth and gums. °· Your caregiver will most likely recommend good oral hygiene at each visit. °· For cavities, gingivitis, or other oral health disease, your caregiver will perform a procedure to treat the problem. This is typically done at a separate appointment. Sometimes your caregiver will refer you to another dental specialist for specific tooth problems or for surgery. °SEEK IMMEDIATE DENTAL CARE IF: °· You have pain, bleeding, or soreness in the gum, tooth, jaw, or mouth area. °· A permanent tooth becomes loose or separated from the gum socket. °· You experience a blow or injury to the mouth or jaw area. °  °This information is not intended to replace advice given to you by your health care provider. Make sure you discuss any questions you have with your health care provider. °  °Document Released: 11/25/2010 Document Revised: 06/07/2011 Document Reviewed: 11/25/2010 °Elsevier Interactive Patient Education ©2016 Elsevier Inc. ° °Dental Caries °Dental   caries (also called tooth decay) is the most common oral disease. It can occur at any age but is more common in children and young adults.  HOW DENTAL CARIES DEVELOPS  The process of decay begins when bacteria and foods (particularly sugars and starches) combine in your mouth to produce plaque.  Plaque is a substance that sticks to the hard, outer surface of a tooth (enamel). The bacteria in plaque produce acids that attack enamel. These acids may also attack the root surface of a tooth (cementum) if it is exposed. Repeated attacks dissolve these surfaces and create holes in the tooth (cavities). If left untreated, the acids destroy the other layers of the tooth.  RISK FACTORS  Frequent sipping of sugary beverages.   Frequent snacking on sugary and starchy foods, especially those that easily get stuck in the teeth.   Poor oral hygiene.   Dry mouth.   Substance abuse such as methamphetamine abuse.   Broken or poor-fitting dental restorations.   Eating disorders.   Gastroesophageal reflux disease (GERD).   Certain radiation treatments to the head and neck. SYMPTOMS In the early stages of dental caries, symptoms are seldom present. Sometimes white, chalky areas may be seen on the enamel or other tooth layers. In later stages, symptoms may include:  Pits and holes on the enamel.  Toothache after sweet, hot, or cold foods or drinks are consumed.  Pain around the tooth.  Swelling around the tooth. DIAGNOSIS  Most of the time, dental caries is detected during a regular dental checkup. A diagnosis is made after a thorough medical and dental history is taken and the surfaces of your teeth are checked for signs of dental caries. Sometimes special instruments, such as lasers, are used to check for dental caries. Dental X-ray exams may be taken so that areas not visible to the eye (such as between the contact areas of the teeth) can be checked for cavities.  TREATMENT  If dental caries is in its early stages, it may be reversed with a fluoride treatment or an application of a remineralizing agent at the dental office. Thorough brushing and flossing at home is needed to aid these treatments. If it is in its later stages, treatment depends on the location and extent of tooth  destruction:   If a small area of the tooth has been destroyed, the destroyed area will be removed and cavities will be filled with a material such as gold, silver amalgam, or composite resin.   If a large area of the tooth has been destroyed, the destroyed area will be removed and a cap (crown) will be fitted over the remaining tooth structure.   If the center part of the tooth (pulp) is affected, a procedure called a root canal will be needed before a filling or crown can be placed.   If most of the tooth has been destroyed, the tooth may need to be pulled (extracted). HOME CARE INSTRUCTIONS You can prevent, stop, or reverse dental caries at home by practicing good oral hygiene. Good oral hygiene includes:  Thoroughly cleaning your teeth at least twice a day with a toothbrush and dental floss.   Using a fluoride toothpaste. A fluoride mouth rinse may also be used if recommended by your dentist or health care provider.   Restricting the amount of sugary and starchy foods and sugary liquids you consume.   Avoiding frequent snacking on these foods and sipping of these liquids.   Keeping regular visits with a dentist for  checkups and cleanings. PREVENTION   Practice good oral hygiene.  Consider a dental sealant. A dental sealant is a coating material that is applied by your dentist to the pits and grooves of teeth. The sealant prevents food from being trapped in them. It may protect the teeth for several years.  Ask about fluoride supplements if you live in a community without fluorinated water or with water that has a low fluoride content. Use fluoride supplements as directed by your dentist or health care provider.  Allow fluoride varnish applications to teeth if directed by your dentist or health care provider.   This information is not intended to replace advice given to you by your health care provider. Make sure you discuss any questions you have with your health care  provider.   Document Released: 12/05/2001 Document Revised: 04/05/2014 Document Reviewed: 03/17/2012 Elsevier Interactive Patient Education 2016 Elsevier Inc.   Benzocaine mouth gel, ointment, solution, or dental paste What is this medicine? BENZOCAINE (BEN zoe kane) causes loss of feeling on the skin and in the mouth. This helps relieve mouth pain. This medicine may be used for other purposes; ask your health care provider or pharmacist if you have questions. What should I tell my health care provider before I take this medicine? They need to know if you have any of these conditions: -mouth sores or infection -an unusual or allergic reaction to benzocaine, para-aminobenzoic acid (PABA), other medicines, foods, dyes, or preservatives -pregnant or trying to get pregnant -breast-feeding How should I use this medicine? This medicine is applied to the affected area of the mouth or gums. Wash your hands before and after using this medicine. Follow the directions on the label or those given to you by your doctor or health care professional. Do not use this medicine more often than directed. Talk to your pediatrician regarding the use of this medicine in children. While this medicine may be used in children as young as 4 months for selected conditions, precautions do apply. Overdosage: If you think you have taken too much of this medicine contact a poison control center or emergency room at once. NOTE: This medicine is only for you. Do not share this medicine with others. What if I miss a dose? This does not apply. What may interact with this medicine? -sulfonamides like sulfacetamide, sulfamethoxazole, sulfisoxazole, and others This list may not describe all possible interactions. Give your health care provider a list of all the medicines, herbs, non-prescription drugs, or dietary supplements you use. Also tell them if you smoke, drink alcohol, or use illegal drugs. Some items may interact with  your medicine. What should I watch for while using this medicine? This medicine is not for long-term use. Do not use for longer than directed on the label or your doctor or health care professional. Do not use on large areas of broken or damaged skin. Contact your doctor or health care professional if your condition does not start to get better within a few days or if you notice redness, itching or swelling. The affected area of your mouth will be numb. Try to avoid injury to that area. To avoid biting the tongue or cheek, or difficulty swallowing, do not chew gum or food until the numbness wears off. What side effects may I notice from receiving this medicine? Side effects that you should report to your doctor or health care professional as soon as possible: -allergic reactions like skin rash, itching or hives, swelling of the face, lips, or tongue -  breathing problems -dizziness or drowsiness -fast or slow heartbeat -headache -increased sweating -restlessness, nervousness, anxiety -seizures -tremor Side effects that usually do not require medical attention (report to your doctor or health care professional if they continue or are bothersome): -redness, swelling, or pain This list may not describe all possible side effects. Call your doctor for medical advice about side effects. You may report side effects to FDA at 1-800-FDA-1088. Where should I keep my medicine? Keep out of the reach of children. Store at room temperature between 15 and 30 degrees C (59 and 86 degrees F). Do not freeze. Throw away any unused medicine after the expiration date. NOTE: This sheet is a summary. It may not cover all possible information. If you have questions about this medicine, talk to your doctor, pharmacist, or health care provider.    2016, Elsevier/Gold Standard. (2007-06-14 14:49:13)    Emergency Department Resource Guide 1) Find a Doctor and Pay Out of Pocket Although you won't have to find out who  is covered by your insurance plan, it is a good idea to ask around and get recommendations. You will then need to call the office and see if the doctor you have chosen will accept you as a new patient and what types of options they offer for patients who are self-pay. Some doctors offer discounts or will set up payment plans for their patients who do not have insurance, but you will need to ask so you aren't surprised when you get to your appointment.  2) Contact Your Local Health Department Not all health departments have doctors that can see patients for sick visits, but many do, so it is worth a call to see if yours does. If you don't know where your local health department is, you can check in your phone book. The CDC also has a tool to help you locate your state's health department, and many state websites also have listings of all of their local health departments.  3) Find a [facility] If your illness is not likely to be very severe or complicated, you may want to try a walk in clinic. These are popping up all over the country in pharmacies, drugstores, and shopping centers. They're usually staffed by nurse practitioners or physician assistants that have been trained to treat common illnesses and complaints. They're usually fairly quick and inexpensive. However, if you have serious medical issues or chronic medical problems, these are probably not your best option.  No Primary Care Doctor: - Call Health Connect at  [phone] - they can help you locate a primary care doctor that  accepts your insurance, provides certain services, etc. - Physician Referral Service- [phone]  Chronic Pain Problems: Organization         Address  Phone   Notes  [facility]  [phone] Patients need to be referred by their primary care doctor.   Medication Assistance: Organization         Address  Phone   Notes  [facility] Medication [facility] [address]  [place], [place] [address] [phone] --Must be a resident of [facility] -- Must have NO insurance coverage whatsoever (no Medicaid/ Medicare, etc.) -- The pt. MUST have a primary care doctor that directs their care regularly and follows them in the community   [facility]ist  [phone]   [employer]  [phone]    Agencies that provide inexpensive medical care: Organization  Address  Phone   Notes  [place]  [phone]   [facility]    [phone]   [facility] [place], [place] [address] [phone]   [place] [address] [place] [place] [phone]   Planned Parenthood    [phone]   [facility]    [phone]   [place] and [place] [address], [place] Phone:  [phone], Fax:  [phone] Hours of Operation:  9 am - 6 pm, M-F.  Also accepts Medicaid/Medicare and self-pay.  [facility] for [place], [address], [place] Phone: [phone], Fax: [phone]. Hours of Operation:  8:30 am - 5:30 pm, M-F.  Also accepts Medicaid and self-pay.  [facility] [place] [address], [place] Phone: [phone]   [place], [place], [place] [phone], Ext. [phone] Mondays & Thursdays: 7-9 AM.  First 15 patients are seen on a first come, first serve basis.    [place] Providers:  Organization         Address  Phone   Notes  [facility] [address], [place] [phone] Also accepts self-pay patients.  [facility] [id number] [place], [place]  [phone]   [place], [address], [place] [phone]   [facility] [address], [place] [phone]   Lucianne Lei [address], [place]   [phone] Only accepts [place] Access [place] patients after they have their name applied to their card.   Self-Pay (no insurance) in [facility]:  Organization         Address  Phone   Notes  Sickle Cell Patients, [facility] [place] [phone]   [facility] [place] [phone]   [facility] [place]  [place], [place], [place] [phone]   [facility]/Dr. Osei-Bonsu  [address] [place] or [address], [place] [phone] Phone number for both [place] and [place] locations is the same.  [facility] and [facility] [address], [place] [phone]   [facility] [address], [place] or [address] [phone]   [facility] [address] [place] [phone], phone; [phone], fax Sees patients 1st and 3rd Saturday of every month.  Must not qualify for public or private insurance (i.e. Medicaid, Medicare, [place] Health Choice, Veterans' Benefits)  Household income should be no more than 200% of the poverty level The clinic cannot treat you if you are pregnant or think you are pregnant  Sexually transmitted diseases are not treated at the clinic.    Dental Care: Organization         Address  Phone  Notes  [facility] Department of [facility] [place] [phone] Accepts children up to age 17 who are enrolled in [place] or [place]; pregnant women with a Medicaid card; and children who have applied for Medicaid or [place] Health Choice, but were declined, whose parents can pay a reduced fee at time of  service.  [facility] Department of [facility]  [address], [place] [phone] Accepts children up to age 31 who are  enrolled in [place] or [place]; pregnant women with a Medicaid card; and children who have applied for Medicaid or [place] Health Choice, but were declined, whose parents can pay a reduced fee at time of service.  [facility] Adult [facility] Access PROGRAM  [place] [phone] Patients are seen by appointment only. Walk-ins are not accepted. [place] will see patients 67 years of age and older. Monday - Tuesday (8am-5pm) Most Wednesdays (8:30-5pm) $30 per visit, cash only  [facility] Adult Dental Access PROGRAM  [address], [facility] [phone] Patients are seen by appointment only. Walk-ins are not accepted. [place] will see patients 73 years of age and older. One Wednesday Evening (Monthly: Volunteer Based).  $30 per visit, cash only  [place]  [phone] for adults; Children under age 59, call Graduate Pediatric Dentistry at [phone]. Children aged 17-14, please call [phone] to request a pediatric application.  Dental services are provided in all areas of dental care including fillings, crowns and bridges, complete and partial dentures, implants, gum treatment, root canals, and extractions. Preventive care is also provided. Treatment is provided to both adults and children. Patients are selected via a lottery and there is often a waiting list.   [facility] [address], [place]  [phone] [email or web address]   [facility] [address] [place], [place] [phone], Ext. [phone] Second and Fourth Thursday of each month, opens at 6:30 AM; Clinic ends at 9 AM.  Patients are seen on a first-come first-served basis, and a limited number are seen during each clinic.   [facility]  [address] Hillard Danker [place], [place] [phone]   Eligibility Requirements You must have lived in [place], [place], or [place] counties for at least the last three months.   You  cannot be eligible for state or federal sponsored [employer], including [employer], [place], or [employer].   You generally cannot be eligible for healthcare insurance through your employer.    How to apply: Eligibility screenings are held every Tuesday and Wednesday afternoon from 1:00 pm until 4:00 pm. You do not need an appointment for the interview!  [facility] [address], [place], [place]   [place]  [place] Department  [place]  [phone]    Behavioral Health Resources in the Community: Intensive Outpatient Programs Organization         Address  Phone  Notes  [place]. [address] [place], [place] [phone]   [facility] Outpatient [address], [place], [place]   ADS: [facility] [address], [place], [place]   [place] [address]  [place], [place] or [phone]   Substance Abuse Resources Organization         Address  Phone  Notes  Alcohol and Drug Services  [phone]   [place]  [phone]   [facility] [place]  [phone]   Chinita Pester  [phone]   Residential & Outpatient Substance Abuse Program  [phone]   Psychological Services Organization         Address  Phone  Notes  [facility]  [place]   [place] [address], [place] or [phone]    Mobile Crisis Teams Organization         Address  Phone  Notes  [facility], Mobile Crisis Care Unit  [phone]   [facility]  [address] [place], [place]   [place] Levels [address], [place] [phone]    Self-Help/Support  Groups Organization         Address  Phone             Notes  [place]. of [place] - variety of support groups  [place] Call for more information  Narcotics Anonymous (NA), [facility] [address], [employer] [place]  2 meetings at this location   Special educational needs teacher         Address  Phone  Notes  [facility]AP Residential Treatment [place],    [place]  [phone]   [facility]  [address] [place] [id number], [place], [place]   [place], [place] [phone] Admissions: 8am-3pm M-F  Incentives Substance [place] [address],    [place], [place] [phone]   [facility] [address] [place], [place], [place]   [facility] [address],  [place], [place]   [facility] - Intensive Outpatient [address], [place], [place]   [facility] [place].,  [place], [place] [phone] or [phone]   [facility] (RTS) [address] [place], [place] Accepts Medicaid  [facility] [place] [address],  [place] [phone] Substance Abuse/Addiction Treatment   [facility] Organization         Address  Phone  Notes  [facility]  [phone]   Domenic Schwab, PhD [address] Arlis Porta [place], [place]   [phone] or [phone]   [place], [place] [phone]   [facility] [address] [place], [place] [phone] Insurance/Medicaid/sponsorship through [facility] and Families [address] [place]                                    [place], [place] [phone] [place] [address], [place] [phone]    Dr. Adele Schilder  [phone]   [facility] Dept. 1) [address], [place] 2) [place] 3)  [place] [address], [place] [phone]  [phone]   [place] [phone] or [phone] (After Hours)

## 2015-03-14 NOTE — ED Notes (Signed)
Pt. reports upper front dental pain onset yesterday " it broke off" .

## 2015-09-26 DIAGNOSIS — R079 Chest pain, unspecified: Secondary | ICD-10-CM | POA: Insufficient documentation

## 2015-09-26 DIAGNOSIS — Z8673 Personal history of transient ischemic attack (TIA), and cerebral infarction without residual deficits: Secondary | ICD-10-CM | POA: Insufficient documentation

## 2016-01-21 DIAGNOSIS — Z720 Tobacco use: Secondary | ICD-10-CM | POA: Insufficient documentation

## 2016-01-23 DIAGNOSIS — F449 Dissociative and conversion disorder, unspecified: Secondary | ICD-10-CM | POA: Insufficient documentation

## 2018-06-24 DIAGNOSIS — W57XXXA Bitten or stung by nonvenomous insect and other nonvenomous arthropods, initial encounter: Secondary | ICD-10-CM | POA: Insufficient documentation

## 2018-11-25 ENCOUNTER — Emergency Department (HOSPITAL_COMMUNITY)
Admission: EM | Admit: 2018-11-25 | Discharge: 2018-11-26 | Disposition: A | Discharge status: Home / Self Care | Payer: Medicaid Other | Attending: Emergency Medicine | Admitting: Emergency Medicine

## 2018-11-25 ENCOUNTER — Emergency Department (HOSPITAL_COMMUNITY): Payer: Medicaid Other

## 2018-11-25 ENCOUNTER — Encounter (HOSPITAL_COMMUNITY): Payer: Self-pay

## 2018-11-25 ENCOUNTER — Other Ambulatory Visit: Payer: Self-pay

## 2018-11-25 DIAGNOSIS — F17210 Nicotine dependence, cigarettes, uncomplicated: Secondary | ICD-10-CM | POA: Diagnosis not present

## 2018-11-25 DIAGNOSIS — Z5900 Homelessness unspecified: Secondary | ICD-10-CM

## 2018-11-25 DIAGNOSIS — R079 Chest pain, unspecified: Secondary | ICD-10-CM | POA: Insufficient documentation

## 2018-11-25 DIAGNOSIS — F203 Undifferentiated schizophrenia: Secondary | ICD-10-CM | POA: Diagnosis present

## 2018-11-25 DIAGNOSIS — F200 Paranoid schizophrenia: Secondary | ICD-10-CM | POA: Diagnosis present

## 2018-11-25 DIAGNOSIS — R45851 Suicidal ideations: Secondary | ICD-10-CM

## 2018-11-25 DIAGNOSIS — F1420 Cocaine dependence, uncomplicated: Secondary | ICD-10-CM | POA: Diagnosis not present

## 2018-11-25 DIAGNOSIS — F1490 Cocaine use, unspecified, uncomplicated: Secondary | ICD-10-CM | POA: Diagnosis not present

## 2018-11-25 DIAGNOSIS — Z8673 Personal history of transient ischemic attack (TIA), and cerebral infarction without residual deficits: Secondary | ICD-10-CM | POA: Insufficient documentation

## 2018-11-25 DIAGNOSIS — F4321 Adjustment disorder with depressed mood: Secondary | ICD-10-CM | POA: Diagnosis not present

## 2018-11-25 DIAGNOSIS — Z79899 Other long term (current) drug therapy: Secondary | ICD-10-CM | POA: Diagnosis not present

## 2018-11-25 DIAGNOSIS — F209 Schizophrenia, unspecified: Secondary | ICD-10-CM | POA: Diagnosis present

## 2018-11-25 DIAGNOSIS — F1910 Other psychoactive substance abuse, uncomplicated: Secondary | ICD-10-CM

## 2018-11-25 DIAGNOSIS — Z008 Encounter for other general examination: Secondary | ICD-10-CM | POA: Insufficient documentation

## 2018-11-25 DIAGNOSIS — Z20828 Contact with and (suspected) exposure to other viral communicable diseases: Secondary | ICD-10-CM | POA: Diagnosis not present

## 2018-11-25 DIAGNOSIS — R45850 Homicidal ideations: Secondary | ICD-10-CM | POA: Diagnosis not present

## 2018-11-25 DIAGNOSIS — F201 Disorganized schizophrenia: Secondary | ICD-10-CM | POA: Diagnosis not present

## 2018-11-25 LAB — I-STAT BETA HCG BLOOD, ED (MC, WL, AP ONLY): I-stat hCG, quantitative: <5 m[IU]/mL

## 2018-11-25 LAB — COMPREHENSIVE METABOLIC PANEL
ALT: 13 U/L (ref 0–44)
AST: 18 U/L (ref 15–41)
Albumin: 4.1 g/dL (ref 3.5–5.0)
Alkaline Phosphatase: 75 U/L (ref 38–126)
Anion gap: 12 (ref 5–15)
BUN: 5 mg/dL — ABNORMAL LOW (ref 6–20)
CO2: 20 mmol/L — ABNORMAL LOW (ref 22–32)
Calcium: 9.3 mg/dL (ref 8.9–10.3)
Chloride: 102 mmol/L (ref 98–111)
Creatinine, Ser: 0.72 mg/dL (ref 0.44–1.00)
GFR calc Af Amer: >60 mL/min (ref >60)
GFR calc non Af Amer: >60 mL/min (ref >60)
Glucose, Bld: 92 mg/dL (ref 70–99)
Potassium: 3.3 mmol/L — ABNORMAL LOW (ref 3.5–5.1)
Sodium: 134 mmol/L — ABNORMAL LOW (ref 135–145)
Total Bilirubin: 0.5 mg/dL (ref 0.3–1.2)
Total Protein: 8 g/dL (ref 6.5–8.1)

## 2018-11-25 LAB — CBC
HCT: 39.3 % (ref 36.0–46.0)
Hemoglobin: 12.8 g/dL (ref 12.0–15.0)
MCH: 28.9 pg (ref 26.0–34.0)
MCHC: 32.6 g/dL (ref 30.0–36.0)
MCV: 88.7 fL (ref 80.0–100.0)
Platelets: 336 10*3/uL (ref 150–400)
RBC: 4.43 MIL/uL (ref 3.87–5.11)
RDW: 14.7 % (ref 11.5–15.5)
WBC: 9.2 10*3/uL (ref 4.0–10.5)
nRBC: 0 % (ref 0.0–0.2)

## 2018-11-25 LAB — RAPID URINE DRUG SCREEN, HOSP PERFORMED
Amphetamines: NOT DETECTED
Barbiturates: NOT DETECTED
Benzodiazepines: NOT DETECTED
Cocaine: POSITIVE — AB
Opiates: NOT DETECTED
Tetrahydrocannabinol: NOT DETECTED

## 2018-11-25 LAB — SARS CORONAVIRUS 2 (TAT 6-24 HRS): SARS Coronavirus 2: NEGATIVE

## 2018-11-25 LAB — ETHANOL: Alcohol, Ethyl (B): <10 mg/dL (ref <10)

## 2018-11-25 LAB — TROPONIN I (HIGH SENSITIVITY): Troponin I (High Sensitivity): 6 ng/L

## 2018-11-25 LAB — SALICYLATE LEVEL: Salicylate Lvl: <7 mg/dL (ref 2.8–30.0)

## 2018-11-25 LAB — ACETAMINOPHEN LEVEL: Acetaminophen (Tylenol), Serum: <10 ug/mL — ABNORMAL LOW (ref 10–30)

## 2018-11-25 MED ORDER — TRAZODONE HCL 100 MG PO TABS
100.0000 mg | ORAL_TABLET | Freq: Every day | ORAL | Status: DC
  Filled 2018-11-25: qty 1

## 2018-11-25 MED ORDER — RISPERIDONE 2 MG PO TABS: 2.0000 mg | ORAL_TABLET | Freq: Every evening | ORAL | Status: DC

## 2018-11-25 MED ORDER — LORAZEPAM 1 MG PO TABS
1.0000 mg | ORAL_TABLET | ORAL | Status: DC | PRN
  Administered 2018-11-25: 22:00:00 1 mg via ORAL
  Filled 2018-11-25: qty 1

## 2018-11-25 MED ORDER — IBUPROFEN 400 MG PO TABS
600.0000 mg | ORAL_TABLET | Freq: Three times a day (TID) | ORAL | Status: DC | PRN
  Administered 2018-11-25 (×2): 600 mg via ORAL
  Filled 2018-11-25 (×2): qty 1

## 2018-11-25 MED ORDER — TRAZODONE HCL 100 MG PO TABS
100.0000 mg | ORAL_TABLET | Freq: Once | ORAL | Status: AC
  Administered 2018-11-25: 100 mg via ORAL

## 2018-11-25 MED ORDER — QUETIAPINE FUMARATE 50 MG PO TABS
100.0000 mg | ORAL_TABLET | Freq: Every day | ORAL | Status: DC
  Administered 2018-11-26: 100 mg via ORAL
  Filled 2018-11-25 (×2): qty 2

## 2018-11-25 NOTE — ED Notes (Signed)
Pt made call from nurses' desk.

## 2018-11-25 NOTE — ED Notes (Signed)
Pt ambulated to the restroom with steady gait and took her shower for the day.

## 2018-11-25 NOTE — ED Triage Notes (Signed)
Pt comes via [facility] for CP with radiation to L arm, after using crack and smoked a joint today, PTA received 324 ASA and one nitro, pt also having auditory hallucination, pt also endorses SI, pt with flight of ideas, rambling in triage.

## 2018-11-25 NOTE — ED Notes (Addendum)
Pt in shower. Stated started her menstrual cycle.
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
## 2018-11-25 NOTE — ED Notes (Signed)
Pt arrived to Rm 51 via stretcher. Pt wearing burgundy scrubs. Sitter w/pt.

## 2018-11-25 NOTE — ED Notes (Signed)
Patient presents to ed via [facility] states she has been drug clean  For almost 10 years however states she has relapsed  X 2 in those 10 years. statse

## 2018-11-25 NOTE — ED Notes (Signed)
Meal tray at bedside.  
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
## 2018-11-25 NOTE — ED Provider Notes (Signed)
[facility] Provider Note   CSN: [id number] Arrival date & time: 11/25/18  0542     History   Chief Complaint Chief Complaint  Patient presents with  . Chest Pain  . Suicidal   LEVEL 5 CAVEAT DUE TO PSYCHIATRIC DISORDER  HPI Jim Beckstrom is a 53 y.o. female.     The history is provided by the patient.  Mental Health Problem Presenting symptoms: hallucinations, homicidal ideas, paranoid behavior and suicidal thoughts   Degree of incapacity (severity):  Severe Onset quality:  Sudden Timing:  Constant Progression:  Worsening Chronicity:  Recurrent Context: drug abuse   Relieved by:  Nothing Worsened by:  Drugs Associated symptoms: chest pain    Patient with history of schizophrenia, previous stroke presents with substance abuse as well as paranoia She reports she relapsed and started using crack cocaine as well as marijuana.  She began having auditory hallucinations, she is having thoughts of harming herself and she wants to harm others.  She reports has been having chest pain even preceding the cocaine use.  It has been constant. Patient reports she has been off her psychiatric meds for 2 months Past Medical History:  Diagnosis Date  . CVA (cerebral infarction)   . Schizophrenia ([place])   . Seizures [facility])     Patient Active Problem List   Diagnosis Date Noted  . Iron deficiency anemia 09/01/2014  . Cocaine use 09/01/2014  . Hyperglycemia 09/01/2014  . Schizophrenia, paranoid ([place]) 08/31/2014  . Left-sided weakness 08/30/2014  . Hemiplegia, unspecified, affecting nondominant side 04/13/2013    Past Surgical History:  Procedure Laterality Date  . LIMB SPARING RESECTION HIP W/ SADDLE JOINT REPLACEMENT       OB History   No obstetric history on file.      Home Medications    Prior to Admission medications   Medication Sig Start Date End Date Taking? Authorizing Provider  clindamycin (CLEOCIN) 300 MG capsule Take 1  capsule (300 mg total) by mouth 3 (three) times daily. 03/14/15   Ward, Delice Bison, DO  ibuprofen (ADVIL,MOTRIN) 800 MG tablet Take 1 tablet (800 mg total) by mouth every 8 (eight) hours as needed for mild pain. 03/14/15   Ward, Delice Bison, DO  lidocaine (XYLOCAINE) 2 % solution Use as directed 20 mLs in the mouth or throat every 6 (six) hours as needed for mouth pain. 03/14/15   Ward, Delice Bison, DO  risperiDONE (RISPERDAL) 2 MG tablet Take 2 mg by mouth every evening. 03/08/15 04/07/15  [provider]  ferrous sulfate 325 (65 FE) MG tablet Take 1 tablet (325 mg total) by mouth daily with breakfast. Patient not taking: Reported on 03/14/2015 09/02/14 03/14/15  Luan Moore, MD  sertraline (ZOLOFT) 50 MG tablet Take 1 tablet (50 mg total) by mouth daily. Patient not taking: Reported on 03/14/2015 09/02/14 03/14/15  Luan Moore, MD    Family History Family History  Problem Relation Age of Onset  . Seizures Mother   . Hypertension Mother     Social History Social History   Tobacco Use  . Smoking status: Current Every Day Smoker    Packs/day: 0.00    Types: Cigarettes  . Smokeless tobacco: Never Used  Substance Use Topics  . Alcohol use: No  . Drug use: Yes    Types: Cocaine, Marijuana     Allergies   Keflex [cephalexin], Penicillins, and Lactose intolerance (gi)   Review of Systems Review of Systems  Unable to perform ROS:  Psychiatric disorder  Cardiovascular: Positive for chest pain.  Psychiatric/Behavioral: Positive for hallucinations, homicidal ideas, paranoia and suicidal ideas.     Physical Exam Updated Vital Signs BP 114/85 (BP Location: Right Arm)   Pulse 74   Temp 98.6 F (37 C) (Oral)   Resp 20   SpO2 100%   Physical Exam  CONSTITUTIONAL: disheveled and anxious HEAD: Normocephalic/atraumatic EYES: EOMI/PERRL NECK: supple no meningeal signs SPINE/BACK:entire spine nontender CV: S1/S2 noted, no murmurs/rubs/gallops noted LUNGS: Lungs are clear to  auscultation bilaterally, no apparent distress ABDOMEN: soft, nontender  NEURO: Pt is awake/alert/appropriate, moves all extremitiesx4.  No facial droop.  EXTREMITIES: pulses normal/equal, full ROM SKIN: warm, color normal PSYCH: anxious  ED Treatments / Results  Labs (all labs ordered are listed, but only abnormal results are displayed) Labs Reviewed  COMPREHENSIVE METABOLIC PANEL - Abnormal; Notable for the following components:      Result Value   Sodium 134 (*)    Potassium 3.3 (*)    CO2 20 (*)    BUN 5 (*)    All other components within normal limits  ACETAMINOPHEN LEVEL - Abnormal; Notable for the following components:   Acetaminophen (Tylenol), Serum <10 (*)    All other components within normal limits  ETHANOL  SALICYLATE LEVEL  CBC  RAPID URINE DRUG SCREEN, HOSP PERFORMED  I-STAT BETA HCG BLOOD, ED ([facility], [facility], [facility] ONLY)  I-STAT BETA HCG BLOOD, ED ([facility], [facility], [facility] ONLY)  TROPONIN I (HIGH SENSITIVITY)    EKG EKG Interpretation  Date/Time:  Saturday November 25 2018 05:51:16 EDT Ventricular Rate:  80 PR Interval:  130 QRS Duration: 72 QT Interval:  392 QTC Calculation: 452 R Axis:   45 Text Interpretation:  Normal sinus rhythm Moderate voltage criteria for LVH, may be normal variant Borderline ECG No significant change since last tracing Interpretation limited secondary to artifact Confirmed by Ripley Fraise [phone]) on 11/25/2018 6:07:08 AM   Radiology Dg Chest 2 View  Result Date: 11/25/2018 CLINICAL DATA:  Chest pain EXAM: CHEST - 2 VIEW COMPARISON:  None. FINDINGS: The heart size and mediastinal contours are within normal limits. Both lungs are clear. The visualized skeletal structures are unremarkable. IMPRESSION: No active cardiopulmonary disease. Electronically Signed   By: Ulyses Jarred M.D.   On: 11/25/2018 06:34    Procedures Procedures   Medications Ordered in ED Medications  ibuprofen (ADVIL) tablet 600 mg (has no administration in time range)  LORazepam  (ATIVAN) tablet 1 mg (has no administration in time range)  risperiDONE (RISPERDAL) tablet 2 mg (has no administration in time range)     Initial Impression / Assessment and Plan / ED Course  I have reviewed the triage vital signs and the nursing notes.  Pertinent labs & imaging results that were available during my care of the patient were reviewed by me and considered in my medical decision making (see chart for details).       7:08 AM Patient here after relapsing on crack cocaine and having hallucinations and suicidal ideation.  She also had thoughts of harming others. She has reported chest pain, but this  Proceeded cocaine use.  No EKG changes. Low suspicion for ACS/PE/dissection.  Patient medically stable at this time  Final Clinical Impressions(s) / ED Diagnoses   Final diagnoses:  Suicidal ideation  Substance abuse [facility])    ED Discharge Orders    None       Ripley Fraise, MD 11/25/18 0710

## 2018-11-25 NOTE — ED Notes (Signed)
Breakfast tray ordered 

## 2018-11-25 NOTE — ED Notes (Addendum)
Pt aware we need urine. States "I can't pee right now."

## 2018-11-26 ENCOUNTER — Encounter (HOSPITAL_COMMUNITY): Payer: Self-pay | Admitting: Registered Nurse

## 2018-11-26 DIAGNOSIS — F1490 Cocaine use, unspecified, uncomplicated: Secondary | ICD-10-CM

## 2018-11-26 DIAGNOSIS — F200 Paranoid schizophrenia: Secondary | ICD-10-CM

## 2018-11-26 DIAGNOSIS — Z915 Personal history of self-harm: Secondary | ICD-10-CM

## 2018-11-26 DIAGNOSIS — F17210 Nicotine dependence, cigarettes, uncomplicated: Secondary | ICD-10-CM

## 2018-11-26 DIAGNOSIS — Z5900 Homelessness unspecified: Secondary | ICD-10-CM

## 2018-11-26 DIAGNOSIS — F1910 Other psychoactive substance abuse, uncomplicated: Secondary | ICD-10-CM | POA: Insufficient documentation

## 2018-11-26 DIAGNOSIS — F4321 Adjustment disorder with depressed mood: Secondary | ICD-10-CM | POA: Diagnosis not present

## 2018-11-26 MED ORDER — QUETIAPINE FUMARATE 100 MG PO TABS: 100.0000 mg | ORAL_TABLET | Freq: Every day | ORAL | 0 refills | Status: DC

## 2018-11-26 MED ORDER — TRAZODONE HCL 100 MG PO TABS: 100.0000 mg | ORAL_TABLET | Freq: Every day | ORAL | 0 refills | Status: DC

## 2018-11-26 NOTE — ED Notes (Signed)
Telepsych being performed. 
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
## 2018-11-26 NOTE — ED Notes (Signed)
Gave pt a cup of ice, per [facility] - Therapist, sports.

## 2018-11-26 NOTE — Progress Notes (Signed)
CSW faxed over outpatient resources for patient discharge.   Chalmers Guest. Guerry Bruin, MSW, [place] Work/Disposition Phone: [phone] Fax: [phone]

## 2018-11-26 NOTE — ED Notes (Signed)
D/C instructions given and questions answered to satisfaction. Resources discussed and given - voiced understanding of following up and taking rx's that were sent to her pharmacy. ALL belongings - 2 labeled belongings bags and 1 valuables envelope - returned to pt - Pt signed verifying all items present.

## 2018-11-26 NOTE — ED Notes (Addendum)
Per [place], [facility] NP, pt has been psych cleared - will need rx's for Seroquel and Trazodone. Advised will enter note shortly.

## 2018-12-11 ENCOUNTER — Emergency Department (HOSPITAL_COMMUNITY)
Admission: EM | Admit: 2018-12-11 | Discharge: 2018-12-11 | Disposition: A | Discharge status: Home / Self Care | Payer: Medicaid Other | Attending: Emergency Medicine | Admitting: Emergency Medicine

## 2018-12-11 ENCOUNTER — Encounter (HOSPITAL_COMMUNITY): Payer: Self-pay

## 2018-12-11 ENCOUNTER — Other Ambulatory Visit: Payer: Self-pay

## 2018-12-11 DIAGNOSIS — X58XXXA Exposure to other specified factors, initial encounter: Secondary | ICD-10-CM | POA: Insufficient documentation

## 2018-12-11 DIAGNOSIS — Z5321 Procedure and treatment not carried out due to patient leaving prior to being seen by health care provider: Secondary | ICD-10-CM | POA: Insufficient documentation

## 2018-12-11 DIAGNOSIS — T1592XA Foreign body on external eye, part unspecified, left eye, initial encounter: Secondary | ICD-10-CM | POA: Diagnosis present

## 2018-12-11 DIAGNOSIS — Y999 Unspecified external cause status: Secondary | ICD-10-CM | POA: Insufficient documentation

## 2018-12-11 DIAGNOSIS — Y939 Activity, unspecified: Secondary | ICD-10-CM | POA: Diagnosis not present

## 2018-12-11 DIAGNOSIS — Y929 Unspecified place or not applicable: Secondary | ICD-10-CM | POA: Diagnosis not present

## 2018-12-11 NOTE — ED Triage Notes (Signed)
Pt arrives POV for eval of foreign body sensation to L eye, onset 2 days PTA. Pt states she has tried to get it out with a cutip unsuccessfully. Eye is reddened and irritated.

## 2018-12-11 NOTE — ED Notes (Signed)
Pt asked what wait time was, advised about 1 hour, but maybe sooner if we could move her through fast track. Pt stated "f**k this, I'm leaving I can't wait". Encouraged her to stay as she would likely be seen sooner in fast track. Pt stated "fast track my ass", made obscene gesture and seen leaving premises. Encouraged to return should emergent medical condition necessitate medical evaluation.

## 2019-01-14 ENCOUNTER — Encounter (HOSPITAL_COMMUNITY): Payer: Self-pay | Admitting: Emergency Medicine

## 2019-01-14 ENCOUNTER — Other Ambulatory Visit: Payer: Self-pay

## 2019-01-14 ENCOUNTER — Emergency Department (HOSPITAL_COMMUNITY)
Admission: EM | Admit: 2019-01-14 | Discharge: 2019-01-14 | Disposition: A | Discharge status: Home / Self Care | Payer: Medicaid Other | Attending: Emergency Medicine | Admitting: Emergency Medicine

## 2019-01-14 DIAGNOSIS — F17210 Nicotine dependence, cigarettes, uncomplicated: Secondary | ICD-10-CM | POA: Insufficient documentation

## 2019-01-14 DIAGNOSIS — Z79899 Other long term (current) drug therapy: Secondary | ICD-10-CM | POA: Insufficient documentation

## 2019-01-14 DIAGNOSIS — Z590 Homelessness: Secondary | ICD-10-CM | POA: Diagnosis not present

## 2019-01-14 DIAGNOSIS — F10920 Alcohol use, unspecified with intoxication, uncomplicated: Secondary | ICD-10-CM | POA: Insufficient documentation

## 2019-01-14 DIAGNOSIS — F1910 Other psychoactive substance abuse, uncomplicated: Secondary | ICD-10-CM | POA: Diagnosis present

## 2019-01-14 DIAGNOSIS — N3000 Acute cystitis without hematuria: Secondary | ICD-10-CM | POA: Diagnosis not present

## 2019-01-14 LAB — URINALYSIS, ROUTINE W REFLEX MICROSCOPIC
Bilirubin Urine: NEGATIVE
Glucose, UA: NEGATIVE mg/dL
Ketones, ur: NEGATIVE mg/dL
Leukocytes,Ua: NEGATIVE
Nitrite: POSITIVE — AB
Protein, ur: NEGATIVE mg/dL
Specific Gravity, Urine: 1.013 (ref 1.005–1.030)
pH: 6 (ref 5.0–8.0)

## 2019-01-14 LAB — COMPREHENSIVE METABOLIC PANEL
ALT: 15 U/L (ref 0–44)
AST: 21 U/L (ref 15–41)
Albumin: 4.2 g/dL (ref 3.5–5.0)
Alkaline Phosphatase: 73 U/L (ref 38–126)
Anion gap: 10 (ref 5–15)
BUN: 17 mg/dL (ref 6–20)
CO2: 23 mmol/L (ref 22–32)
Calcium: 9.3 mg/dL (ref 8.9–10.3)
Chloride: 105 mmol/L (ref 98–111)
Creatinine, Ser: 0.73 mg/dL (ref 0.44–1.00)
GFR calc Af Amer: >60 mL/min (ref >60)
GFR calc non Af Amer: >60 mL/min (ref >60)
Glucose, Bld: 99 mg/dL (ref 70–99)
Potassium: 4.1 mmol/L (ref 3.5–5.1)
Sodium: 138 mmol/L (ref 135–145)
Total Bilirubin: 0.4 mg/dL (ref 0.3–1.2)
Total Protein: 8.5 g/dL — ABNORMAL HIGH (ref 6.5–8.1)

## 2019-01-14 LAB — RAPID URINE DRUG SCREEN, HOSP PERFORMED
Amphetamines: NOT DETECTED
Barbiturates: NOT DETECTED
Benzodiazepines: NOT DETECTED
Cocaine: POSITIVE — AB
Opiates: NOT DETECTED
Tetrahydrocannabinol: NOT DETECTED

## 2019-01-14 LAB — CBC
HCT: 37.2 % (ref 36.0–46.0)
Hemoglobin: 11.6 g/dL — ABNORMAL LOW (ref 12.0–15.0)
MCH: 27.6 pg (ref 26.0–34.0)
MCHC: 31.2 g/dL (ref 30.0–36.0)
MCV: 88.6 fL (ref 80.0–100.0)
Platelets: 320 10*3/uL (ref 150–400)
RBC: 4.2 MIL/uL (ref 3.87–5.11)
RDW: 14.7 % (ref 11.5–15.5)
WBC: 10.9 10*3/uL — ABNORMAL HIGH (ref 4.0–10.5)
nRBC: 0 % (ref 0.0–0.2)

## 2019-01-14 MED ORDER — NITROFURANTOIN MONOHYD MACRO 100 MG PO CAPS: 100.0000 mg | ORAL_CAPSULE | Freq: Two times a day (BID) | ORAL | 0 refills | Status: DC

## 2019-01-14 MED ORDER — ONDANSETRON HCL 4 MG/2ML IJ SOLN
4.0000 mg | Freq: Once | INTRAMUSCULAR | Status: AC
  Administered 2019-01-14: 07:00:00 4 mg via INTRAVENOUS
  Filled 2019-01-14: qty 2

## 2019-01-14 MED ORDER — SODIUM CHLORIDE 0.9 % IV BOLUS
1000.0000 mL | Freq: Once | INTRAVENOUS | Status: AC
  Administered 2019-01-14: 1000 mL via INTRAVENOUS

## 2019-01-14 NOTE — ED Notes (Signed)
Patient given Sprite and Crackers as per her request.

## 2019-01-14 NOTE — ED Notes (Signed)
Patient refused to wait for discharge instructions and did not sign ED discharge. Patient had called an [facility] and ran out saying they were going to leave her if she did not get out there at that time.

## 2019-01-14 NOTE — ED Provider Notes (Signed)
Care assumed from [place] PA-C at shift change. Please see her note for full H&P.   Plan is for observation until clinically sober. Per previous provider pt ambulated to the bathroom and was unsteady on her feet.   Her work up is significant for UDS positive for cocaine, UA with large blood consistent with her current menses and positive nitrites. During my exam she admits to urinary frequency over last several days. She denies pelvic pain, vaginal discharge. CBC and CMP unremarkable. Pt resting comfortably.    Physical Exam  BP (!) 140/93   Pulse 76   Temp 97.8 F (36.6 C) (Oral)   Resp 18   SpO2 96%   Physical Exam  PE: Constitutional: well-developed, well-nourished, no apparent distress. Sleeping on stretcher. HENT: normocephalic, atraumatic.  Cardiovascular: normal rate and rhythm Pulmonary/Chest: effort normal Abdominal: non distended Musculoskeletal: full ROM, no edema Skin: warm and dry, no rash, no diaphoresis   ED Course/Procedures   Results for orders placed or performed during the hospital encounter of 01/14/19 (from the past 24 hour(s))  Rapid urine drug screen (hospital performed)     Status: Abnormal   Collection Time: 01/14/19  6:31 AM  Result Value Ref Range   Opiates NONE DETECTED NONE DETECTED   Cocaine POSITIVE (A) NONE DETECTED   Benzodiazepines NONE DETECTED NONE DETECTED   Amphetamines NONE DETECTED NONE DETECTED   Tetrahydrocannabinol NONE DETECTED NONE DETECTED   Barbiturates NONE DETECTED NONE DETECTED  Urinalysis, Routine w reflex microscopic     Status: Abnormal   Collection Time: 01/14/19  6:32 AM  Result Value Ref Range   Color, Urine YELLOW YELLOW   APPearance HAZY (A) CLEAR   Specific Gravity, Urine 1.013 1.005 - 1.030   pH 6.0 5.0 - 8.0   Glucose, UA NEGATIVE NEGATIVE mg/dL   Hgb urine dipstick LARGE (A) NEGATIVE   Bilirubin Urine NEGATIVE NEGATIVE   Ketones, ur NEGATIVE NEGATIVE mg/dL   Protein, ur NEGATIVE NEGATIVE mg/dL   Nitrite POSITIVE (A) NEGATIVE   Leukocytes,Ua NEGATIVE NEGATIVE   RBC / HPF 0-5 0 - 5 RBC/hpf   WBC, UA 6-10 0 - 5 WBC/hpf   Bacteria, UA FEW (A) NONE SEEN   Squamous Epithelial / LPF 0-5 0 - 5   Mucus PRESENT   CBC     Status: Abnormal   Collection Time: 01/14/19  6:43 AM  Result Value Ref Range   WBC 10.9 (H) 4.0 - 10.5 K/uL   RBC 4.20 3.87 - 5.11 MIL/uL   Hemoglobin 11.6 (L) 12.0 - 15.0 g/dL   HCT 37.2 36.0 - 46.0 %   MCV 88.6 80.0 - 100.0 fL   MCH 27.6 26.0 - 34.0 pg   MCHC 31.2 30.0 - 36.0 g/dL   RDW 14.7 11.5 - 15.5 %   Platelets 320 150 - 400 K/uL   nRBC 0.0 0.0 - 0.2 %  Comprehensive metabolic panel     Status: Abnormal   Collection Time: 01/14/19  6:43 AM  Result Value Ref Range   Sodium 138 135 - 145 mmol/L   Potassium 4.1 3.5 - 5.1 mmol/L   Chloride 105 98 - 111 mmol/L   CO2 23 22 - 32 mmol/L   Glucose, Bld 99 70 - 99 mg/dL   BUN 17 6 - 20 mg/dL   Creatinine, Ser 0.73 0.44 - 1.00 mg/dL   Calcium 9.3 8.9 - 10.3 mg/dL   Total Protein 8.5 (H) 6.5 - 8.1 g/dL   Albumin 4.2 3.5 -  5.0 g/dL   AST 21 15 - 41 U/L   ALT 15 0 - 44 U/L   Alkaline Phosphatase 73 38 - 126 U/L   Total Bilirubin 0.4 0.3 - 1.2 mg/dL   GFR calc non Af Amer >60 >60 mL/min   GFR calc Af Amer >60 >60 mL/min   Anion gap 10 5 - 15     MDM    Pt received in sign out. Work up is overall unremarkable. Pt was observed for total of 10 hours until she appeared to be clinically sober. She ambulates with steady gait, has goal directed thinking, and clear fluent speech.  On reassessment she has no abdominal tenderness, no signs of acute abdomen. No CVA tenderness. UA with signs of infection with  Nitrite positive, 6-10 WBC, few bacteria.She also has large hemoglobin in UA consistent with current menses. Urine culture ordered. She has an allergy to penicillin and keflex, will prescribe macrobid for possible UTI. Pt given out patient resources for substance abuse.   The patient appears reasonably screened  and/or stabilized for discharge and I doubt any other medical condition or other [facility] requiring further screening, evaluation, or treatment in the ED at this time prior to discharge. The patient is safe for discharge with strict return precautions discussed. Recommend pcp follow up if needed.    Portions of this note were generated with Lobbyist. Dictation errors may occur despite best attempts at proofreading.    Flint Melter 01/14/19 1207    Dorie Rank, MD 01/15/19 [phone]

## 2019-01-14 NOTE — ED Notes (Signed)
Patient ambulated to the bathroom without difficulty.

## 2019-01-14 NOTE — Discharge Instructions (Addendum)
You have been seen today for dizzyness and intoxication. Please read and follow all provided instructions. Return to the emergency room for worsening condition or new concerning symptoms.    1. Medications:  Continue usual home medications  -Prescription sent to the pharmacy for an antibiotic for your urinary tract infection. Please take as prescribed. Take medications as prescribed. Please review all of the medicines and only take them if you do not have an allergy to them.   2. Treatment: rest, drink plenty of fluids  3. Follow Up: Please follow up with your primary doctor in 2-5 days for discussion of your diagnoses and further evaluation after today's visit; Call today to arrange your follow up.  If you do not have a primary care doctor use the resource guide provided to find one;   Our doctors and staff appreciate your choosing [place] for your emergency medical care needs. We are here to serve you.  RESOURCE GUIDE  Chronic Pain Problems: Contact [facility]  [phone] Patients need to be referred by their primary care doctor.  Insufficient Money for Medicine: Contact [employer]:  call "211" or [place] [phone].  No Primary Care Doctor: Call [facility]  [phone] - can help you locate a primary care doctor that  accepts your insurance, provides certain services, etc. Physician Referral Service- [phone]  Agencies that provide inexpensive medical care: [facility]  [facility]  [phone] [facility]  [phone] [facility]  [phone] Planned [facility]  [phone] [facility]  [phone]  [place] Providers: [facility]- [address], [address]  [phone], Mon-Fri 9am-7pm, Sat 9am-1pm [place], [address], Suite  [place]  [place]- [address]  [place], [address], [phone]  Only accepts [place] patients after they have their name  applied to their card  Self Pay (no insurance) in [facility]: Sickle Cell Patients: Dr Kevan Ny, [facility]  [place], [facility]- [place]  [place] [facility]- [phone] [place], [facility]- see information above (Speak to [employer] if you do not have insurance)       -  [facility]- [place], [place],  [place], [place]  Dr Vista Lawman-  [address], [place], [place] [facility]- [address] [phone]       -  [facility]- [address], [place], also [address] [phone]       -    [facility]- [address], [place], 1st & 3rd Saturday   every month, 10am-1pm  1) Find a Doctor and Pay Out of Pocket Although you won't have to find out who is covered by your insurance plan, it is a good idea  to ask around and get recommendations. You will then need to call the office and see if the doctor you have chosen will accept you as a new patient and what types of options they offer for patients who are self-pay. Some doctors offer discounts or will set up payment plans for their patients who do not have insurance, but you will need to ask so you aren't surprised when you get to your appointment.  2) Contact Your Local Health Department Not all health departments have doctors that can see patients for sick visits, but many do, so it is worth a call to see if yours does. If you don't know where your local health department is, you can check in your  phone book. The CDC also has a tool to help you locate your state's health department, and many state websites also have listings of all of their local health departments.  3) Find a [facility] If your illness is not likely to be very severe or complicated, you may want to try a walk in clinic. These are popping up all over the country in pharmacies, drugstores, and shopping centers. They're usually staffed by nurse practitioners or physician assistants that have been trained to treat common illnesses and complaints. They're usually fairly quick and inexpensive. However, if you have serious medical issues or chronic medical problems, these are probably not your best option  [facility] [place], [facility], [address] [place], phone [phone] or [phone].  Monday - Friday, call for an appointment. [place], [facility], [address], [place], phone [phone] or [phone].  Monday - Friday, call for an appointment.  Abuse/Neglect: [place] [phone] [place] [phone] (After Hours)  Emergency Shelter:  [facility] [phone]  Maternity Homes: [facility] [place] [phone] [place] [phone]  MRSA Hotline #:   586-009-6881  [facility] [place] [facility]. [address]         [place] Phone:  [phone]                                  Phone:  [phone]                   Phone:  [place], [place], [address],  [phone], [place] [phone] or [phone] (After Hours)   [place] Substance Abuse Resources: Alcohol and Drug Services  [place] [phone] [facility] [place] P[place] [phone] Residential & Outpatient Substance Abuse Program  [phone]  Psychological Services: [place]  [phone] [place]  [place], [place] [address] [place], [place]: [phone] or [phone], [employer]  Dental Assistance  If unable to pay or uninsured, contact:  [facility] or [facility]. to become qualified for the adult dental clinic.  Patients with Medicaid: [facility] [phone] [address] [place], Center [address], [phone]  If unable to pay, or uninsured, contact [facility] [phone]) or [place] [phone] in [place], [place] in [facility]) to become qualified for the adult dental clinic   Other [place]- [place], [place], [place], [address], [place], [place], 2nd and 4th Thursday of the month at 6:30am.  10 clients each day by appointment, can sometimes see walk-in patients if someone does not show for an appointment. [facility]- [address] Hillard Danker [place], [place], [address], [place], [place], [place], [address], [place] [facility] [phone]  LABORATORY TESTS:         If you had any labs drawn in the ED that have not resulted by the time you are discharged home, we will review these lab results  and the treatment given to you.  If there is any further treatment or notification needed, we will contact you by phone, or letter.  "PLEASE ENSURE THAT YOU HAVE GIVEN [place] YOUR CURRENT WORKING PHONE NUMBER AND YOUR CURRENT ADDRESS, so that we can contact you if needed."  RADIOLOGY TESTS:  If the referred physician wants todays x-rays, please call the hospitals Radiology Department the day before your doctors appointment. [facility]     [phone] [place] Sidle   [place]     [phone]

## 2019-01-14 NOTE — ED Triage Notes (Signed)
Pt presents by [facility] for vomiting and using crack, marijuana, and alcohol. 20ga Left hand 4mg  Zofan IVP prior to arrival. EMS reports Fire dept gave 1mg  Narcan via nare and no respiratory depression was reported prior to administration per EMS.
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
## 2019-01-16 LAB — URINE CULTURE: Culture: >=100000 — AB

## 2019-01-17 ENCOUNTER — Telehealth: Payer: Self-pay | Admitting: *Deleted

## 2019-01-17 NOTE — Telephone Encounter (Signed)
Post ED Visit - Positive Culture Follow-up  Culture report reviewed by antimicrobial stewardship pharmacist: [place] Team []  Elenor Quinones, Pharm.D. []  Heide Guile, Pharm.D., BCPS AQ-ID []  Parks Neptune, Pharm.D., BCPS []  Alycia Rossetti, Pharm.D., BCPS []  [place], Pharm.D., BCPS, AAHIVP []  Legrand Como, Pharm.D., BCPS, AAHIVP []  Salome Arnt, PharmD, BCPS []  Johnnette Gourd, PharmD, BCPS []  Hughes Better, PharmD, BCPS []  Leeroy Cha, PharmD []  Laqueta Linden, PharmD, BCPS []  Albertina Parr, PharmD  [place] Team [x]  Leodis Sias, PharmD []  Lindell Spar, PharmD []  Royetta Asal, PharmD []  Graylin Shiver, Rph []  Rema Fendt) Glennon Mac, PharmD []  Arlyn Dunning, PharmD []  Netta Cedars, PharmD []  Dia Sitter, PharmD []  Leone Haven, PharmD []  Gretta Arab, PharmD []  Theodis Shove, PharmD []  Peggyann Juba, PharmD []  Reuel Boom, PharmD   Positive urine culture Treated with Nitrofurantoin Monohyd Macro, organism sensitive to the same and no further patient follow-up is required at this time.  Harlon Flor [facility] 01/17/2019, 12:36 PM

## 2019-02-09 ENCOUNTER — Other Ambulatory Visit: Payer: Self-pay

## 2019-02-09 ENCOUNTER — Encounter (HOSPITAL_COMMUNITY): Payer: Self-pay | Admitting: *Deleted

## 2019-02-09 ENCOUNTER — Observation Stay (HOSPITAL_COMMUNITY)
Admission: EM | Admit: 2019-02-09 | Discharge: 2019-02-11 | Disposition: A | Discharge status: Home / Self Care | Payer: Medicaid Other | Attending: Internal Medicine | Admitting: Internal Medicine

## 2019-02-09 DIAGNOSIS — F1910 Other psychoactive substance abuse, uncomplicated: Secondary | ICD-10-CM | POA: Diagnosis not present

## 2019-02-09 DIAGNOSIS — F209 Schizophrenia, unspecified: Secondary | ICD-10-CM | POA: Diagnosis present

## 2019-02-09 DIAGNOSIS — R531 Weakness: Principal | ICD-10-CM | POA: Insufficient documentation

## 2019-02-09 DIAGNOSIS — D509 Iron deficiency anemia, unspecified: Secondary | ICD-10-CM | POA: Insufficient documentation

## 2019-02-09 DIAGNOSIS — Z590 Homelessness: Secondary | ICD-10-CM | POA: Diagnosis not present

## 2019-02-09 DIAGNOSIS — R569 Unspecified convulsions: Secondary | ICD-10-CM | POA: Insufficient documentation

## 2019-02-09 DIAGNOSIS — F17210 Nicotine dependence, cigarettes, uncomplicated: Secondary | ICD-10-CM | POA: Insufficient documentation

## 2019-02-09 DIAGNOSIS — R519 Headache, unspecified: Secondary | ICD-10-CM | POA: Diagnosis not present

## 2019-02-09 DIAGNOSIS — F200 Paranoid schizophrenia: Secondary | ICD-10-CM | POA: Insufficient documentation

## 2019-02-09 DIAGNOSIS — I639 Cerebral infarction, unspecified: Secondary | ICD-10-CM

## 2019-02-09 DIAGNOSIS — D649 Anemia, unspecified: Secondary | ICD-10-CM

## 2019-02-09 DIAGNOSIS — F203 Undifferentiated schizophrenia: Secondary | ICD-10-CM | POA: Diagnosis present

## 2019-02-09 DIAGNOSIS — Z79899 Other long term (current) drug therapy: Secondary | ICD-10-CM | POA: Insufficient documentation

## 2019-02-09 DIAGNOSIS — Z20828 Contact with and (suspected) exposure to other viral communicable diseases: Secondary | ICD-10-CM | POA: Insufficient documentation

## 2019-02-09 DIAGNOSIS — Z87898 Personal history of other specified conditions: Secondary | ICD-10-CM

## 2019-02-09 NOTE — ED Triage Notes (Signed)
Pt reports L arm heaviness intermittently since 3 pm. Per EMS, pt reported hx of CVA 5 years ago with no deficits. Weaker grip and drift to L arm noted. Pt also reports L breast pain that is warm to the touch. No redness or warmth noted. Pt also reports L temporal headache and L sided neck pain

## 2019-02-09 NOTE — ED Provider Notes (Signed)
[facility] EMERGENCY DEPARTMENT Provider Note   CSN: [id number] Arrival date & time: 02/09/19  2302    History   Chief Complaint Chief Complaint  Patient presents with   Stroke Symptoms    HPI Joanne Gonzalez is a 53 y.o. female.   The history is provided by the patient.  She has history of schizophrenia, seizures, stroke and states that she has been having heaviness of her left arm and leg and numbness in the left arm and leg since about symptoms of leg swelling.  She is also complaining of pain in the left temporal area and left.  Symptoms are similar to what she had with a stroke several years ago but she had no residual deficit from that stroke.  She is also complaining of pain in her left breast.  She denies fever or chills.  She denies alcohol or drug use.  She does not feel she is having any difficulty with speech.  A second complaint is pain in her left breast which has been present for about a week.  She states that it feels feverish but she is not sure if there are any nodules.  There has been no discharge.  She rates the pain a 10/10.  Past Medical History:  Diagnosis Date   CVA (cerebral infarction)    Schizophrenia ([place])    Seizures ([place])     Patient Active Problem List   Diagnosis Date Noted   Homelessness 11/26/2018   Adjustment disorder with depressed mood 11/26/2018   Substance abuse ([place])    Iron deficiency anemia 09/01/2014   Cocaine use 09/01/2014   Hyperglycemia 09/01/2014   Schizophrenia, paranoid ([place]) 08/31/2014   Left-sided weakness 08/30/2014   Hemiplegia, unspecified, affecting nondominant side 04/13/2013    Past Surgical History:  Procedure Laterality Date   LIMB SPARING RESECTION HIP W/ SADDLE JOINT REPLACEMENT       OB History   No obstetric history on file.      Home Medications    Prior to Admission medications   Medication Sig Start Date End Date Taking? Authorizing Provider  nitrofurantoin,  macrocrystal-monohydrate, (MACROBID) 100 MG capsule Take 1 capsule (100 mg total) by mouth 2 (two) times daily. 01/14/19   Albrizze, Kaitlyn E, PA-C  QUEtiapine (SEROQUEL) 100 MG tablet Take 1 tablet (100 mg total) by mouth at bedtime. 11/26/18   Deno Etienne, DO  traZODone (DESYREL) 100 MG tablet Take 1 tablet (100 mg total) by mouth daily. 11/26/18   Deno Etienne, DO  ferrous sulfate 325 (65 FE) MG tablet Take 1 tablet (325 mg total) by mouth daily with breakfast. Patient not taking: Reported on 03/14/2015 09/02/14 03/14/15  Luan Moore, MD  sertraline (ZOLOFT) 50 MG tablet Take 1 tablet (50 mg total) by mouth daily. Patient not taking: Reported on 03/14/2015 09/02/14 03/14/15  Luan Moore, MD    Family History Family History  Problem Relation Age of Onset   Seizures Mother    Hypertension Mother     Social History Social History   Tobacco Use   Smoking status: Current Every Day Smoker    Packs/day: 0.00    Types: Cigarettes   Smokeless tobacco: Never Used  Substance Use Topics   Alcohol use: No   Drug use: Yes    Types: Cocaine, Marijuana     Allergies   Keflex [cephalexin], Penicillins, and Lactose intolerance (gi)   Review of Systems Review of Systems  All other systems reviewed and are negative.  Physical Exam Updated Vital Signs BP (!) 150/89    Pulse 79    Temp 98.6 F (37 C)    Resp 20    LMP 02/09/2019    SpO2 98%   Physical Exam Vitals signs and nursing note reviewed. Exam conducted with a chaperone present.    53 year old female, resting comfortably and in no acute distress. Vital signs are significant for elevated blood pressure. Oxygen saturation is 98%, which is normal. Head is normocephalic and atraumatic. PERRLA, EOMI. Oropharynx is clear. Neck is nontender and supple without adenopathy or JVD.  There are no carotid bruits. Back is nontender and there is no CVA tenderness. Lungs are clear without rales, wheezes, or rhonchi. Chest is  nontender. Breast exam is normal - no warmth, masses, axillary adenopathy. Heart has regular rate and rhythm without murmur. Abdomen is soft, flat, nontender without masses or hepatosplenomegaly and peristalsis is normoactive. Extremities have no cyanosis or edema, full range of motion is present. Skin is warm and dry without rash. Neurologic: Awake and alert, speech seems mildly dysarthric.  There is questionable left central facial droop.  Tongue protrudes toward the right.  There is mild weakness of the left arm and left leg with strength 4/5.  There is left pronator drift.  There is decreased sensation in the left leg and left side of the face but normal sensation of the left arm.  There is no extinction on double simultaneous stimulation.  Naming and speech repetition are normal.  ED Treatments / Results  Labs (all labs ordered are listed, but only abnormal results are displayed) Labs Reviewed  APTT - Abnormal; Notable for the following components:      Result Value   aPTT 21 (*)    All other components within normal limits  CBC - Abnormal; Notable for the following components:   RBC 3.75 (*)    Hemoglobin 10.3 (*)    HCT 33.2 (*)    All other components within normal limits  COMPREHENSIVE METABOLIC PANEL - Abnormal; Notable for the following components:   CO2 20 (*)    Calcium 8.5 (*)    All other components within normal limits  SARS CORONAVIRUS 2 (TAT 6-24 HRS)  ETHANOL  PROTIME-INR  DIFFERENTIAL  RAPID URINE DRUG SCREEN, HOSP PERFORMED  URINALYSIS, ROUTINE W REFLEX MICROSCOPIC  HIV ANTIBODY (ROUTINE TESTING W REFLEX)  HEMOGLOBIN A1C  LIPID PANEL  I-STAT BETA HCG BLOOD, ED ([facility], [facility], [facility] ONLY)    EKG EKG Interpretation  Date/Time:  Friday February 09 2019 23:07:29 EST Ventricular Rate:  76 PR Interval:    QRS Duration: 76 QT Interval:  404 QTC Calculation: 455 R Axis:   45 Text Interpretation: Sinus rhythm Abnormal R-wave progression, early transition When compared  with ECG of 11/25/2018, No significant change was found Confirmed by Delora Fuel ([id number]) on 02/09/2019 11:20:16 PM   Radiology Ct Angio Head W Or Wo Contrast  Result Date: 02/10/2019 CLINICAL DATA:  Left arm weakness with head and neck pain EXAM: CT ANGIOGRAPHY HEAD AND NECK TECHNIQUE: Multidetector CT imaging of the head and neck was performed using the standard protocol during bolus administration of intravenous contrast. Multiplanar CT image reconstructions and MIPs were obtained to evaluate the vascular anatomy. Carotid stenosis measurements (when applicable) are obtained utilizing NASCET criteria, using the distal internal carotid diameter as the denominator. CONTRAST:  50mL OMNIPAQUE IOHEXOL 350 MG/ML SOLN COMPARISON:  Brain MRI 04/13/2013 FINDINGS: CT HEAD FINDINGS Brain: There is no mass, hemorrhage or  extra-axial collection. The size and configuration of the ventricles and extra-axial CSF spaces are normal. There is multifocal hypoattenuation within the right hemisphere, including a area of loss of gray-white differentiation in the anterior right frontal lobe. Skull: The visualized skull base, calvarium and extracranial soft tissues are normal. Sinuses/Orbits: No fluid levels or advanced mucosal thickening of the visualized paranasal sinuses. No mastoid or middle ear effusion. The orbits are normal. CTA NECK FINDINGS SKELETON: There is no bony spinal canal stenosis. No lytic or blastic lesion. OTHER NECK: Normal pharynx, larynx and major salivary glands. No cervical lymphadenopathy. Unremarkable thyroid gland. UPPER CHEST: Mild pulmonary edema AORTIC ARCH: There is no calcific atherosclerosis of the aortic arch. There is no aneurysm, dissection or hemodynamically significant stenosis of the visualized portion of the aorta. Normal variant aortic arch branching pattern with the left vertebral artery arising independently from the aortic arch. The visualized proximal subclavian arteries are widely  patent. RIGHT CAROTID SYSTEM: Normal without aneurysm, dissection or stenosis. LEFT CAROTID SYSTEM: Normal without aneurysm, dissection or stenosis. VERTEBRAL ARTERIES: Left dominant configuration. Both origins are clearly patent. There is no dissection, occlusion or flow-limiting stenosis to the skull base (V1-V3 segments). CTA HEAD FINDINGS POSTERIOR CIRCULATION: --Vertebral arteries: Normal V4 segments. --Posterior inferior cerebellar arteries (PICA): Patent origins from the vertebral arteries. --Anterior inferior cerebellar arteries (AICA): Patent origins from the basilar artery. --Basilar artery: Normal. --Superior cerebellar arteries: Normal. --Posterior cerebral arteries: Normal. Both originate from the basilar artery. Posterior communicating arteries (p-comm) are diminutive or absent. ANTERIOR CIRCULATION: --Intracranial internal carotid arteries: Normal. --Anterior cerebral arteries (ACA): Normal. Both A1 segments are present. Patent anterior communicating artery (a-comm). --Middle cerebral arteries (MCA): Normal. VENOUS SINUSES: As permitted by contrast timing, patent. ANATOMIC VARIANTS: None Review of the MIP images confirms the above findings. IMPRESSION: 1. No intracranial arterial occlusion or high-grade stenosis. 2. Multifocal hypoattenuation within the right hemisphere, including a area of loss of gray-white differentiation in the anterior right frontal lobe that may indicate an acute to subacute infarct. MRI recommended for further evaluation. 3. No cervical carotid or vertebral artery stenosis. 4. Mild pulmonary edema. Electronically Signed   By: Ulyses Jarred M.D.   On: 02/10/2019 03:35   Ct Angio Neck W And/or Wo Contrast  Result Date: 02/10/2019 CLINICAL DATA:  Left arm weakness with head and neck pain EXAM: CT ANGIOGRAPHY HEAD AND NECK TECHNIQUE: Multidetector CT imaging of the head and neck was performed using the standard protocol during bolus administration of intravenous contrast.  Multiplanar CT image reconstructions and MIPs were obtained to evaluate the vascular anatomy. Carotid stenosis measurements (when applicable) are obtained utilizing NASCET criteria, using the distal internal carotid diameter as the denominator. CONTRAST:  30mL OMNIPAQUE IOHEXOL 350 MG/ML SOLN COMPARISON:  Brain MRI 04/13/2013 FINDINGS: CT HEAD FINDINGS Brain: There is no mass, hemorrhage or extra-axial collection. The size and configuration of the ventricles and extra-axial CSF spaces are normal. There is multifocal hypoattenuation within the right hemisphere, including a area of loss of gray-white differentiation in the anterior right frontal lobe. Skull: The visualized skull base, calvarium and extracranial soft tissues are normal. Sinuses/Orbits: No fluid levels or advanced mucosal thickening of the visualized paranasal sinuses. No mastoid or middle ear effusion. The orbits are normal. CTA NECK FINDINGS SKELETON: There is no bony spinal canal stenosis. No lytic or blastic lesion. OTHER NECK: Normal pharynx, larynx and major salivary glands. No cervical lymphadenopathy. Unremarkable thyroid gland. UPPER CHEST: Mild pulmonary edema AORTIC ARCH: There is no calcific atherosclerosis of  the aortic arch. There is no aneurysm, dissection or hemodynamically significant stenosis of the visualized portion of the aorta. Normal variant aortic arch branching pattern with the left vertebral artery arising independently from the aortic arch. The visualized proximal subclavian arteries are widely patent. RIGHT CAROTID SYSTEM: Normal without aneurysm, dissection or stenosis. LEFT CAROTID SYSTEM: Normal without aneurysm, dissection or stenosis. VERTEBRAL ARTERIES: Left dominant configuration. Both origins are clearly patent. There is no dissection, occlusion or flow-limiting stenosis to the skull base (V1-V3 segments). CTA HEAD FINDINGS POSTERIOR CIRCULATION: --Vertebral arteries: Normal V4 segments. --Posterior inferior  cerebellar arteries (PICA): Patent origins from the vertebral arteries. --Anterior inferior cerebellar arteries (AICA): Patent origins from the basilar artery. --Basilar artery: Normal. --Superior cerebellar arteries: Normal. --Posterior cerebral arteries: Normal. Both originate from the basilar artery. Posterior communicating arteries (p-comm) are diminutive or absent. ANTERIOR CIRCULATION: --Intracranial internal carotid arteries: Normal. --Anterior cerebral arteries (ACA): Normal. Both A1 segments are present. Patent anterior communicating artery (a-comm). --Middle cerebral arteries (MCA): Normal. VENOUS SINUSES: As permitted by contrast timing, patent. ANATOMIC VARIANTS: None Review of the MIP images confirms the above findings. IMPRESSION: 1. No intracranial arterial occlusion or high-grade stenosis. 2. Multifocal hypoattenuation within the right hemisphere, including a area of loss of gray-white differentiation in the anterior right frontal lobe that may indicate an acute to subacute infarct. MRI recommended for further evaluation. 3. No cervical carotid or vertebral artery stenosis. 4. Mild pulmonary edema. Electronically Signed   By: Ulyses Jarred M.D.   On: 02/10/2019 03:35    Procedures Procedures  CRITICAL CARE Performed by: Delora Fuel Total critical care time: 65 minutes Critical care time was exclusive of separately billable procedures and treating other patients. Critical care was necessary to treat or prevent imminent or life-threatening deterioration. Critical care was time spent personally by me on the following activities: development of treatment plan with patient and/or surrogate as well as nursing, discussions with consultants, evaluation of patient's response to treatment, examination of patient, obtaining history from patient or surrogate, ordering and performing treatments and interventions, ordering and review of laboratory studies, ordering and review of radiographic studies,  pulse oximetry and re-evaluation of patient's condition.  Medications Ordered in ED Medications  LORazepam (ATIVAN) injection 1 mg (1 mg Intravenous Not Given 02/10/19 0635)  QUEtiapine (SEROQUEL) tablet 100 mg (has no administration in time range)  traZODone (DESYREL) tablet 100 mg (has no administration in time range)   stroke: mapping our early stages of recovery book (has no administration in time range)  0.9 %  sodium chloride infusion (has no administration in time range)  acetaminophen (TYLENOL) tablet 650 mg (has no administration in time range)    Or  acetaminophen (TYLENOL) 160 MG/5ML solution 650 mg (has no administration in time range)    Or  acetaminophen (TYLENOL) suppository 650 mg (has no administration in time range)  senna-docusate (Senokot-S) tablet 1 tablet (has no administration in time range)  enoxaparin (LOVENOX) injection 40 mg (has no administration in time range)  calcium gluconate 1 g/ 50 mL sodium chloride IVPB (has no administration in time range)  aspirin suppository 300 mg (has no administration in time range)    Or  aspirin tablet 325 mg (has no administration in time range)  sodium chloride 0.9 % bolus 1,000 mL (0 mLs Intravenous Stopped 02/10/19 0540)  metoCLOPramide (REGLAN) injection 10 mg (10 mg Intravenous Given 02/10/19 0155)  diphenhydrAMINE (BENADRYL) injection 25 mg (25 mg Intravenous Given 02/10/19 0148)  ketorolac (TORADOL) 30 MG/ML injection 30  mg (30 mg Intravenous Given 02/10/19 0209)  iohexol (OMNIPAQUE) 350 MG/ML injection 75 mL (75 mLs Intravenous Contrast Given 02/10/19 0257)     Initial Impression / Assessment and Plan / ED Course  I have reviewed the triage vital signs and the nursing notes.  Pertinent labs & imaging results that were available during my care of the patient were reviewed by me and considered in my medical decision making (see chart for details).  Left-sided weakness and decreased sensation which could be consistent  with stroke.  However, tongue deviates to the wrong side for a stroke involving the left side of the face and left arm and leg.  I suspect conversion disorder.  Old records are reviewed, and she was evaluated at [name] for similar symptoms and October 2017 with negative work-up and suggestion of conversion disorder.  She is outside the window for thrombolytic therapy, will not activate code stroke but will send for CT of head and CT angiogram of neck and head.  CT scans low-attenuation areas which could represent stroke, no large vessel occlusion. MRI was ordered, but patient was refusing MRI and was refusing sedation to get MRI. Consultation was obtained with Dr. Leonel Ramsay of neurology service who also felt that symptoms were likely from conversion disorder but could not be 100% sure it was not a stroke and recommended admission for remainder of stroke work-up. Case is discussed with Dr. Tamala Julian of [facility], who agrees to admit the patient. Of note, labs showed mild anemia, ECG showed no acute changes.  Final Clinical Impressions(s) / ED Diagnoses   Final diagnoses:  Cerebrovascular accident (CVA), unspecified mechanism ([place])  Normochromic normocytic anemia    ED Discharge Orders    None       Delora Fuel, MD [id number] [phone]

## 2019-02-10 ENCOUNTER — Observation Stay (HOSPITAL_COMMUNITY): Payer: Medicaid Other

## 2019-02-10 ENCOUNTER — Emergency Department (HOSPITAL_COMMUNITY): Payer: Medicaid Other

## 2019-02-10 ENCOUNTER — Observation Stay (HOSPITAL_BASED_OUTPATIENT_CLINIC_OR_DEPARTMENT_OTHER): Payer: Medicaid Other

## 2019-02-10 DIAGNOSIS — R531 Weakness: Secondary | ICD-10-CM

## 2019-02-10 DIAGNOSIS — I6389 Other cerebral infarction: Secondary | ICD-10-CM

## 2019-02-10 DIAGNOSIS — Z87898 Personal history of other specified conditions: Secondary | ICD-10-CM

## 2019-02-10 DIAGNOSIS — F1910 Other psychoactive substance abuse, uncomplicated: Secondary | ICD-10-CM

## 2019-02-10 DIAGNOSIS — I639 Cerebral infarction, unspecified: Secondary | ICD-10-CM | POA: Diagnosis not present

## 2019-02-10 DIAGNOSIS — F200 Paranoid schizophrenia: Secondary | ICD-10-CM

## 2019-02-10 LAB — COMPREHENSIVE METABOLIC PANEL
ALT: 18 U/L (ref 0–44)
AST: 27 U/L (ref 15–41)
Albumin: 3.6 g/dL (ref 3.5–5.0)
Alkaline Phosphatase: 85 U/L (ref 38–126)
Anion gap: 9 (ref 5–15)
BUN: 14 mg/dL (ref 6–20)
CO2: 20 mmol/L — ABNORMAL LOW (ref 22–32)
Calcium: 8.5 mg/dL — ABNORMAL LOW (ref 8.9–10.3)
Chloride: 107 mmol/L (ref 98–111)
Creatinine, Ser: 0.72 mg/dL (ref 0.44–1.00)
GFR calc Af Amer: >60 mL/min (ref >60)
GFR calc non Af Amer: >60 mL/min (ref >60)
Glucose, Bld: 91 mg/dL (ref 70–99)
Potassium: 4 mmol/L (ref 3.5–5.1)
Sodium: 136 mmol/L (ref 135–145)
Total Bilirubin: 0.6 mg/dL (ref 0.3–1.2)
Total Protein: 7.2 g/dL (ref 6.5–8.1)

## 2019-02-10 LAB — DIFFERENTIAL
Abs Immature Granulocytes: 0.05 10*3/uL (ref 0.00–0.07)
Basophils Absolute: 0.1 10*3/uL (ref 0.0–0.1)
Basophils Relative: 1 %
Eosinophils Absolute: 0.5 10*3/uL (ref 0.0–0.5)
Eosinophils Relative: 6 %
Immature Granulocytes: 1 %
Lymphocytes Relative: 30 %
Lymphs Abs: 2.4 10*3/uL (ref 0.7–4.0)
Monocytes Absolute: 0.8 10*3/uL (ref 0.1–1.0)
Monocytes Relative: 9 %
Neutro Abs: 4.3 10*3/uL (ref 1.7–7.7)
Neutrophils Relative %: 53 %

## 2019-02-10 LAB — LIPID PANEL
Cholesterol: 161 mg/dL (ref 0–200)
HDL: 60 mg/dL (ref >40)
LDL Cholesterol: 92 mg/dL (ref 0–99)
Total CHOL/HDL Ratio: 2.7 RATIO
Triglycerides: 43 mg/dL (ref <150)
VLDL: 9 mg/dL (ref 0–40)

## 2019-02-10 LAB — HIV ANTIBODY (ROUTINE TESTING W REFLEX): HIV Screen 4th Generation wRfx: NONREACTIVE

## 2019-02-10 LAB — CBC
HCT: 33.2 % — ABNORMAL LOW (ref 36.0–46.0)
Hemoglobin: 10.3 g/dL — ABNORMAL LOW (ref 12.0–15.0)
MCH: 27.5 pg (ref 26.0–34.0)
MCHC: 31 g/dL (ref 30.0–36.0)
MCV: 88.5 fL (ref 80.0–100.0)
Platelets: UNDETERMINED 10*3/uL (ref 150–400)
RBC: 3.75 MIL/uL — ABNORMAL LOW (ref 3.87–5.11)
RDW: 15.2 % (ref 11.5–15.5)
WBC: 8 10*3/uL (ref 4.0–10.5)
nRBC: 0 % (ref 0.0–0.2)

## 2019-02-10 LAB — ECHOCARDIOGRAM COMPLETE
Height: 65 in
Weight: 2310.42 oz

## 2019-02-10 LAB — HEMOGLOBIN A1C
Hgb A1c MFr Bld: 5.6 % (ref 4.8–5.6)
Mean Plasma Glucose: 114.02 mg/dL

## 2019-02-10 LAB — SARS CORONAVIRUS 2 (TAT 6-24 HRS): SARS Coronavirus 2: NEGATIVE

## 2019-02-10 LAB — I-STAT BETA HCG BLOOD, ED (MC, WL, AP ONLY): I-stat hCG, quantitative: <5 m[IU]/mL (ref <5)

## 2019-02-10 LAB — APTT: aPTT: 21 seconds — ABNORMAL LOW (ref 24–36)

## 2019-02-10 LAB — PROTIME-INR
INR: 1 (ref 0.8–1.2)
Prothrombin Time: 13.2 seconds (ref 11.4–15.2)

## 2019-02-10 LAB — ETHANOL: Alcohol, Ethyl (B): <10 mg/dL (ref <10)

## 2019-02-10 MED ORDER — KETOROLAC TROMETHAMINE 30 MG/ML IJ SOLN
30.0000 mg | Freq: Once | INTRAMUSCULAR | Status: AC
  Administered 2019-02-10: 30 mg via INTRAVENOUS
  Filled 2019-02-10: qty 1

## 2019-02-10 MED ORDER — ENOXAPARIN SODIUM 40 MG/0.4ML ~~LOC~~ SOLN
40.0000 mg | SUBCUTANEOUS | Status: DC
  Administered 2019-02-10 – 2019-02-11 (×2): 40 mg via SUBCUTANEOUS
  Filled 2019-02-10 (×2): qty 0.4

## 2019-02-10 MED ORDER — SODIUM CHLORIDE 0.9 % IV BOLUS
1000.0000 mL | Freq: Once | INTRAVENOUS | Status: AC
  Administered 2019-02-10: 1000 mL via INTRAVENOUS

## 2019-02-10 MED ORDER — CALCIUM GLUCONATE-NACL 1-0.675 GM/50ML-% IV SOLN
1.0000 g | Freq: Once | INTRAVENOUS | Status: AC
  Administered 2019-02-10: 1000 mg via INTRAVENOUS
  Filled 2019-02-10: qty 50

## 2019-02-10 MED ORDER — ASPIRIN 325 MG PO TABS
325.0000 mg | ORAL_TABLET | Freq: Every day | ORAL | Status: DC
  Administered 2019-02-10 – 2019-02-11 (×2): 325 mg via ORAL
  Filled 2019-02-10 (×2): qty 1

## 2019-02-10 MED ORDER — LORAZEPAM 2 MG/ML IJ SOLN
1.0000 mg | Freq: Once | INTRAMUSCULAR | Status: DC
  Filled 2019-02-10: qty 1

## 2019-02-10 MED ORDER — ACETAMINOPHEN 160 MG/5ML PO SOLN: 650.0000 mg | ORAL | Status: DC | PRN

## 2019-02-10 MED ORDER — SODIUM CHLORIDE 0.9 % IV SOLN
Freq: Once | INTRAVENOUS | Status: AC
  Administered 2019-02-10: 12:00:00 via INTRAVENOUS

## 2019-02-10 MED ORDER — TRAZODONE HCL 100 MG PO TABS
100.0000 mg | ORAL_TABLET | Freq: Every day | ORAL | Status: DC
  Administered 2019-02-11: 100 mg via ORAL
  Filled 2019-02-10: qty 1

## 2019-02-10 MED ORDER — ACETAMINOPHEN 650 MG RE SUPP: 650.0000 mg | RECTAL | Status: DC | PRN

## 2019-02-10 MED ORDER — QUETIAPINE FUMARATE 50 MG PO TABS
100.0000 mg | ORAL_TABLET | Freq: Every day | ORAL | Status: DC
  Administered 2019-02-10: 100 mg via ORAL
  Filled 2019-02-10: qty 2

## 2019-02-10 MED ORDER — DIPHENHYDRAMINE HCL 50 MG/ML IJ SOLN
25.0000 mg | Freq: Once | INTRAMUSCULAR | Status: AC
  Administered 2019-02-10: 25 mg via INTRAVENOUS
  Filled 2019-02-10: qty 1

## 2019-02-10 MED ORDER — SENNOSIDES-DOCUSATE SODIUM 8.6-50 MG PO TABS: 1.0000 | ORAL_TABLET | Freq: Every evening | ORAL | Status: DC | PRN

## 2019-02-10 MED ORDER — STROKE: EARLY STAGES OF RECOVERY BOOK
Freq: Once | Status: AC
  Administered 2019-02-10: 10:00:00
  Filled 2019-02-10: qty 1

## 2019-02-10 MED ORDER — METOCLOPRAMIDE HCL 5 MG/ML IJ SOLN
10.0000 mg | Freq: Once | INTRAMUSCULAR | Status: AC
  Administered 2019-02-10: 10 mg via INTRAVENOUS
  Filled 2019-02-10: qty 2

## 2019-02-10 MED ORDER — NICOTINE 21 MG/24HR TD PT24: 21.0000 mg | MEDICATED_PATCH | Freq: Every day | TRANSDERMAL | Status: DC | PRN

## 2019-02-10 MED ORDER — LORAZEPAM 2 MG/ML IJ SOLN
1.0000 mg | Freq: Once | INTRAMUSCULAR | Status: AC
  Administered 2019-02-11: 1 mg via INTRAVENOUS
  Filled 2019-02-10: qty 1

## 2019-02-10 MED ORDER — IOHEXOL 350 MG/ML SOLN
75.0000 mL | Freq: Once | INTRAVENOUS | Status: AC | PRN
  Administered 2019-02-10: 75 mL via INTRAVENOUS

## 2019-02-10 MED ORDER — ASPIRIN 300 MG RE SUPP: 300.0000 mg | Freq: Every day | RECTAL | Status: DC

## 2019-02-10 MED ORDER — ACETAMINOPHEN 325 MG PO TABS
650.0000 mg | ORAL_TABLET | ORAL | Status: DC | PRN
  Administered 2019-02-10: 650 mg via ORAL
  Filled 2019-02-10: qty 2

## 2019-02-10 NOTE — ED Notes (Signed)
The pt report that she cannot use her lt arm and leg as much  From an old stroke

## 2019-02-10 NOTE — ED Notes (Signed)
Dr Leonel Ramsay at   The bedside

## 2019-02-10 NOTE — ED Notes (Signed)
Pt c/o a headache to the lt side of her head
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
## 2019-02-10 NOTE — Progress Notes (Signed)
  Echocardiogram 2D Echocardiogram has been performed.  Joanne Gonzalez 02/10/2019, 11:22 AM

## 2019-02-10 NOTE — Consult Note (Signed)
Neurology Consultation Reason for Consult: Left sided weakness Referring Physician: Roxanne Mins, D  CC: left sided weakness  History is obtained from:patient   HPI: Joanne Gonzalez is a 53 y.o. female with a history of storke and seizures who presents with left sided weakness that has been present for at least a few days, but waxing and waning. Since 3pm today, it has been fairly abd.    LKW: unclear.  tpa given?: no, out of window.     ROS: A 14 point ROS was performed and is negative except as noted in the HPI.  Past Medical History:  Diagnosis Date  . CVA (cerebral infarction)   . Schizophrenia ([place])   . Seizures ([place])      Family History  Problem Relation Age of Onset  . Seizures Mother   . Hypertension Mother      Social History:  reports that she has been smoking cigarettes. She has been smoking about 0.00 packs per day. She has never used smokeless tobacco. She reports current drug use. Drugs: Cocaine and Marijuana. She reports that she does not drink alcohol.   Exam: Current vital signs: BP 119/69   Pulse 79   Temp 98.6 F (37 C)   Resp 19   LMP 02/09/2019   SpO2 98%  Vital signs in last 24 hours: Temp:  [98.6 F (37 C)] 98.6 F (37 C) (11/13 2317) Pulse Rate:  [75-85] 79 (11/14 0515) Resp:  [15-25] 19 (11/14 0515) BP: (100-150)/(54-89) 119/69 (11/14 0515) SpO2:  [96 %-99 %] 98 % (11/14 0515)   Physical Exam  Constitutional: Appears well-developed and well-nourished.  Psych: Affect appropriate to situation Eyes: No scleral injection HENT: No OP obstrucion MSK: no joint deformities.  Cardiovascular: Normal rate and regular rhythm.  Respiratory: Effort normal, non-labored breathing GI: Soft.  No distension. There is no tenderness.  Skin: WDI  Neuro: Mental Status: Patient is awake, alert, oriented to person, place, month, year, and situation. Patient is able to give a clear and coherent history. No signs of aphasia or neglect Cranial Nerves: II:  Visual Fields are full. Pupils are equal, round, and reactive to light.   III,IV, VI: EOMI without ptosis or diploplia.  V: Facial sensation is symmetric to temperature VII: Facial movement with left facial weakness, though when asked to puff out her cheeks, she does hold the left side taut VIII: hearing is intact to voice X: Uvula elevates symmetrically XI: Shoulder shrug is symmetric. XII: tongue deviates towards the right Motor: She has 5 out of 5 strength on the right, on the left she has slightly inconsistent exam but with significant weakness of the left arm and leg, 3-4/5 Sensory: Sensation is diminished on the left Deep Tendon Reflexes: 2+ and symmetric in the biceps and patellae.  Cerebellar: FNF and HKS are intact on the right, consistent with weakness in the left    I have reviewed labs in epic and the results pertinent to this consultation are: cmp - unremarkable   I have reviewed the images obtained: CT head - agree with multifocal attenutation of right hemisphere with one area that could be subacute, though I favor chronic.   Impression: 53 year old female with a history of stroke who presents with left-sided weakness.  Though there are some findings on exam that make me wonder if there could be some psychogenic overlay, given the CT findings I think we have to consider true ischemia as well.  Given her degree of weakness, she likely needs to be  admitted for physical therapy, and I would treat her as stroke at the current time.  She currently is refusing MRI.  Recommendations: - HgbA1c, fasting lipid panel - MRI of the brain without contrast if the patient will agree to it - Frequent neuro checks - Echocardiogram - Prophylactic therapy-Antiplatelet med: Aspirin - dose 325mg  PO or 300mg  PR - Risk factor modification - Telemetry monitoring - PT consult, OT consult, Speech consult - Stroke team to follow  Roland Rack, MD [facility] [phone]  If 7pm- 7am, please page neurology on call as listed in [place].

## 2019-02-10 NOTE — Progress Notes (Signed)
STROKE TEAM PROGRESS NOTE   INTERVAL HISTORY No family is at the bedside.  Pt still complains of left sided hemiparesthesia and weakness for one week. Exam showed giveaway weakness on the left. Pt initially refused MRI but I encourage her to have MRI to rule out stroke and will do limited study. She will think about it and will likely do it tomorrow if decided.    OBJECTIVE Vitals:   02/10/19 0345 02/10/19 0430 02/10/19 0500 02/10/19 0515  BP: 102/64 102/63 (!) 100/54 119/69  Pulse:   75 79  Resp: 19 17 15 19   Temp:      SpO2:   98% 98%    CBC:  Recent Labs  Lab 02/09/19 2321  WBC 8.0  NEUTROABS 4.3  HGB 10.3*  HCT 33.2*  MCV 88.5  PLT PLATELET CLUMPS NOTED ON SMEAR, UNABLE TO ESTIMATE    Basic Metabolic Panel:  Recent Labs  Lab 02/09/19 2321  NA 136  K 4.0  CL 107  CO2 20*  GLUCOSE 91  BUN 14  CREATININE 0.72  CALCIUM 8.5*    Lipid Panel: No results found for: CHOL, TRIG, HDL, CHOLHDL, VLDL, LDLCALC HgbA1c:  Lab Results  Component Value Date   HGBA1C 5.8 (H) 09/02/2014   Urine Drug Screen:     Component Value Date/Time   LABOPIA NONE DETECTED 01/14/2019 0631   COCAINSCRNUR POSITIVE (A) 01/14/2019 0631   LABBENZ NONE DETECTED 01/14/2019 0631   AMPHETMU NONE DETECTED 01/14/2019 0631   THCU NONE DETECTED 01/14/2019 0631   LABBARB NONE DETECTED 01/14/2019 0631    Alcohol Level     Component Value Date/Time   ETH <10 02/09/2019 2321    IMAGING  Ct Angio Head W Or Wo Contrast Ct Angio Neck W And/or Wo Contrast 02/10/2019 IMPRESSION:  1. No intracranial arterial occlusion or high-grade stenosis.  2. Multifocal hypoattenuation within the right hemisphere, including a area of loss of gray-white differentiation in the anterior right frontal lobe that may indicate an acute to subacute infarct. MRI recommended for further evaluation.  3. No cervical carotid or vertebral artery stenosis.  4. Mild pulmonary edema.   MRI pending  Transthoracic  Echocardiogram   1. Left ventricular ejection fraction, by visual estimation, is 60 to 65%. The left ventricle has normal function. There is borderline left ventricular hypertrophy.  2. Global right ventricle has normal systolic function.The right ventricular size is normal. No increase in right ventricular wall thickness.  3. Left atrial size was normal.  4. Right atrial size was normal.  5. The mitral valve is grossly normal. Mild mitral valve regurgitation.  6. The tricuspid valve is grossly normal. Tricuspid valve regurgitation is trivial.  7. The aortic valve is tricuspid. Aortic valve regurgitation is not visualized. No evidence of aortic valve sclerosis or stenosis.  8. The pulmonic valve was grossly normal. Pulmonic valve regurgitation is trivial.  9. The inferior vena cava is normal in size with greater than 50% respiratory variability, suggesting right atrial pressure of 3 mmHg. 10. Possible PFO.  ECG - SR rate 76 BPM. (See cardiology reading for complete details)   PHYSICAL EXAM  Temp:  [98.1 F (36.7 C)-98.6 F (37 C)] 98.4 F (36.9 C) (11/14 1330) Pulse Rate:  [75-85] 82 (11/14 1330) Resp:  [15-25] 20 (11/14 1129) BP: (100-150)/(54-89) 122/76 (11/14 1330) SpO2:  [96 %-99 %] 98 % (11/14 1330) Weight:  [65.5 kg] 65.5 kg (11/14 0852)  General - Well nourished, well developed, in no apparent distress.  Ophthalmologic - fundi not visualized due to noncooperation.  Cardiovascular - Regular rhythm and rate.  Mental Status -  Level of arousal and orientation to time, place, and person were intact. Language including expression, naming, repetition, comprehension was assessed and found intact. Mild dysarthria  Cranial Nerves II - XII - II - Visual field intact OU. III, IV, VI - Extraocular movements intact. V - Facial sensation subjectively decreased on the left, 80% comparing with the right. VII - Facial movement intact bilaterally. VIII - Hearing & vestibular intact  bilaterally. X - Palate elevates symmetrically. XI - Chin turning & shoulder shrug intact bilaterally. XII - Tongue protrusion intact.  Motor Strength - The patient's strength was normal in right upper and lower extremities, however, significant giveaway weakness on the left UE and LE. By observation from outside room, she was able to use both hand equally on her cell phone. Hoover's sign positive. Bulk was normal and fasciculations were absent.   Motor Tone - Muscle tone was assessed at the neck and appendages and was normal.  Reflexes - The patient's reflexes were symmetrical in all extremities and she had no pathological reflexes.  Sensory - Light touch, temperature/pinprick were assessed and were subjectively decreased on the left, 80% comparing with the right.    Coordination - The patient had normal movements in the hands and feet with no ataxia or dysmetria.  Tremor was absent.  Gait and Station - deferred.   ASSESSMENT/PLAN Joanne Gonzalez is a 53 y.o. female with history of substance abuse (cocaine and marijuana), tobacco use, schizophrenia, stroke and seizures who presents with left sided weakness that has been present for at least a few days, but waxing and waning. She did not receive IV t-PA due to late presentation (>4.5 hours from time of onset).  Concerning for conversion disorder - MRI pending  CT head - Multifocal hypoattenuation within the right hemisphere, including a area of loss of gray-white differentiation in the anterior right frontal lobe that may indicate an acute to subacute infarct.  Limited MRI head - pending - pt likely agree in am  CTA H&N - unremarkable  2D Echo - EF 60-65%  Sars Corona Virus 2 - neg  LDL - 92  HgbA1c - 5.6  UDS - pending  VTE prophylaxis - Lovenox  No antithrombotic prior to admission, now on aspirin 325 mg daily.   Patient is counseled to be compliant with her antithrombotic medications  Ongoing aggressive stroke risk  factor management  Therapy recommendations:  pending  Disposition:  Pending  Hx of functional exams  03/2013 ER presentation for left-sided weakness, numbness and pain.  Exam was quite functional.  CT unremarkable.  MRI no evidence of acute event.  08/2014 ER presented with speech difficulty and left-sided weakness.  Exam felt to be psychogenic in origin.  CT normal.  Tobacco abuse  Current smoker  Smoking cessation counseling provided  Nicotine patch provided  Pt is willing to quit  Other Stroke Risk Factors  ?? Hx stroke/TIA  Substance Abuse (cocaine and marijuana)  Other Active Problems  Schizophrenia  Hx of seizure ?   Hospital day # 0  Rosalin Hawking, MD PhD Stroke Neurology 02/10/2019 4:03 PM    To contact Stroke Continuity provider, please refer to [email or web address] After hours, contact General Neurology

## 2019-02-10 NOTE — ED Notes (Signed)
Attempted report 
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
## 2019-02-10 NOTE — Progress Notes (Signed)
Patient is refusing MRI.
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
## 2019-02-10 NOTE — ED Notes (Signed)
Lt sided headache

## 2019-02-10 NOTE — ED Notes (Signed)
Pt asking for something to drink ?

## 2019-02-10 NOTE — ED Notes (Signed)
Pt refused her mri

## 2019-02-10 NOTE — ED Notes (Addendum)
Report given to [facility] RN. Pt went upstairs with all belongings that were at the bedside.

## 2019-02-10 NOTE — ED Notes (Signed)
Patient transported to 3W16.  Upon arrival, Pt stated she was missing her necklace.  EMT along with Nurse for [phone] attempted to look for necklace in patients' book bag.  Staff was unable to verify that stated necklace was inside pt bookbag.  Pt refused further assistance looking for stated necklace.  Pt stated that necklace was removed for CAT scan.

## 2019-02-11 ENCOUNTER — Observation Stay (HOSPITAL_COMMUNITY): Payer: Medicaid Other

## 2019-02-11 ENCOUNTER — Encounter (HOSPITAL_COMMUNITY): Payer: Self-pay

## 2019-02-11 DIAGNOSIS — I639 Cerebral infarction, unspecified: Secondary | ICD-10-CM | POA: Diagnosis not present

## 2019-02-11 DIAGNOSIS — F1910 Other psychoactive substance abuse, uncomplicated: Secondary | ICD-10-CM | POA: Diagnosis not present

## 2019-02-11 DIAGNOSIS — F200 Paranoid schizophrenia: Secondary | ICD-10-CM | POA: Diagnosis not present

## 2019-02-11 DIAGNOSIS — R531 Weakness: Secondary | ICD-10-CM | POA: Diagnosis not present

## 2019-02-11 LAB — IRON AND TIBC
Iron: 39 ug/dL (ref 28–170)
Saturation Ratios: 9 % — ABNORMAL LOW (ref 10.4–31.8)
TIBC: 455 ug/dL — ABNORMAL HIGH (ref 250–450)
UIBC: 416 ug/dL

## 2019-02-11 LAB — CBC
HCT: 28.8 % — ABNORMAL LOW (ref 36.0–46.0)
Hemoglobin: 9.2 g/dL — ABNORMAL LOW (ref 12.0–15.0)
MCH: 27.2 pg (ref 26.0–34.0)
MCHC: 31.9 g/dL (ref 30.0–36.0)
MCV: 85.2 fL (ref 80.0–100.0)
Platelets: 363 10*3/uL (ref 150–400)
RBC: 3.38 MIL/uL — ABNORMAL LOW (ref 3.87–5.11)
RDW: 15 % (ref 11.5–15.5)
WBC: 6.6 10*3/uL (ref 4.0–10.5)
nRBC: 0 % (ref 0.0–0.2)

## 2019-02-11 LAB — FOLATE: Folate: 8 ng/mL (ref >5.9)

## 2019-02-11 LAB — BASIC METABOLIC PANEL
Anion gap: 8 (ref 5–15)
BUN: 10 mg/dL (ref 6–20)
CO2: 21 mmol/L — ABNORMAL LOW (ref 22–32)
Calcium: 8.3 mg/dL — ABNORMAL LOW (ref 8.9–10.3)
Chloride: 109 mmol/L (ref 98–111)
Creatinine, Ser: 0.71 mg/dL (ref 0.44–1.00)
GFR calc Af Amer: >60 mL/min (ref >60)
GFR calc non Af Amer: >60 mL/min (ref >60)
Glucose, Bld: 100 mg/dL — ABNORMAL HIGH (ref 70–99)
Potassium: 3.7 mmol/L (ref 3.5–5.1)
Sodium: 138 mmol/L (ref 135–145)

## 2019-02-11 LAB — RETICULOCYTES
Immature Retic Fract: 33.4 % — ABNORMAL HIGH (ref 2.3–15.9)
RBC.: 3.4 MIL/uL — ABNORMAL LOW (ref 3.87–5.11)
Retic Count, Absolute: 69.7 10*3/uL (ref 19.0–186.0)
Retic Ct Pct: 2.1 % (ref 0.4–3.1)

## 2019-02-11 LAB — FERRITIN: Ferritin: 5 ng/mL — ABNORMAL LOW (ref 11–307)

## 2019-02-11 LAB — VITAMIN B12: Vitamin B-12: 237 pg/mL (ref 180–914)

## 2019-02-11 MED ORDER — SODIUM CHLORIDE 0.9 % IV BOLUS
500.0000 mL | Freq: Once | INTRAVENOUS | Status: AC
  Administered 2019-02-11: 500 mL via INTRAVENOUS

## 2019-02-11 MED ORDER — SODIUM CHLORIDE 0.9 % IV SOLN
510.0000 mg | Freq: Once | INTRAVENOUS | Status: AC
  Administered 2019-02-11: 510 mg via INTRAVENOUS
  Filled 2019-02-11: qty 17

## 2019-02-11 MED ORDER — QUETIAPINE FUMARATE 100 MG PO TABS: 100.0000 mg | ORAL_TABLET | Freq: Every day | ORAL | 0 refills | Status: DC

## 2019-02-11 MED ORDER — ASPIRIN EC 81 MG PO TBEC: 81.0000 mg | DELAYED_RELEASE_TABLET | Freq: Every day | ORAL | 0 refills | Status: AC
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
## 2019-02-11 NOTE — Progress Notes (Signed)
NURSING PROGRESS NOTE  Symphanie [place] [id number] Discharge Data: 02/11/2019 5:36 PM Attending Provider: Domenic Polite, MD [id number], No Pcp Per     Jannifer Hick to be D/C'd per MD order.  Discussed with the patient the After Visit Summary and all questions fully answered. All IV's discontinued with no bleeding noted. All belongings returned to patient for patient to take with her to her destination.   Last Vital Signs:  Blood pressure 100/63, pulse 86, temperature 98.8 F (37.1 C), temperature source Oral, resp. rate 16, height 5\' 5"  (1.651 m), weight 65.5 kg, last menstrual period 02/09/2019, SpO2 98 %.  Discharge Medication List Allergies as of 02/11/2019      Reactions   Keflex [cephalexin] Hives   Penicillins Hives   Did it involve swelling of the face/tongue/throat, SOB, or low BP? No Did it involve sudden or severe rash/hives, skin peeling, or any reaction on the inside of your mouth or nose? No Did you need to seek medical attention at a hospital or doctor's office? No When did it last happen?unknown If all above answers are "NO", may proceed with cephalosporin use.   Lactose Intolerance (gi) Nausea And Vomiting, Rash      Medication List    STOP taking these medications   nitrofurantoin (macrocrystal-monohydrate) 100 MG capsule Commonly known as: MACROBID     TAKE these medications   aspirin EC 81 MG tablet Take 1 tablet (81 mg total) by mouth daily. Notes to patient: Take 02/12/2019   QUEtiapine 100 MG tablet Commonly known as: SEROQUEL Take 1 tablet (100 mg total) by mouth at bedtime.   traZODone 100 MG tablet Commonly known as: DESYREL Take 1 tablet (100 mg total) by mouth daily. What changed: when to take this            Durable Medical Equipment  (From admission, onward)         Start     Ordered   02/11/19 1359  For home use only DME Cane  Once    Comments: Off set cane   02/11/19 1359

## 2019-02-11 NOTE — Progress Notes (Signed)
  Speech Language Pathology Treatment:    Patient Details Name: Joanne Gonzalez MRN: [id number] DOB: 17-Mar-1966 Today's Date: 02/11/2019 Time:  -     Assessment / Plan / Recommendation Clinical Impression :   Patient screened for linguistic/cognitive deficits. Pt communicates at the conversational level. She appears to be at her baseline prior to hospitalization. No further ST services recommended at this time.                          Wynelle Bourgeois, MA,. CCC-SLP 02/11/2019, 2:28 PM
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
## 2019-02-26 ENCOUNTER — Emergency Department (HOSPITAL_COMMUNITY)
Admission: EM | Admit: 2019-02-26 | Discharge: 2019-02-26 | Disposition: A | Discharge status: Home / Self Care | Payer: Medicaid Other

## 2019-02-26 ENCOUNTER — Other Ambulatory Visit: Payer: Self-pay

## 2019-02-26 NOTE — ED Notes (Signed)
Pt called multiple times for triage, no answer. 

## 2019-02-26 NOTE — ED Notes (Signed)
Pt called for triage x2 with no response.  

## 2019-02-27 ENCOUNTER — Other Ambulatory Visit: Payer: Self-pay

## 2019-02-27 ENCOUNTER — Encounter (HOSPITAL_COMMUNITY): Payer: Self-pay | Admitting: Emergency Medicine

## 2019-02-27 ENCOUNTER — Emergency Department (HOSPITAL_COMMUNITY)
Admission: EM | Admit: 2019-02-27 | Discharge: 2019-02-27 | Discharge status: Against Medical Advice | Payer: Medicaid Other | Attending: Emergency Medicine | Admitting: Emergency Medicine

## 2019-02-27 DIAGNOSIS — Z5321 Procedure and treatment not carried out due to patient leaving prior to being seen by health care provider: Secondary | ICD-10-CM | POA: Insufficient documentation

## 2019-02-27 DIAGNOSIS — R0789 Other chest pain: Secondary | ICD-10-CM | POA: Diagnosis present

## 2019-02-27 LAB — BASIC METABOLIC PANEL
Anion gap: 13 (ref 5–15)
BUN: 12 mg/dL (ref 6–20)
CO2: 21 mmol/L — ABNORMAL LOW (ref 22–32)
Calcium: 9.6 mg/dL (ref 8.9–10.3)
Chloride: 104 mmol/L (ref 98–111)
Creatinine, Ser: 0.91 mg/dL (ref 0.44–1.00)
GFR calc Af Amer: >60 mL/min (ref >60)
GFR calc non Af Amer: >60 mL/min (ref >60)
Glucose, Bld: 95 mg/dL (ref 70–99)
Potassium: 3.7 mmol/L (ref 3.5–5.1)
Sodium: 138 mmol/L (ref 135–145)

## 2019-02-27 LAB — TROPONIN I (HIGH SENSITIVITY): Troponin I (High Sensitivity): 6 ng/L (ref <18)

## 2019-02-27 LAB — CBC
HCT: 36.2 % (ref 36.0–46.0)
Hemoglobin: 11.6 g/dL — ABNORMAL LOW (ref 12.0–15.0)
MCH: 29 pg (ref 26.0–34.0)
MCHC: 32 g/dL (ref 30.0–36.0)
MCV: 90.5 fL (ref 80.0–100.0)
Platelets: 357 10*3/uL (ref 150–400)
RBC: 4 MIL/uL (ref 3.87–5.11)
RDW: 20 % — ABNORMAL HIGH (ref 11.5–15.5)
WBC: 7.2 10*3/uL (ref 4.0–10.5)
nRBC: 0 % (ref 0.0–0.2)

## 2019-02-27 LAB — I-STAT BETA HCG BLOOD, ED (MC, WL, AP ONLY): I-stat hCG, quantitative: <5 m[IU]/mL (ref <5)

## 2019-02-27 MED ORDER — SODIUM CHLORIDE 0.9% FLUSH: 3.0000 mL | Freq: Once | INTRAVENOUS | Status: DC

## 2019-02-27 NOTE — ED Triage Notes (Signed)
Per EMS Pt reports left chest pain, left breast pain and left arm weakness X4 weeks.  Pt was given 324 by EMS.    Pt states she "only wants my left breast checked, it is always sore and I want to make sure there are no lumps."

## 2019-02-28 ENCOUNTER — Emergency Department (HOSPITAL_COMMUNITY): Payer: Medicaid Other

## 2019-02-28 ENCOUNTER — Encounter (HOSPITAL_COMMUNITY): Payer: Self-pay | Admitting: Behavioral Health

## 2019-02-28 ENCOUNTER — Emergency Department (HOSPITAL_COMMUNITY)
Admission: EM | Admit: 2019-02-28 | Discharge: 2019-03-01 | Disposition: A | Discharge status: Home / Self Care | Payer: Medicaid Other | Attending: Emergency Medicine | Admitting: Emergency Medicine

## 2019-02-28 ENCOUNTER — Other Ambulatory Visit: Payer: Self-pay

## 2019-02-28 DIAGNOSIS — R531 Weakness: Secondary | ICD-10-CM | POA: Insufficient documentation

## 2019-02-28 DIAGNOSIS — R2990 Unspecified symptoms and signs involving the nervous system: Secondary | ICD-10-CM

## 2019-02-28 DIAGNOSIS — Z590 Homelessness: Secondary | ICD-10-CM | POA: Insufficient documentation

## 2019-02-28 DIAGNOSIS — F209 Schizophrenia, unspecified: Secondary | ICD-10-CM | POA: Insufficient documentation

## 2019-02-28 DIAGNOSIS — Z7982 Long term (current) use of aspirin: Secondary | ICD-10-CM | POA: Insufficient documentation

## 2019-02-28 DIAGNOSIS — Z79899 Other long term (current) drug therapy: Secondary | ICD-10-CM | POA: Diagnosis not present

## 2019-02-28 DIAGNOSIS — F17210 Nicotine dependence, cigarettes, uncomplicated: Secondary | ICD-10-CM | POA: Insufficient documentation

## 2019-02-28 DIAGNOSIS — T50904A Poisoning by unspecified drugs, medicaments and biological substances, undetermined, initial encounter: Secondary | ICD-10-CM

## 2019-02-28 LAB — CBC
HCT: 37.8 % (ref 36.0–46.0)
Hemoglobin: 12 g/dL (ref 12.0–15.0)
MCH: 28.6 pg (ref 26.0–34.0)
MCHC: 31.7 g/dL (ref 30.0–36.0)
MCV: 90.2 fL (ref 80.0–100.0)
Platelets: 417 10*3/uL — ABNORMAL HIGH (ref 150–400)
RBC: 4.19 MIL/uL (ref 3.87–5.11)
RDW: 19.9 % — ABNORMAL HIGH (ref 11.5–15.5)
WBC: 8.6 10*3/uL (ref 4.0–10.5)
nRBC: 0 % (ref 0.0–0.2)

## 2019-02-28 LAB — COMPREHENSIVE METABOLIC PANEL
ALT: 19 U/L (ref 0–44)
AST: 27 U/L (ref 15–41)
Albumin: 4 g/dL (ref 3.5–5.0)
Alkaline Phosphatase: 79 U/L (ref 38–126)
Anion gap: 12 (ref 5–15)
BUN: 11 mg/dL (ref 6–20)
CO2: 22 mmol/L (ref 22–32)
Calcium: 9.1 mg/dL (ref 8.9–10.3)
Chloride: 107 mmol/L (ref 98–111)
Creatinine, Ser: 0.89 mg/dL (ref 0.44–1.00)
GFR calc Af Amer: >60 mL/min (ref >60)
GFR calc non Af Amer: >60 mL/min (ref >60)
Glucose, Bld: 121 mg/dL — ABNORMAL HIGH (ref 70–99)
Potassium: 3.5 mmol/L (ref 3.5–5.1)
Sodium: 141 mmol/L (ref 135–145)
Total Bilirubin: 0.4 mg/dL (ref 0.3–1.2)
Total Protein: 7.4 g/dL (ref 6.5–8.1)

## 2019-02-28 LAB — I-STAT CHEM 8, ED
BUN: 12 mg/dL (ref 6–20)
Calcium, Ion: 1.14 mmol/L — ABNORMAL LOW (ref 1.15–1.40)
Chloride: 107 mmol/L (ref 98–111)
Creatinine, Ser: 0.8 mg/dL (ref 0.44–1.00)
Glucose, Bld: 116 mg/dL — ABNORMAL HIGH (ref 70–99)
HCT: 40 % (ref 36.0–46.0)
Hemoglobin: 13.6 g/dL (ref 12.0–15.0)
Potassium: 3.4 mmol/L — ABNORMAL LOW (ref 3.5–5.1)
Sodium: 141 mmol/L (ref 135–145)
TCO2: 25 mmol/L (ref 22–32)

## 2019-02-28 LAB — DIFFERENTIAL
Abs Immature Granulocytes: 0.02 10*3/uL (ref 0.00–0.07)
Basophils Absolute: 0.1 10*3/uL (ref 0.0–0.1)
Basophils Relative: 1 %
Eosinophils Absolute: 0.2 10*3/uL (ref 0.0–0.5)
Eosinophils Relative: 3 %
Immature Granulocytes: 0 %
Lymphocytes Relative: 20 %
Lymphs Abs: 1.7 10*3/uL (ref 0.7–4.0)
Monocytes Absolute: 0.7 10*3/uL (ref 0.1–1.0)
Monocytes Relative: 8 %
Neutro Abs: 5.9 10*3/uL (ref 1.7–7.7)
Neutrophils Relative %: 68 %

## 2019-02-28 LAB — I-STAT BETA HCG BLOOD, ED (MC, WL, AP ONLY): I-stat hCG, quantitative: <5 m[IU]/mL (ref <5)

## 2019-02-28 LAB — PROTIME-INR
INR: 0.9 (ref 0.8–1.2)
Prothrombin Time: 12.5 seconds (ref 11.4–15.2)

## 2019-02-28 LAB — APTT: aPTT: 28 seconds (ref 24–36)

## 2019-02-28 LAB — ETHANOL: Alcohol, Ethyl (B): <10 mg/dL (ref <10)

## 2019-02-28 MED ORDER — LORAZEPAM 2 MG/ML IJ SOLN: 2.0000 mg | Freq: Once | INTRAMUSCULAR | Status: DC

## 2019-02-28 MED ORDER — ASPIRIN EC 81 MG PO TBEC
81.0000 mg | DELAYED_RELEASE_TABLET | Freq: Every day | ORAL | Status: DC
  Administered 2019-02-28: 81 mg via ORAL
  Filled 2019-02-28: qty 1

## 2019-02-28 MED ORDER — QUETIAPINE FUMARATE 50 MG PO TABS
100.0000 mg | ORAL_TABLET | Freq: Every day | ORAL | Status: DC
  Administered 2019-02-28: 100 mg via ORAL
  Filled 2019-02-28: qty 1

## 2019-02-28 MED ORDER — TRAZODONE HCL 100 MG PO TABS
100.0000 mg | ORAL_TABLET | Freq: Every day | ORAL | Status: DC
  Filled 2019-02-28: qty 2

## 2019-02-28 NOTE — ED Provider Notes (Signed)
Per TTS, patient to have psychiatric evaluation in the morning to assess for stability and arrange for disposition.   Daleen Bo, MD 02/28/19 2105
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
## 2019-02-28 NOTE — ED Notes (Signed)
Dinner Tray Ordered @ 1857-per [employer], RN called by Levada Dy

## 2019-02-28 NOTE — ED Notes (Signed)
Joanne Gonzalez
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
## 2019-03-01 DIAGNOSIS — Z008 Encounter for other general examination: Secondary | ICD-10-CM

## 2019-03-01 DIAGNOSIS — R4182 Altered mental status, unspecified: Secondary | ICD-10-CM

## 2019-03-01 NOTE — ED Provider Notes (Signed)
Patient has been seen by TTS and is felt to be cleared from a psychiatric standpoint and appropriate for discharge.   Veryl Speak, MD 03/01/19 1055

## 2019-03-01 NOTE — ED Notes (Signed)
Patient is NPO at this time, No Diet was ordered for Lunch.

## 2019-03-01 NOTE — ED Notes (Signed)
Patient verbalizes understanding of discharge instructions. Opportunity for questioning and answers were provided. Armband removed by staff, pt discharged from ED.  

## 2019-03-01 NOTE — Discharge Instructions (Addendum)
Follow-up with outpatient behavioral health as suggested by TTS.  Return to the emergency department if symptoms significantly worsen or change.

## 2019-03-01 NOTE — Discharge Planning (Signed)
EDCM to follow for disposition needs.  

## 2019-03-01 NOTE — ED Notes (Signed)
Patient was given A Sprite and Catheryn Bacon, Diet was ordered for Lunch.
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
## 2019-11-07 ENCOUNTER — Emergency Department (HOSPITAL_COMMUNITY): Payer: Medicaid Other

## 2019-11-07 ENCOUNTER — Encounter (HOSPITAL_COMMUNITY): Payer: Self-pay

## 2019-11-07 ENCOUNTER — Emergency Department (HOSPITAL_COMMUNITY)
Admission: EM | Admit: 2019-11-07 | Discharge: 2019-11-07 | Disposition: A | Discharge status: Other Acute Inpatient Hospital | Payer: Medicaid Other | Attending: Emergency Medicine | Admitting: Emergency Medicine

## 2019-11-07 ENCOUNTER — Other Ambulatory Visit: Payer: Self-pay

## 2019-11-07 ENCOUNTER — Ambulatory Visit (HOSPITAL_COMMUNITY)
Admission: EM | Admit: 2019-11-07 | Discharge: 2019-11-08 | Disposition: A | Discharge status: Home / Self Care | Payer: Medicaid Other | Source: Home / Self Care

## 2019-11-07 DIAGNOSIS — F4321 Adjustment disorder with depressed mood: Secondary | ICD-10-CM

## 2019-11-07 DIAGNOSIS — Z20822 Contact with and (suspected) exposure to covid-19: Secondary | ICD-10-CM | POA: Insufficient documentation

## 2019-11-07 DIAGNOSIS — Z79899 Other long term (current) drug therapy: Secondary | ICD-10-CM | POA: Insufficient documentation

## 2019-11-07 DIAGNOSIS — F1910 Other psychoactive substance abuse, uncomplicated: Secondary | ICD-10-CM

## 2019-11-07 DIAGNOSIS — G40909 Epilepsy, unspecified, not intractable, without status epilepticus: Secondary | ICD-10-CM | POA: Insufficient documentation

## 2019-11-07 DIAGNOSIS — F333 Major depressive disorder, recurrent, severe with psychotic symptoms: Secondary | ICD-10-CM | POA: Diagnosis not present

## 2019-11-07 DIAGNOSIS — X509XXA Other and unspecified overexertion or strenuous movements or postures, initial encounter: Secondary | ICD-10-CM | POA: Insufficient documentation

## 2019-11-07 DIAGNOSIS — F17210 Nicotine dependence, cigarettes, uncomplicated: Secondary | ICD-10-CM | POA: Insufficient documentation

## 2019-11-07 DIAGNOSIS — F200 Paranoid schizophrenia: Secondary | ICD-10-CM | POA: Insufficient documentation

## 2019-11-07 DIAGNOSIS — Y939 Activity, unspecified: Secondary | ICD-10-CM | POA: Diagnosis not present

## 2019-11-07 DIAGNOSIS — M25572 Pain in left ankle and joints of left foot: Secondary | ICD-10-CM | POA: Insufficient documentation

## 2019-11-07 DIAGNOSIS — F19959 Other psychoactive substance use, unspecified with psychoactive substance-induced psychotic disorder, unspecified: Secondary | ICD-10-CM | POA: Insufficient documentation

## 2019-11-07 DIAGNOSIS — Z8673 Personal history of transient ischemic attack (TIA), and cerebral infarction without residual deficits: Secondary | ICD-10-CM | POA: Insufficient documentation

## 2019-11-07 DIAGNOSIS — F1420 Cocaine dependence, uncomplicated: Secondary | ICD-10-CM | POA: Insufficient documentation

## 2019-11-07 DIAGNOSIS — Y929 Unspecified place or not applicable: Secondary | ICD-10-CM | POA: Diagnosis not present

## 2019-11-07 DIAGNOSIS — Y999 Unspecified external cause status: Secondary | ICD-10-CM | POA: Diagnosis not present

## 2019-11-07 DIAGNOSIS — W19XXXA Unspecified fall, initial encounter: Secondary | ICD-10-CM

## 2019-11-07 DIAGNOSIS — F1290 Cannabis use, unspecified, uncomplicated: Secondary | ICD-10-CM | POA: Diagnosis not present

## 2019-11-07 DIAGNOSIS — F329 Major depressive disorder, single episode, unspecified: Secondary | ICD-10-CM | POA: Diagnosis present

## 2019-11-07 DIAGNOSIS — R45851 Suicidal ideations: Secondary | ICD-10-CM

## 2019-11-07 LAB — COMPREHENSIVE METABOLIC PANEL
ALT: 25 U/L (ref 0–44)
AST: 24 U/L (ref 15–41)
Albumin: 4.4 g/dL (ref 3.5–5.0)
Alkaline Phosphatase: 80 U/L (ref 38–126)
Anion gap: 12 (ref 5–15)
BUN: 8 mg/dL (ref 6–20)
CO2: 21 mmol/L — ABNORMAL LOW (ref 22–32)
Calcium: 9 mg/dL (ref 8.9–10.3)
Chloride: 101 mmol/L (ref 98–111)
Creatinine, Ser: 0.64 mg/dL (ref 0.44–1.00)
GFR calc Af Amer: >60 mL/min (ref >60)
GFR calc non Af Amer: >60 mL/min (ref >60)
Glucose, Bld: 99 mg/dL (ref 70–99)
Potassium: 3.5 mmol/L (ref 3.5–5.1)
Sodium: 134 mmol/L — ABNORMAL LOW (ref 135–145)
Total Bilirubin: 0.6 mg/dL (ref 0.3–1.2)
Total Protein: 8.3 g/dL — ABNORMAL HIGH (ref 6.5–8.1)

## 2019-11-07 LAB — I-STAT BETA HCG BLOOD, ED (MC, WL, AP ONLY): I-stat hCG, quantitative: <5 m[IU]/mL (ref <5)

## 2019-11-07 LAB — SALICYLATE LEVEL: Salicylate Lvl: <7 mg/dL — ABNORMAL LOW (ref 7.0–30.0)

## 2019-11-07 LAB — RAPID URINE DRUG SCREEN, HOSP PERFORMED
Amphetamines: NOT DETECTED
Barbiturates: NOT DETECTED
Benzodiazepines: NOT DETECTED
Cocaine: POSITIVE — AB
Opiates: NOT DETECTED
Tetrahydrocannabinol: NOT DETECTED

## 2019-11-07 LAB — CBC
HCT: 35.9 % — ABNORMAL LOW (ref 36.0–46.0)
Hemoglobin: 11.3 g/dL — ABNORMAL LOW (ref 12.0–15.0)
MCH: 26.5 pg (ref 26.0–34.0)
MCHC: 31.5 g/dL (ref 30.0–36.0)
MCV: 84.1 fL (ref 80.0–100.0)
Platelets: 362 10*3/uL (ref 150–400)
RBC: 4.27 MIL/uL (ref 3.87–5.11)
RDW: 15.9 % — ABNORMAL HIGH (ref 11.5–15.5)
WBC: 10.1 10*3/uL (ref 4.0–10.5)
nRBC: 0 % (ref 0.0–0.2)

## 2019-11-07 LAB — ACETAMINOPHEN LEVEL: Acetaminophen (Tylenol), Serum: <10 ug/mL — ABNORMAL LOW (ref 10–30)

## 2019-11-07 LAB — SARS CORONAVIRUS 2 BY RT PCR (HOSPITAL ORDER, PERFORMED IN ~~LOC~~ HOSPITAL LAB): SARS Coronavirus 2: NEGATIVE

## 2019-11-07 LAB — ETHANOL: Alcohol, Ethyl (B): <10 mg/dL (ref <10)

## 2019-11-07 MED ORDER — ASPIRIN EC 81 MG PO TBEC
81.0000 mg | DELAYED_RELEASE_TABLET | Freq: Once | ORAL | Status: AC
  Administered 2019-11-07: 81 mg via ORAL
  Filled 2019-11-07: qty 1

## 2019-11-07 MED ORDER — IBUPROFEN 200 MG PO TABS
600.0000 mg | ORAL_TABLET | Freq: Three times a day (TID) | ORAL | Status: DC | PRN
  Administered 2019-11-07: 600 mg via ORAL
  Filled 2019-11-07: qty 3

## 2019-11-07 MED ORDER — QUETIAPINE FUMARATE 100 MG PO TABS
100.0000 mg | ORAL_TABLET | Freq: Every day | ORAL | Status: DC
  Administered 2019-11-07: 100 mg via ORAL
  Filled 2019-11-07: qty 1

## 2019-11-07 MED ORDER — TRAZODONE HCL 100 MG PO TABS
100.0000 mg | ORAL_TABLET | Freq: Every day | ORAL | Status: DC
  Filled 2019-11-07: qty 1

## 2019-11-07 MED ORDER — ACETAMINOPHEN 325 MG PO TABS
650.0000 mg | ORAL_TABLET | Freq: Four times a day (QID) | ORAL | Status: DC | PRN
  Administered 2019-11-07: 650 mg via ORAL
  Filled 2019-11-07: qty 2

## 2019-11-07 MED ORDER — MAGNESIUM HYDROXIDE 400 MG/5ML PO SUSP: 30.0000 mL | Freq: Every day | ORAL | Status: DC | PRN

## 2019-11-07 MED ORDER — ONDANSETRON HCL 4 MG PO TABS: 4.0000 mg | ORAL_TABLET | Freq: Three times a day (TID) | ORAL | Status: DC | PRN

## 2019-11-07 MED ORDER — ALUM & MAG HYDROXIDE-SIMETH 200-200-20 MG/5ML PO SUSP: 30.0000 mL | ORAL | Status: DC | PRN

## 2019-11-07 NOTE — ED Provider Notes (Signed)
Emergency Medicine Observation Re-evaluation Note  Lenna Hagarty is a 54 y.o. female, seen on rounds today.  Pt initially presented to the ED for complaints of Suicidal Currently, the patient is calm.  Physical Exam  BP (P) 105/71 (BP Location: Right Arm)   Pulse (P) 75   Temp (P) 99 F (37.2 C) (Oral)   Resp (P) 16   Ht 5\' 5"  (1.651 m)   Wt 67 kg   SpO2 (P) 94%   BMI 24.58 kg/m  Physical Exam  ED Course / MDM  EKG:    I have reviewed the labs performed to date as well as medications administered while in observation.  Recent changes in the last 24 hours include behavioral health evaluation. Plan  Current plan is for transfer to [facility]. Patient is under full IVC at this time.   Hayden Rasmussen, MD 11/07/19 Lurena Nida

## 2019-11-07 NOTE — ED Notes (Signed)
Patient alert and oriented. Patient denies SI on admission. Patient oriented to unit and staff. Patient and skin assessed and was found without any skin issues and skin was intact. Patient and belongings searched and no contraband found. Patient given fluids and meal. Monitoring continues.
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
## 2019-11-07 NOTE — ED Notes (Signed)
Patient belongings in locker # 5.

## 2019-11-07 NOTE — ED Notes (Signed)
Patient brought in by EMS accompanied by [employer], took shower on arrival, was difficult to calm down to start,  given ice pack for ankle and patient slept most of night, TTS consult pending.

## 2019-11-07 NOTE — ED Notes (Addendum)
Patient given coca cola and graham crackers for snack.

## 2019-11-07 NOTE — ED Notes (Signed)
[facility] called.  They are 1.5 hours behind schedule.

## 2019-11-08 MED ORDER — QUETIAPINE FUMARATE 100 MG PO TABS: 100.0000 mg | ORAL_TABLET | Freq: Every day | ORAL | 0 refills | Status: DC

## 2019-11-08 MED ORDER — TRAZODONE HCL 100 MG PO TABS: 100.0000 mg | ORAL_TABLET | Freq: Every day | ORAL | 0 refills | Status: DC
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
## 2019-11-08 NOTE — ED Notes (Signed)
Patient awake in bed with eyes opened. Patient denies SI does voice some HI thoughts against her brother. Patient provided support and encouragement. Monitoring continues.

## 2019-11-08 NOTE — Discharge Instructions (Signed)
Keep scheduled appointments Take medications as prescribed

## 2019-11-08 NOTE — ED Notes (Signed)
Patient discharged.

## 2020-01-01 ENCOUNTER — Emergency Department (HOSPITAL_BASED_OUTPATIENT_CLINIC_OR_DEPARTMENT_OTHER)
Admission: EM | Admit: 2020-01-01 | Discharge: 2020-01-01 | Disposition: A | Discharge status: Home / Self Care | Payer: Medicaid Other | Attending: Emergency Medicine | Admitting: Emergency Medicine

## 2020-01-01 ENCOUNTER — Telehealth (HOSPITAL_BASED_OUTPATIENT_CLINIC_OR_DEPARTMENT_OTHER): Payer: Self-pay | Admitting: Emergency Medicine

## 2020-01-01 ENCOUNTER — Emergency Department (HOSPITAL_BASED_OUTPATIENT_CLINIC_OR_DEPARTMENT_OTHER): Payer: Medicaid Other

## 2020-01-01 ENCOUNTER — Encounter (HOSPITAL_BASED_OUTPATIENT_CLINIC_OR_DEPARTMENT_OTHER): Payer: Self-pay | Admitting: Emergency Medicine

## 2020-01-01 ENCOUNTER — Other Ambulatory Visit: Payer: Self-pay

## 2020-01-01 DIAGNOSIS — Z7982 Long term (current) use of aspirin: Secondary | ICD-10-CM | POA: Diagnosis not present

## 2020-01-01 DIAGNOSIS — F17210 Nicotine dependence, cigarettes, uncomplicated: Secondary | ICD-10-CM | POA: Diagnosis not present

## 2020-01-01 DIAGNOSIS — M79641 Pain in right hand: Secondary | ICD-10-CM | POA: Insufficient documentation

## 2020-01-01 NOTE — Discharge Instructions (Signed)
Your x-ray does not show any fracture or dislocation.  I recommend taking over-the-counter pain medications like ibuprofen and or Tylenol every 6 hours as needed for pain.  I also recommend applying warm compresses over the area and stretching out your hand as this will help with muscle stiffness and pain.  I would like you to follow-up with a hand specialist as you feel you may need further evaluation management of your hand.  Come back to emergency department if you develop chest pain, shortness of breath, severe abdominal pain, uncontrolled nausea, vomiting, diarrhea.
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
## 2020-01-07 ENCOUNTER — Other Ambulatory Visit: Payer: Self-pay

## 2020-01-07 ENCOUNTER — Emergency Department (HOSPITAL_BASED_OUTPATIENT_CLINIC_OR_DEPARTMENT_OTHER)
Admission: EM | Admit: 2020-01-07 | Discharge: 2020-01-07 | Disposition: A | Discharge status: Home / Self Care | Payer: Medicaid Other | Attending: Emergency Medicine | Admitting: Emergency Medicine

## 2020-01-07 ENCOUNTER — Encounter (HOSPITAL_BASED_OUTPATIENT_CLINIC_OR_DEPARTMENT_OTHER): Payer: Self-pay | Admitting: *Deleted

## 2020-01-07 DIAGNOSIS — R300 Dysuria: Secondary | ICD-10-CM | POA: Diagnosis present

## 2020-01-07 DIAGNOSIS — F17210 Nicotine dependence, cigarettes, uncomplicated: Secondary | ICD-10-CM | POA: Insufficient documentation

## 2020-01-07 DIAGNOSIS — N390 Urinary tract infection, site not specified: Secondary | ICD-10-CM | POA: Insufficient documentation

## 2020-01-07 LAB — URINALYSIS, MICROSCOPIC (REFLEX)

## 2020-01-07 LAB — URINALYSIS, ROUTINE W REFLEX MICROSCOPIC
Bilirubin Urine: NEGATIVE
Glucose, UA: NEGATIVE mg/dL
Hgb urine dipstick: NEGATIVE
Ketones, ur: NEGATIVE mg/dL
Nitrite: POSITIVE — AB
Protein, ur: NEGATIVE mg/dL
Specific Gravity, Urine: 1.015 (ref 1.005–1.030)
pH: 6.5 (ref 5.0–8.0)

## 2020-01-07 LAB — PREGNANCY, URINE: Preg Test, Ur: NEGATIVE

## 2020-01-07 MED ORDER — SULFAMETHOXAZOLE-TRIMETHOPRIM 800-160 MG PO TABS
1.0000 | ORAL_TABLET | Freq: Once | ORAL | Status: AC
  Administered 2020-01-07: 1 via ORAL
  Filled 2020-01-07: qty 1

## 2020-01-07 MED ORDER — SULFAMETHOXAZOLE-TRIMETHOPRIM 800-160 MG PO TABS: 1.0000 | ORAL_TABLET | Freq: Two times a day (BID) | ORAL | 0 refills | Status: AC

## 2020-01-07 NOTE — ED Triage Notes (Signed)
Pt from [facility], multiple complaints , urinary freq and pain and right abd pain x 2 days

## 2020-01-07 NOTE — ED Provider Notes (Signed)
[facility] EMERGENCY DEPARTMENT Provider Note   CSN: [id number] Arrival date & time: 01/07/20  1346     History Chief Complaint  Patient presents with  . Dysuria    Joanne Gonzalez is a 54 y.o. female.  The history is provided by the patient.  Dysuria Pain quality:  Burning Pain severity:  Mild Onset quality:  Gradual Timing:  Constant Progression:  Unchanged Chronicity:  New Relieved by:  Nothing Ineffective treatments:  None tried Associated symptoms: flank pain (mild right lower pain)   Associated symptoms: no abdominal pain, no fever, no genital lesions, no nausea, no vaginal discharge and no vomiting   Risk factors: no hx of urolithiasis        Past Medical History:  Diagnosis Date  . CVA (cerebral infarction)   . Schizophrenia ([place])   . Seizures [facility])     Patient Active Problem List   Diagnosis Date Noted  . Stroke (cerebrum) ([place]) 02/10/2019  . Hypocalcemia 02/10/2019  . History of seizures 02/10/2019  . Homelessness 11/26/2018  . Adjustment disorder with depressed mood 11/26/2018  . Polysubstance abuse ([place])   . Iron deficiency anemia 09/01/2014  . Cocaine use 09/01/2014  . Hyperglycemia 09/01/2014  . Schizophrenia, paranoid ([place]) 08/31/2014  . Left-sided weakness 08/30/2014  . Hemiplegia, unspecified, affecting nondominant side 04/13/2013    Past Surgical History:  Procedure Laterality Date  . LIMB SPARING RESECTION HIP W/ SADDLE JOINT REPLACEMENT       OB History   No obstetric history on file.     Family History  Problem Relation Age of Onset  . Seizures Mother   . Hypertension Mother     Social History   Tobacco Use  . Smoking status: Current Every Day Smoker    Packs/day: 0.00    Types: Cigarettes  . Smokeless tobacco: Never Used  Substance Use Topics  . Alcohol use: No    Comment: BAC not available  . Drug use: Yes    Types: Cocaine, Marijuana    Comment: UDS not available for assessment    Home  Medications Prior to Admission medications   Medication Sig Start Date End Date Taking? Authorizing Provider  QUEtiapine (SEROQUEL) 100 MG tablet Take 1 tablet (100 mg total) by mouth at bedtime. 11/08/19   Money, Lowry Ram, FNP  sulfamethoxazole-trimethoprim (BACTRIM DS) 800-160 MG tablet Take 1 tablet by mouth 2 (two) times daily for 7 days. 01/07/20 01/14/20  Fred Hammes, DO  traZODone (DESYREL) 100 MG tablet Take 1 tablet (100 mg total) by mouth at bedtime. 11/08/19   Money, Lowry Ram, FNP  ferrous sulfate 325 (65 FE) MG tablet Take 1 tablet (325 mg total) by mouth daily with breakfast. Patient not taking: Reported on 03/14/2015 09/02/14 03/14/15  Luan Moore, MD  sertraline (ZOLOFT) 50 MG tablet Take 1 tablet (50 mg total) by mouth daily. Patient not taking: Reported on 03/14/2015 09/02/14 03/14/15  Luan Moore, MD    Allergies    Keflex [cephalexin], Penicillins, and Lactose intolerance (gi)  Review of Systems   Review of Systems  Constitutional: Negative for chills and fever.  HENT: Negative for ear pain and sore throat.   Eyes: Negative for pain and visual disturbance.  Respiratory: Negative for cough and shortness of breath.   Cardiovascular: Negative for chest pain and palpitations.  Gastrointestinal: Negative for abdominal pain, nausea and vomiting.  Genitourinary: Positive for dysuria and flank pain (mild right lower pain). Negative for hematuria and vaginal discharge.  Musculoskeletal: Negative for  arthralgias and back pain.  Skin: Negative for color change and rash.  Neurological: Negative for seizures and syncope.  All other systems reviewed and are negative.   Physical Exam Updated Vital Signs BP 132/77   Pulse 74   Temp 98.6 F (37 C) (Oral)   Resp 18   Ht 5\' 4"  (1.626 m)   Wt 65.3 kg   SpO2 98%   BMI 24.72 kg/m   Physical Exam Vitals and nursing note reviewed.  Constitutional:      General: She is not in acute distress.    Appearance: She is  well-developed.  HENT:     Head: Normocephalic and atraumatic.     Nose: Nose normal.     Mouth/Throat:     Mouth: Mucous membranes are moist.  Eyes:     Extraocular Movements: Extraocular movements intact.     Conjunctiva/sclera: Conjunctivae normal.     Pupils: Pupils are equal, round, and reactive to light.  Cardiovascular:     Rate and Rhythm: Normal rate and regular rhythm.     Pulses: Normal pulses.     Heart sounds: Normal heart sounds. No murmur heard.   Pulmonary:     Effort: Pulmonary effort is normal. No respiratory distress.     Breath sounds: Normal breath sounds.  Abdominal:     General: Abdomen is flat.     Palpations: Abdomen is soft.     Tenderness: There is no abdominal tenderness. There is no right CVA tenderness.  Musculoskeletal:     Cervical back: Neck supple.  Skin:    General: Skin is warm and dry.     Capillary Refill: Capillary refill takes less than 2 seconds.  Neurological:     General: No focal deficit present.     Mental Status: She is alert.     ED Results / Procedures / Treatments   Labs (all labs ordered are listed, but only abnormal results are displayed) Labs Reviewed  URINALYSIS, ROUTINE W REFLEX MICROSCOPIC - Abnormal; Notable for the following components:      Result Value   APPearance CLOUDY (*)    Nitrite POSITIVE (*)    Leukocytes,Ua SMALL (*)    All other components within normal limits  URINALYSIS, MICROSCOPIC (REFLEX) - Abnormal; Notable for the following components:   Bacteria, UA MANY (*)    Trichomonas, UA PRESENT (*)    All other components within normal limits  PREGNANCY, URINE    EKG None  Radiology No results found.  Procedures Procedures (including critical care time)  Medications Ordered in ED Medications  sulfamethoxazole-trimethoprim (BACTRIM DS) 800-160 MG per tablet 1 tablet (has no administration in time range)    ED Course  I have reviewed the triage vital signs and the nursing  notes.  Pertinent labs & imaging results that were available during my care of the patient were reviewed by me and considered in my medical decision making (see chart for details).    MDM Rules/Calculators/A&P                          Joanne Gonzalez is a 54 year old female who presents to the ED with pain with urination.  Normal vitals.  No fever.  Urinalysis performed consistent with UTI.  Has had some right lower back pain intermittently but no history of kidney stones.  No hematuria.  Overall appears comfortable.  No concern for pyelonephritis or kidney stone given overall no severe pain, no hematuria.  Patient with no nausea or vomiting.  Will prescribe antibiotics.  Given return precautions and discharged in ED in good condition.  This chart was dictated using voice recognition software.  Despite best efforts to proofread,  errors can occur which can change the documentation meaning.   Final Clinical Impression(s) / ED Diagnoses Final diagnoses:  Lower urinary tract infectious disease    Rx / DC Orders ED Discharge Orders         Ordered    Urine Culture        01/07/20 1539    sulfamethoxazole-trimethoprim (BACTRIM DS) 800-160 MG tablet  2 times daily        01/07/20 [place], [place], DO 01/07/20 1543

## 2020-01-08 ENCOUNTER — Emergency Department (HOSPITAL_BASED_OUTPATIENT_CLINIC_OR_DEPARTMENT_OTHER)
Admission: EM | Admit: 2020-01-08 | Discharge: 2020-01-08 | Disposition: A | Discharge status: Home / Self Care | Payer: Medicaid Other | Attending: Emergency Medicine | Admitting: Emergency Medicine

## 2020-01-08 ENCOUNTER — Encounter (HOSPITAL_BASED_OUTPATIENT_CLINIC_OR_DEPARTMENT_OTHER): Payer: Self-pay | Admitting: Emergency Medicine

## 2020-01-08 DIAGNOSIS — R21 Rash and other nonspecific skin eruption: Secondary | ICD-10-CM | POA: Insufficient documentation

## 2020-01-08 DIAGNOSIS — Z87891 Personal history of nicotine dependence: Secondary | ICD-10-CM | POA: Insufficient documentation

## 2020-01-08 DIAGNOSIS — Z8673 Personal history of transient ischemic attack (TIA), and cerebral infarction without residual deficits: Secondary | ICD-10-CM | POA: Diagnosis not present

## 2020-01-08 MED ORDER — HYDROCORTISONE 1 % EX CREA: TOPICAL_CREAM | CUTANEOUS | 0 refills | Status: DC

## 2020-01-08 MED ORDER — METRONIDAZOLE 500 MG PO TABS
2000.0000 mg | ORAL_TABLET | Freq: Once | ORAL | Status: AC
  Administered 2020-01-08: 2000 mg via ORAL
  Filled 2020-01-08: qty 4

## 2020-01-08 MED ORDER — NITROFURANTOIN MONOHYD MACRO 100 MG PO CAPS: 100.0000 mg | ORAL_CAPSULE | Freq: Two times a day (BID) | ORAL | 0 refills | Status: DC

## 2020-01-08 NOTE — Discharge Instructions (Addendum)
Please stop taking the Bactrim as you may be allergic to this.  Have also given you a new prescription for Macrobid which will treat your urinary tract infection.  You are positive for trichomonas yesterday which is a sexually transmitted disease.  Have treated you today for that.  Please of all sexual partners that you have been treated for trichomonas and they need to be treated as well.  In terms of the red dots this may be insect bites or reaction to the medication.  Apply the topical cream to the area.  Can take Benadryl for any itching.  Develop any worsening symptoms return the ER.

## 2020-01-08 NOTE — ED Triage Notes (Signed)
Pt arrives pov with family with c/o rash to face, bilaterally to arms, hands, and left heel. PT endorses concern for insect bites and possible allergic reaction to rx abx prescribed yesterday for UTI

## 2020-01-08 NOTE — ED Notes (Signed)
Review D/C papers with pt, reviewed Rx with pt, pt states understanding, pt denies questions at this time. 

## 2020-01-08 NOTE — ED Provider Notes (Signed)
[facility] EMERGENCY DEPARTMENT Provider Note   CSN: [id number] Arrival date & time: 01/08/20  5093     History Chief Complaint  Patient presents with  . Rash    Joanne Gonzalez is a 54 y.o. female.  HPI 54 year old female presents the ER for evaluation of rash.  Patient reports that she was seen yesterday in the emergency room for dysuria.  Patient was diagnosed with a urinary tract infection.  She was given 1 dose of Bactrim in the ER.  Patient reports that she woke this morning and she had several red dots on her arms and feet.  Patient reports that she is unsure if she was bitten by an insect or if she was having an allergic reaction to the antibiotic.  Patient states that she does have a history of hives to Keflex and penicillin.  Patient reports that the area is burning.  She believes that she was possibly stung by something to her left ankle.  Patient denies any lip swelling or tongue swelling.  She denies any difficulty swallowing.  Patient has tried no medications for symptoms prior to arrival.    Past Medical History:  Diagnosis Date  . CVA (cerebral infarction)   . Schizophrenia ([place])   . Seizures [facility])     Patient Active Problem List   Diagnosis Date Noted  . Stroke (cerebrum) ([place]) 02/10/2019  . Hypocalcemia 02/10/2019  . History of seizures 02/10/2019  . Homelessness 11/26/2018  . Adjustment disorder with depressed mood 11/26/2018  . Polysubstance abuse ([place])   . Iron deficiency anemia 09/01/2014  . Cocaine use 09/01/2014  . Hyperglycemia 09/01/2014  . Schizophrenia, paranoid ([place]) 08/31/2014  . Left-sided weakness 08/30/2014  . Hemiplegia, unspecified, affecting nondominant side 04/13/2013    Past Surgical History:  Procedure Laterality Date  . LIMB SPARING RESECTION HIP W/ SADDLE JOINT REPLACEMENT       OB History   No obstetric history on file.     Family History  Problem Relation Age of Onset  . Seizures Mother   . Hypertension  Mother     Social History   Tobacco Use  . Smoking status: Former Smoker    Packs/day: 0.00    Types: Cigarettes  . Smokeless tobacco: Never Used  Substance Use Topics  . Alcohol use: No    Comment: BAC not available  . Drug use: Yes    Types: Cocaine, Marijuana    Comment: UDS not available for assessment    Home Medications Prior to Admission medications   Medication Sig Start Date End Date Taking? Authorizing Provider  hydrocortisone cream 1 % Apply to affected area 2 times daily 01/08/20   Ocie Cornfield T, PA-C  nitrofurantoin, macrocrystal-monohydrate, (MACROBID) 100 MG capsule Take 1 capsule (100 mg total) by mouth 2 (two) times daily. 01/08/20   Doristine Devoid, PA-C  QUEtiapine (SEROQUEL) 100 MG tablet Take 1 tablet (100 mg total) by mouth at bedtime. 11/08/19   Money, Lowry Ram, FNP  sulfamethoxazole-trimethoprim (BACTRIM DS) 800-160 MG tablet Take 1 tablet by mouth 2 (two) times daily for 7 days. 01/07/20 01/14/20  Curatolo, Adam, DO  traZODone (DESYREL) 100 MG tablet Take 1 tablet (100 mg total) by mouth at bedtime. 11/08/19   Money, Lowry Ram, FNP  ferrous sulfate 325 (65 FE) MG tablet Take 1 tablet (325 mg total) by mouth daily with breakfast. Patient not taking: Reported on 03/14/2015 09/02/14 03/14/15  Luan Moore, MD  sertraline (ZOLOFT) 50 MG tablet Take 1 tablet (  50 mg total) by mouth daily. Patient not taking: Reported on 03/14/2015 09/02/14 03/14/15  Luan Moore, MD    Allergies    Keflex [cephalexin], Penicillins, and Lactose intolerance (gi)  Review of Systems   Review of Systems  Constitutional: Negative for chills and fever.  HENT: Negative for congestion and voice change.   Eyes: Negative for visual disturbance.  Respiratory: Negative for cough.   Gastrointestinal: Negative for nausea and vomiting.  Genitourinary: Negative for vaginal discharge.  Musculoskeletal: Negative for myalgias.  Skin: Positive for rash.  Neurological: Negative for  syncope.  Psychiatric/Behavioral: Negative for confusion.    Physical Exam Updated Vital Signs BP 135/83 (BP Location: Right Arm)   Pulse 75   Temp 98.3 F (36.8 C) (Oral)   Resp 18   Ht 5\' 5"  (1.651 m)   Wt 65.8 kg   SpO2 98%   BMI 24.13 kg/m   Physical Exam Vitals and nursing note reviewed.  Constitutional:      General: She is not in acute distress.    Appearance: She is well-developed. She is not ill-appearing or toxic-appearing.  HENT:     Head: Normocephalic and atraumatic.     Mouth/Throat:     Mouth: Mucous membranes are moist.     Pharynx: Oropharynx is clear.     Comments: Oropharynx is clear.  No angioedema of the tongue or lips.  Patient tolerating secretions and managing airway. Eyes:     General: No scleral icterus.       Right eye: No discharge.        Left eye: No discharge.  Pulmonary:     Effort: Pulmonary effort is normal. No respiratory distress.  Musculoskeletal:        General: Normal range of motion.     Cervical back: Normal range of motion.  Skin:    General: Skin is warm and dry.     Coloration: Skin is not pale.     Findings: Rash present.     Comments: Patient with several dime sized erythematous lesions to the forearm and 1 to the left ankle.  There is no warmth or fluctuance associated with the areas.  There is no vesicular lesions or drainage.  Neurological:     Mental Status: She is alert.  Psychiatric:        Behavior: Behavior normal.        Thought Content: Thought content normal.        Judgment: Judgment normal.     ED Results / Procedures / Treatments   Labs (all labs ordered are listed, but only abnormal results are displayed) Labs Reviewed - No data to display  EKG None  Radiology No results found.  Procedures Procedures (including critical care time)  Medications Ordered in ED Medications  metroNIDAZOLE (FLAGYL) tablet 2,000 mg (2,000 mg Oral Given 01/08/20 0919)    ED Course  I have reviewed the triage  vital signs and the nursing notes.  Pertinent labs & imaging results that were available during my care of the patient were reviewed by me and considered in my medical decision making (see chart for details).    MDM Rules/Calculators/A&P                          Patient presents the ER for evaluation of rash.  She is unsure if this is secondary to medication reaction versus insect bite.  Patient has no signs of angioedema or anaphylaxis at this  time.  Patient's chart was reviewed from ER visit yesterday.  She does appear to have a urinary tract infection.  Patient also was positive for trichomonas but not treated at that time.  Patient denies any vaginal discharge.  She reports that she has not been sexually active for the past 3 months.  Patient given dose of Flagyl in the ER today.  ER provider was not concerned about pyelonephritis yesterday.  Will treat patient with Macrobid and have patient stop the Bactrim.  Will give topical steroids for relief with rash.  No signs of secondary infection or abscess.  Patient is does not appear systemically ill.  She will need PCP follow-up in the outpatient setting.  Pt is hemodynamically stable, in NAD, & able to ambulate in the ED. Evaluation does not show pathology that would require ongoing emergent intervention or inpatient treatment. I explained the diagnosis to the patient. Pain has been managed & has no complaints prior to dc. Pt is comfortable with above plan and is stable for discharge at this time. All questions were answered prior to disposition. Strict return precautions for f/u to the ED were discussed. Encouraged follow up with PCP.  Final Clinical Impression(s) / ED Diagnoses Final diagnoses:  Rash    Rx / DC Orders ED Discharge Orders         Ordered    nitrofurantoin, macrocrystal-monohydrate, (MACROBID) 100 MG capsule  2 times daily        01/08/20 0916    hydrocortisone cream 1 %        01/08/20 0916           Doristine Devoid, PA-C 01/08/20 3846    Quintella Reichert, MD 01/08/20 1116

## 2020-01-09 LAB — URINE CULTURE: Culture: >=100000 — AB

## 2020-01-10 ENCOUNTER — Telehealth: Payer: Self-pay | Admitting: *Deleted

## 2020-01-10 NOTE — Telephone Encounter (Signed)
Post ED Visit - Positive Culture Follow-up  Culture report reviewed by antimicrobial stewardship pharmacist: [place] Team []  Elenor Quinones, Pharm.D. []  Heide Guile, Pharm.D., BCPS AQ-ID []  Parks Neptune, Pharm.D., BCPS []  Alycia Rossetti, Pharm.D., BCPS []  [place], Pharm.D., BCPS, AAHIVP []  Legrand Como, Pharm.D., BCPS, AAHIVP []  Salome Arnt, PharmD, BCPS []  Johnnette Gourd, PharmD, BCPS []  Hughes Better, PharmD, BCPS []  Leeroy Cha, PharmD []  Laqueta Linden, PharmD, BCPS []  Albertina Parr, PharmD  [place] Team []  Leodis Sias, PharmD []  Lindell Spar, PharmD []  Royetta Asal, PharmD []  Graylin Shiver, Rph []  Rema Fendt) Glennon Mac, PharmD []  Arlyn Dunning, PharmD []  Netta Cedars, PharmD []  Dia Sitter, PharmD []  Leone Haven, PharmD []  Gretta Arab, PharmD []  Theodis Shove, PharmD []  Peggyann Juba, PharmD []  Reuel Boom, PharmD   Positive urine culture Treated with Sulfamethoxazole-Trimethoprim, organism sensitive to the same and no further patient follow-up is required at this time. Alfredia Client, PA-C  Harlon Flor Talley 01/10/2020, 11:56 AM

## 2020-01-30 ENCOUNTER — Emergency Department (HOSPITAL_COMMUNITY): Payer: Medicaid Other

## 2020-01-30 ENCOUNTER — Emergency Department (HOSPITAL_COMMUNITY)
Admission: EM | Admit: 2020-01-30 | Discharge: 2020-01-30 | Disposition: A | Discharge status: Home / Self Care | Payer: Medicaid Other | Attending: Emergency Medicine | Admitting: Emergency Medicine

## 2020-01-30 ENCOUNTER — Encounter (HOSPITAL_COMMUNITY): Payer: Self-pay | Admitting: Emergency Medicine

## 2020-01-30 DIAGNOSIS — R202 Paresthesia of skin: Secondary | ICD-10-CM | POA: Insufficient documentation

## 2020-01-30 DIAGNOSIS — R4781 Slurred speech: Secondary | ICD-10-CM | POA: Insufficient documentation

## 2020-01-30 DIAGNOSIS — F444 Conversion disorder with motor symptom or deficit: Secondary | ICD-10-CM

## 2020-01-30 DIAGNOSIS — F1490 Cocaine use, unspecified, uncomplicated: Secondary | ICD-10-CM | POA: Insufficient documentation

## 2020-01-30 DIAGNOSIS — Z8673 Personal history of transient ischemic attack (TIA), and cerebral infarction without residual deficits: Secondary | ICD-10-CM | POA: Insufficient documentation

## 2020-01-30 DIAGNOSIS — R2990 Unspecified symptoms and signs involving the nervous system: Secondary | ICD-10-CM

## 2020-01-30 DIAGNOSIS — R29818 Other symptoms and signs involving the nervous system: Secondary | ICD-10-CM | POA: Diagnosis not present

## 2020-01-30 DIAGNOSIS — Z87891 Personal history of nicotine dependence: Secondary | ICD-10-CM | POA: Diagnosis not present

## 2020-01-30 DIAGNOSIS — G969 Disorder of central nervous system, unspecified: Secondary | ICD-10-CM | POA: Diagnosis present

## 2020-01-30 DIAGNOSIS — R531 Weakness: Secondary | ICD-10-CM | POA: Diagnosis not present

## 2020-01-30 LAB — PROTIME-INR
INR: 1 (ref 0.8–1.2)
Prothrombin Time: 13.1 seconds (ref 11.4–15.2)

## 2020-01-30 LAB — CBC
HCT: 35.1 % — ABNORMAL LOW (ref 36.0–46.0)
Hemoglobin: 11.1 g/dL — ABNORMAL LOW (ref 12.0–15.0)
MCH: 25.6 pg — ABNORMAL LOW (ref 26.0–34.0)
MCHC: 31.6 g/dL (ref 30.0–36.0)
MCV: 80.9 fL (ref 80.0–100.0)
Platelets: 423 10*3/uL — ABNORMAL HIGH (ref 150–400)
RBC: 4.34 MIL/uL (ref 3.87–5.11)
RDW: 16.2 % — ABNORMAL HIGH (ref 11.5–15.5)
WBC: 10.6 10*3/uL — ABNORMAL HIGH (ref 4.0–10.5)
nRBC: 0 % (ref 0.0–0.2)

## 2020-01-30 LAB — DIFFERENTIAL
Abs Immature Granulocytes: 0.04 10*3/uL (ref 0.00–0.07)
Basophils Absolute: 0.1 10*3/uL (ref 0.0–0.1)
Basophils Relative: 1 %
Eosinophils Absolute: 0.2 10*3/uL (ref 0.0–0.5)
Eosinophils Relative: 2 %
Immature Granulocytes: 0 %
Lymphocytes Relative: 21 %
Lymphs Abs: 2.2 10*3/uL (ref 0.7–4.0)
Monocytes Absolute: 1.2 10*3/uL — ABNORMAL HIGH (ref 0.1–1.0)
Monocytes Relative: 11 %
Neutro Abs: 7 10*3/uL (ref 1.7–7.7)
Neutrophils Relative %: 65 %

## 2020-01-30 LAB — COMPREHENSIVE METABOLIC PANEL
ALT: 17 U/L (ref 0–44)
AST: 20 U/L (ref 15–41)
Albumin: 3.9 g/dL (ref 3.5–5.0)
Alkaline Phosphatase: 80 U/L (ref 38–126)
Anion gap: 12 (ref 5–15)
BUN: 8 mg/dL (ref 6–20)
CO2: 21 mmol/L — ABNORMAL LOW (ref 22–32)
Calcium: 9.4 mg/dL (ref 8.9–10.3)
Chloride: 103 mmol/L (ref 98–111)
Creatinine, Ser: 0.85 mg/dL (ref 0.44–1.00)
GFR, Estimated: >60 mL/min (ref >60)
Glucose, Bld: 114 mg/dL — ABNORMAL HIGH (ref 70–99)
Potassium: 3.6 mmol/L (ref 3.5–5.1)
Sodium: 136 mmol/L (ref 135–145)
Total Bilirubin: 0.8 mg/dL (ref 0.3–1.2)
Total Protein: 8.1 g/dL (ref 6.5–8.1)

## 2020-01-30 LAB — I-STAT CHEM 8, ED
BUN: 6 mg/dL (ref 6–20)
Calcium, Ion: 1.08 mmol/L — ABNORMAL LOW (ref 1.15–1.40)
Chloride: 106 mmol/L (ref 98–111)
Creatinine, Ser: 0.8 mg/dL (ref 0.44–1.00)
Glucose, Bld: 113 mg/dL — ABNORMAL HIGH (ref 70–99)
HCT: 35 % — ABNORMAL LOW (ref 36.0–46.0)
Hemoglobin: 11.9 g/dL — ABNORMAL LOW (ref 12.0–15.0)
Potassium: 3.5 mmol/L (ref 3.5–5.1)
Sodium: 138 mmol/L (ref 135–145)
TCO2: 23 mmol/L (ref 22–32)

## 2020-01-30 LAB — CBG MONITORING, ED: Glucose-Capillary: 110 mg/dL — ABNORMAL HIGH (ref 70–99)

## 2020-01-30 LAB — I-STAT BETA HCG BLOOD, ED (MC, WL, AP ONLY): I-stat hCG, quantitative: <5 m[IU]/mL (ref <5)

## 2020-01-30 LAB — APTT: aPTT: 28 seconds (ref 24–36)

## 2020-01-30 LAB — ETHANOL: Alcohol, Ethyl (B): <10 mg/dL (ref <10)

## 2020-01-30 MED ORDER — SODIUM CHLORIDE 0.9% FLUSH: 3.0000 mL | Freq: Once | INTRAVENOUS | Status: DC

## 2020-01-30 MED ORDER — SODIUM CHLORIDE 0.9 % IV BOLUS
1000.0000 mL | Freq: Once | INTRAVENOUS | Status: AC
  Administered 2020-01-30: 1000 mL via INTRAVENOUS

## 2020-01-30 MED ORDER — LORAZEPAM 2 MG/ML IJ SOLN
2.0000 mg | Freq: Once | INTRAMUSCULAR | Status: AC
  Administered 2020-01-30: 2 mg via INTRAVENOUS

## 2020-01-30 MED ORDER — LORAZEPAM 2 MG/ML IJ SOLN
INTRAMUSCULAR | Status: AC
  Filled 2020-01-30: qty 1
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
## 2020-01-30 NOTE — ED Notes (Signed)
Attempted to discharge pt.  She is awaiting SW.

## 2020-01-30 NOTE — ED Notes (Signed)
Per Case Management, pt can stay in the lobby overnight until she will be bussed to [facility].  [facility] SW will contact transport to take pt to the bus station as well.  However, if pt walks out of the [facility] doors, she will no longer be offered the ticket.  She is waiting for someone to bring her her belongings.

## 2020-01-30 NOTE — Discharge Instructions (Signed)
[facility] [address], [place], [place] [address] [phone] Main [phone] [address] [place], [place] [address] [phone] Direct   [facility] [place]  [place] [address] [place], [place] [address] [facility] [address] [place], [place]. [place] Telephone: [phone]

## 2020-01-30 NOTE — ED Notes (Signed)
SW at bedside.

## 2020-01-30 NOTE — ED Notes (Signed)
UDS and UA accidentally clicked "collected".  No sample was sent.

## 2020-01-30 NOTE — Progress Notes (Signed)
CSW spoke with Dr. Ronnald Nian who states this patient is homeless and has a history of substance abuse. CSW added resources to the patient's AVS for homelessness and mental health services.  Madilyn Fireman, MSW, LCSW-A Transitions of Care  Clinical Social Worker  [facility] Emergency Departments  Medical ICU [phone]
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
## 2020-01-30 NOTE — ED Triage Notes (Signed)
Pt here from home as a  Code stroke , lsn was sometime last night unable to get a specific time , pt comes to the Ed with left side weakness and slurred speech
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
## 2020-01-31 NOTE — Progress Notes (Signed)
CSW found patient in lobby - two copies of [employer] ticket given, along with a copy of her facesheet to use as identification if needed.  CSW scheduled a ride for patient to discharge to the train station via [employer] - patient was given information about her driver and the vehicle.  Madilyn Fireman, MSW, LCSW-A Transitions of Care  Clinical Social Worker  [facility] Emergency Departments  Medical ICU [phone]

## 2020-08-15 ENCOUNTER — Encounter (HOSPITAL_COMMUNITY): Payer: Self-pay | Admitting: Emergency Medicine

## 2020-08-15 ENCOUNTER — Emergency Department (HOSPITAL_COMMUNITY)
Admission: EM | Admit: 2020-08-15 | Discharge: 2020-08-16 | Disposition: A | Discharge status: Other Acute Inpatient Hospital | Payer: Medicaid Other | Attending: Emergency Medicine | Admitting: Emergency Medicine

## 2020-08-15 ENCOUNTER — Other Ambulatory Visit: Payer: Self-pay

## 2020-08-15 DIAGNOSIS — F1290 Cannabis use, unspecified, uncomplicated: Secondary | ICD-10-CM | POA: Insufficient documentation

## 2020-08-15 DIAGNOSIS — Z5900 Homelessness unspecified: Secondary | ICD-10-CM | POA: Insufficient documentation

## 2020-08-15 DIAGNOSIS — Z87891 Personal history of nicotine dependence: Secondary | ICD-10-CM | POA: Diagnosis not present

## 2020-08-15 DIAGNOSIS — F209 Schizophrenia, unspecified: Secondary | ICD-10-CM | POA: Diagnosis not present

## 2020-08-15 DIAGNOSIS — Z20822 Contact with and (suspected) exposure to covid-19: Secondary | ICD-10-CM | POA: Diagnosis not present

## 2020-08-15 DIAGNOSIS — F600 Paranoid personality disorder: Secondary | ICD-10-CM | POA: Diagnosis not present

## 2020-08-15 DIAGNOSIS — F32A Depression, unspecified: Secondary | ICD-10-CM | POA: Diagnosis present

## 2020-08-15 DIAGNOSIS — F1490 Cocaine use, unspecified, uncomplicated: Secondary | ICD-10-CM | POA: Insufficient documentation

## 2020-08-15 DIAGNOSIS — Z9114 Patient's other noncompliance with medication regimen: Secondary | ICD-10-CM | POA: Insufficient documentation

## 2020-08-15 DIAGNOSIS — R45851 Suicidal ideations: Secondary | ICD-10-CM | POA: Diagnosis not present

## 2020-08-15 DIAGNOSIS — Z8659 Personal history of other mental and behavioral disorders: Secondary | ICD-10-CM | POA: Diagnosis not present

## 2020-08-15 LAB — CBC
HCT: 31.4 % — ABNORMAL LOW (ref 36.0–46.0)
Hemoglobin: 9.2 g/dL — ABNORMAL LOW (ref 12.0–15.0)
MCH: 23.1 pg — ABNORMAL LOW (ref 26.0–34.0)
MCHC: 29.3 g/dL — ABNORMAL LOW (ref 30.0–36.0)
MCV: 78.9 fL — ABNORMAL LOW (ref 80.0–100.0)
Platelets: 273 10*3/uL (ref 150–400)
RBC: 3.98 MIL/uL (ref 3.87–5.11)
RDW: 16.3 % — ABNORMAL HIGH (ref 11.5–15.5)
WBC: 9.9 10*3/uL (ref 4.0–10.5)
nRBC: 0 % (ref 0.0–0.2)

## 2020-08-15 LAB — COMPREHENSIVE METABOLIC PANEL
ALT: 19 U/L (ref 0–44)
AST: 26 U/L (ref 15–41)
Albumin: 4.4 g/dL (ref 3.5–5.0)
Alkaline Phosphatase: 86 U/L (ref 38–126)
Anion gap: 12 (ref 5–15)
BUN: 8 mg/dL (ref 6–20)
CO2: 20 mmol/L — ABNORMAL LOW (ref 22–32)
Calcium: 9.7 mg/dL (ref 8.9–10.3)
Chloride: 103 mmol/L (ref 98–111)
Creatinine, Ser: 0.88 mg/dL (ref 0.44–1.00)
GFR, Estimated: >60 mL/min (ref >60)
Glucose, Bld: 93 mg/dL (ref 70–99)
Potassium: 3.7 mmol/L (ref 3.5–5.1)
Sodium: 135 mmol/L (ref 135–145)
Total Bilirubin: 0.1 mg/dL — ABNORMAL LOW (ref 0.3–1.2)
Total Protein: 8.5 g/dL — ABNORMAL HIGH (ref 6.5–8.1)

## 2020-08-15 LAB — RAPID URINE DRUG SCREEN, HOSP PERFORMED
Amphetamines: NOT DETECTED
Barbiturates: NOT DETECTED
Benzodiazepines: NOT DETECTED
Cocaine: POSITIVE — AB
Opiates: NOT DETECTED
Tetrahydrocannabinol: NOT DETECTED

## 2020-08-15 LAB — ETHANOL: Alcohol, Ethyl (B): <10 mg/dL (ref <10)

## 2020-08-15 LAB — ACETAMINOPHEN LEVEL: Acetaminophen (Tylenol), Serum: <10 ug/mL — ABNORMAL LOW (ref 10–30)

## 2020-08-15 LAB — SALICYLATE LEVEL: Salicylate Lvl: <7 mg/dL — ABNORMAL LOW (ref 7.0–30.0)

## 2020-08-15 LAB — I-STAT BETA HCG BLOOD, ED (MC, WL, AP ONLY): I-stat hCG, quantitative: <5 m[IU]/mL (ref <5)

## 2020-08-15 MED ORDER — TRAZODONE HCL 50 MG PO TABS
100.0000 mg | ORAL_TABLET | Freq: Every day | ORAL | Status: DC
  Administered 2020-08-16: 100 mg via ORAL
  Filled 2020-08-15: qty 2

## 2020-08-15 MED ORDER — QUETIAPINE FUMARATE 100 MG PO TABS
100.0000 mg | ORAL_TABLET | Freq: Every day | ORAL | Status: DC
  Administered 2020-08-16: 100 mg via ORAL
  Filled 2020-08-15 (×2): qty 1

## 2020-08-15 NOTE — ED Triage Notes (Signed)
Patient reports suicidal ideation but did not disclose her plan , no hallucinations , she has not taken her antipsychotic medications for several months .

## 2020-08-15 NOTE — Consult Note (Signed)
  Patient assessed by TTS counselor. Reportedly suicidal and paranoid at this time. She does endorse relapse on crack cocaine, last use on yesterday. Inpatient treatment recommended at this time. Patient requests home medications be restarted. Patient reviewed with Dr. Serafina Mitchell. Medications: -Quetiapine 100 mg QHS/ mood -Trazodone 100 mg QHS/ sleep

## 2020-08-15 NOTE — ED Provider Notes (Signed)
[place] EMERGENCY DEPARTMENT Provider Note   CSN: [id number] Arrival date & time: 08/15/20  0245     History Chief Complaint  Patient presents with  . Suicidal    Joanne Gonzalez is a 55 y.o. female.  Presented to the ER with concern for suicidal ideation.  Patient states that she has a history of past depression, but does not take any medications currently.  She denies having specific plan for how she would hurt herself.  Denies seeing or hearing things.  Is interested in getting help.  HPI     Past Medical History:  Diagnosis Date  . CVA (cerebral infarction)   . Schizophrenia ([place])   . Seizures [facility])     Patient Active Problem List   Diagnosis Date Noted  . Stroke (cerebrum) ([place]) 02/10/2019  . Hypocalcemia 02/10/2019  . History of seizures 02/10/2019  . Homelessness 11/26/2018  . Adjustment disorder with depressed mood 11/26/2018  . Polysubstance abuse ([place])   . Iron deficiency anemia 09/01/2014  . Cocaine use 09/01/2014  . Hyperglycemia 09/01/2014  . Schizophrenia, paranoid ([place]) 08/31/2014  . Left-sided weakness 08/30/2014  . Hemiplegia, unspecified, affecting nondominant side 04/13/2013    Past Surgical History:  Procedure Laterality Date  . LIMB SPARING RESECTION HIP W/ SADDLE JOINT REPLACEMENT       OB History   No obstetric history on file.     Family History  Problem Relation Age of Onset  . Seizures Mother   . Hypertension Mother     Social History   Tobacco Use  . Smoking status: Former Smoker    Packs/day: 0.00    Types: Cigarettes  . Smokeless tobacco: Never Used  Substance Use Topics  . Alcohol use: No    Comment: BAC not available  . Drug use: Yes    Types: Cocaine, Marijuana    Comment: UDS not available for assessment    Home Medications Prior to Admission medications   Medication Sig Start Date End Date Taking? Authorizing Provider  hydrocortisone cream 1 % Apply to affected area 2 times  daily Patient not taking: Reported on 01/30/2020 01/08/20   Ocie Cornfield T, PA-C  nitrofurantoin, macrocrystal-monohydrate, (MACROBID) 100 MG capsule Take 1 capsule (100 mg total) by mouth 2 (two) times daily. Patient not taking: Reported on 01/30/2020 01/08/20   Ocie Cornfield T, PA-C  QUEtiapine (SEROQUEL) 100 MG tablet Take 1 tablet (100 mg total) by mouth at bedtime. Patient not taking: Reported on 01/30/2020 11/08/19   Money, Lowry Ram, FNP  traZODone (DESYREL) 100 MG tablet Take 1 tablet (100 mg total) by mouth at bedtime. Patient not taking: Reported on 01/30/2020 11/08/19   Money, Lowry Ram, FNP  ferrous sulfate 325 (65 FE) MG tablet Take 1 tablet (325 mg total) by mouth daily with breakfast. Patient not taking: Reported on 03/14/2015 09/02/14 03/14/15  Luan Moore, MD  sertraline (ZOLOFT) 50 MG tablet Take 1 tablet (50 mg total) by mouth daily. Patient not taking: Reported on 03/14/2015 09/02/14 03/14/15  Luan Moore, MD    Allergies    Keflex [cephalexin], Penicillins, Bactrim [sulfamethoxazole-trimethoprim], and Lactose intolerance (gi)  Review of Systems   Review of Systems  Constitutional: Negative for chills and fever.  HENT: Negative for ear pain and sore throat.   Eyes: Negative for pain and visual disturbance.  Respiratory: Negative for cough and shortness of breath.   Cardiovascular: Negative for chest pain and palpitations.  Gastrointestinal: Negative for abdominal pain and vomiting.  Genitourinary: Negative  for dysuria and hematuria.  Musculoskeletal: Negative for arthralgias and back pain.  Skin: Negative for color change and rash.  Neurological: Negative for seizures and syncope.  All other systems reviewed and are negative.    Physical Exam Updated Vital Signs BP 108/67 (BP Location: Left Arm)   Pulse 85   Temp 98 F (36.7 C) (Oral)   Resp 18   SpO2 98%   Physical Exam Vitals and nursing note reviewed.  Constitutional:      General: She is not in  acute distress.    Appearance: She is well-developed.  HENT:     Head: Normocephalic and atraumatic.  Eyes:     Conjunctiva/sclera: Conjunctivae normal.  Cardiovascular:     Rate and Rhythm: Normal rate and regular rhythm.     Heart sounds: No murmur heard.   Pulmonary:     Effort: Pulmonary effort is normal. No respiratory distress.     Breath sounds: Normal breath sounds.  Abdominal:     Palpations: Abdomen is soft.     Tenderness: There is no abdominal tenderness.  Musculoskeletal:     Cervical back: Neck supple.  Skin:    General: Skin is warm and dry.  Neurological:     General: No focal deficit present.     Mental Status: She is alert.  Psychiatric:     Comments: Flattened affect     ED Results / Procedures / Treatments   Labs (all labs ordered are listed, but only abnormal results are displayed) Labs Reviewed  COMPREHENSIVE METABOLIC PANEL - Abnormal; Notable for the following components:      Result Value   CO2 20 (*)    Total Protein 8.5 (*)    Total Bilirubin 0.1 (*)    All other components within normal limits  SALICYLATE LEVEL - Abnormal; Notable for the following components:   Salicylate Lvl <9.6 (*)    All other components within normal limits  ACETAMINOPHEN LEVEL - Abnormal; Notable for the following components:   Acetaminophen (Tylenol), Serum <10 (*)    All other components within normal limits  CBC - Abnormal; Notable for the following components:   Hemoglobin 9.2 (*)    HCT 31.4 (*)    MCV 78.9 (*)    MCH 23.1 (*)    MCHC 29.3 (*)    RDW 16.3 (*)    All other components within normal limits  RAPID URINE DRUG SCREEN, HOSP PERFORMED - Abnormal; Notable for the following components:   Cocaine POSITIVE (*)    All other components within normal limits  ETHANOL  I-STAT BETA HCG BLOOD, ED ([facility], [facility], [facility] ONLY)    EKG None  Radiology No results found.  Procedures Procedures   Medications Ordered in ED Medications - No data to display  ED  Course  I have reviewed the triage vital signs and the nursing notes.  Pertinent labs & imaging results that were available during my care of the patient were reviewed by me and considered in my medical decision making (see chart for details).    MDM Rules/Calculators/A&P                         55 year old lady presents to ER with concern for suicidal thoughts.  Patient appears well.  Her labs are stable.  She is medically cleared for psychiatric evaluation.  I have placed TTS order.  At time of signout, TTS evaluation pending.  Dispo per psych.  Final Clinical  Impression(s) / ED Diagnoses Final diagnoses:  Suicidal ideation    Rx / DC Orders ED Discharge Orders    None       Lucrezia Starch, MD 08/15/20 [phone]

## 2020-08-15 NOTE — ED Notes (Addendum)
Pts belongings inventoried and placed in locker 3.  

## 2020-08-15 NOTE — BH Assessment (Addendum)
Per Dr. Roslynn Amble patient is medically clear.
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
## 2020-08-15 NOTE — ED Notes (Signed)
Pt placed in room for TTS assessment.

## 2020-08-16 ENCOUNTER — Ambulatory Visit (HOSPITAL_COMMUNITY)
Admission: EM | Admit: 2020-08-16 | Discharge: 2020-08-16 | Disposition: A | Discharge status: Home / Self Care | Payer: Medicaid Other | Source: Home / Self Care

## 2020-08-16 DIAGNOSIS — F209 Schizophrenia, unspecified: Secondary | ICD-10-CM | POA: Insufficient documentation

## 2020-08-16 DIAGNOSIS — Z8659 Personal history of other mental and behavioral disorders: Secondary | ICD-10-CM | POA: Insufficient documentation

## 2020-08-16 DIAGNOSIS — R45851 Suicidal ideations: Secondary | ICD-10-CM | POA: Insufficient documentation

## 2020-08-16 DIAGNOSIS — Z87891 Personal history of nicotine dependence: Secondary | ICD-10-CM | POA: Insufficient documentation

## 2020-08-16 DIAGNOSIS — F1490 Cocaine use, unspecified, uncomplicated: Secondary | ICD-10-CM | POA: Insufficient documentation

## 2020-08-16 DIAGNOSIS — Z5900 Homelessness unspecified: Secondary | ICD-10-CM | POA: Insufficient documentation

## 2020-08-16 DIAGNOSIS — F600 Paranoid personality disorder: Secondary | ICD-10-CM | POA: Insufficient documentation

## 2020-08-16 DIAGNOSIS — Z9114 Patient's other noncompliance with medication regimen: Secondary | ICD-10-CM | POA: Insufficient documentation

## 2020-08-16 DIAGNOSIS — F1290 Cannabis use, unspecified, uncomplicated: Secondary | ICD-10-CM | POA: Insufficient documentation

## 2020-08-16 LAB — LIPID PANEL
Cholesterol: 196 mg/dL (ref 0–200)
HDL: 58 mg/dL (ref >40)
LDL Cholesterol: 111 mg/dL — ABNORMAL HIGH (ref 0–99)
Total CHOL/HDL Ratio: 3.4 RATIO
Triglycerides: 137 mg/dL (ref <150)
VLDL: 27 mg/dL (ref 0–40)

## 2020-08-16 LAB — RESP PANEL BY RT-PCR (FLU A&B, COVID) ARPGX2
Influenza A by PCR: NEGATIVE
Influenza B by PCR: NEGATIVE
SARS Coronavirus 2 by RT PCR: NEGATIVE

## 2020-08-16 LAB — HEMOGLOBIN A1C
Hgb A1c MFr Bld: 6.2 % — ABNORMAL HIGH (ref 4.8–5.6)
Mean Plasma Glucose: 131.24 mg/dL

## 2020-08-16 LAB — TSH: TSH: 0.969 u[IU]/mL (ref 0.350–4.500)

## 2020-08-16 MED ORDER — QUETIAPINE FUMARATE 100 MG PO TABS
100.0000 mg | ORAL_TABLET | Freq: Every day | ORAL | 0 refills | Status: DC
Start: 2020-08-16 — End: 2020-09-10

## 2020-08-16 MED ORDER — ACETAMINOPHEN 325 MG PO TABS: 650.0000 mg | ORAL_TABLET | Freq: Four times a day (QID) | ORAL | Status: DC | PRN

## 2020-08-16 MED ORDER — MAGNESIUM HYDROXIDE 400 MG/5ML PO SUSP: 30.0000 mL | Freq: Every day | ORAL | Status: DC | PRN

## 2020-08-16 MED ORDER — ALUM & MAG HYDROXIDE-SIMETH 200-200-20 MG/5ML PO SUSP: 30.0000 mL | ORAL | Status: DC | PRN

## 2020-08-16 MED ORDER — HYDROXYZINE HCL 25 MG PO TABS: 25.0000 mg | ORAL_TABLET | Freq: Three times a day (TID) | ORAL | Status: DC | PRN

## 2020-08-16 MED ORDER — TRAZODONE HCL 100 MG PO TABS: 100.0000 mg | ORAL_TABLET | Freq: Every day | ORAL | Status: DC

## 2020-08-16 MED ORDER — TRAZODONE HCL 100 MG PO TABS
100.0000 mg | ORAL_TABLET | Freq: Every day | ORAL | 0 refills | Status: DC
Start: 2020-08-16 — End: 2020-09-10

## 2020-08-16 MED ORDER — QUETIAPINE FUMARATE 100 MG PO TABS: 100.0000 mg | ORAL_TABLET | Freq: Every day | ORAL | Status: DC

## 2020-08-16 NOTE — ED Notes (Signed)
Patient denies pain and is resting comfortably.  

## 2020-08-16 NOTE — Progress Notes (Signed)
Pt was given a diet gingerale and potato chips

## 2020-08-16 NOTE — Progress Notes (Signed)
Joanne Gonzalez was discharged and waited for her friend in the lobby after retrieving her personal belongings.

## 2020-08-16 NOTE — ED Notes (Signed)
AVS provided and explained, Pt stated understanding.
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
## 2020-08-16 NOTE — ED Notes (Signed)
Pt ate a salad and drank a ginger ale. Pt is now lying in bed quietly, breathing unlabored, environment checked and secured, will continue to monitor patient.

## 2020-08-16 NOTE — ED Notes (Signed)
Patient arrived on unit given meal went to sleep
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
## 2020-08-16 NOTE — Discharge Instructions (Signed)
Patient is instructed prior to discharge to:  Take all medications as prescribed by his/her mental healthcare provider. Report any adverse effects and or reactions from the medicines to his/her outpatient provider promptly. Keep all scheduled appointments, to ensure that you are getting refills on time and to avoid any interruption in your medication.  If you are unable to keep an appointment call to reschedule.  Be sure to follow-up with resources and follow-up appointments provided.  Patient has been instructed & cautioned: To not engage in alcohol and or illegal drug use while on prescription medicines. In the event of worsening symptoms, patient is instructed to call the crisis hotline, 911 and or go to the nearest ED for appropriate evaluation and treatment of symptoms. To follow-up with his/her primary care provider for your other medical issues, concerns and or health care needs.    Homeless Shelter List:     Regulatory affairs officer ([place])  [place], [place]  Phone: [phone]     [facility]  [address], [place], [place] [address]  Phone: [phone]     [facility] (Women only)  [address] [place], [place] [address]  Phone: [place]. [place], [place] [address]  Phone: [phone]     [place]:  [phone]. [address]  [place], [place] [address]  Phone: [phone]     [facility]  [address], [place], [place] [address]  (Check in at 6:00PM for placement at a local shelter)  Phone: [phone]   Substance Abuse Treatment Resources listed Below:  [facility] - Admissions are currently completed Monday through Friday at [place]; both appointments and walk-ins are accepted.  Any individual that is a [facility] resident may present for  a substance abuse screening and assessment for admission.  A person may be referred by numerous sources or self-refer.   Potential clients will be screened for medical necessity and appropriateness for the program.  Clients must meet criteria for high-intensity residential treatment services.  If clinically appropriate, a client will continue with the comprehensive clinical assessment and intake process, as well as enrollment in the [place].  Address: [address] [place], [place] [address] Admin Hours: [place] to [place] Hours: 24/7 Phone: [phone] Fax: [place] Address: [place], [place], [place] [address] [facility]) Hours: 24/7 Phone: [phone] Fax: [phone]  [facility] ([facility]): (offers outpatient therapy and intensive outpatient substance abuse therapy).  [address], [place], [place] [address] Phone: [phone]  [facility]: Offers FREE recovery skills classes, support groups, 1:1 Peer Support, and Compeer Classes. [address] [place], [place] [address] Phone: [phone] (Call to complete intake).   [facility] [place], [place] [address] Phone: [phone] ext [phone] The [facility] provides food, shelter and other programs and services to the homeless men of [place] through our [employer] program.  By offering safe shelter, three meals a day, clean clothing, Biblical counseling, financial planning, vocational training, GED/education and employment assistance, we've helped mend the shattered lives of many homeless men since opening in 1974.  We have approximately 267 beds available, with a max of 312 beds including mats for emergency situations and currently house an average of  270 men a night.  Prospective Client Check-In Information Photo ID Required (State/ Out of State/  [facility]) - if photo ID is not available, clients are required to have a printout of a police/sheriff's criminal history report. Help out with chores around the [place]. No sex offender of any type (pending, charged, registered and/or any other sex related offenses) will be permitted to check in. Must be willing to abide by all rules, regulations, and policies established by the [employer]. The following will be provided - shelter, food, clothing, and biblical counseling. If you or someone you know is in need of assistance at our [facility] shelter in [place], [place], please call [phone] ext. [phone].  [facility]-will provide timely access to mental health services for children and adolescents (4-17) and adults presenting in a mental health crisis. The program is designed for those who need urgent Behavioral Health or Substance Use treatment and are not experiencing a medical crisis that would typically require an emergency room visit.    [place], [place] [address] Phone: [phone] [email or web address]: Phone#: [phone]  The [facility] SA Intensive Outpatient Program (SAIOP) means structured individual and group addiction activities and services that are provided at an outpatient program designed to assist adult and adolescent consumers to begin recovery and learn skills for recovery maintenance. The [place] program is offered at least 3 hours a day, 3 days a week.SAIOP services shall include a structured program consisting of, but not limited to, the following services: Individual counseling and support; Group counseling and support; Family counseling, training or support; Biochemical assays to identify recent drug use (e.g., urine drug screens); Strategies for relapse prevention to include community and social support systems in treatment; Life skills; Crisis contingency planning;  Disease Management; and Treatment support activities that have been adapted or specifically designed for persons with physical disabilities, or persons with co-occurring disorders of mental illness and substance abuse/dependence or mental retardation/developmental disability and substance abuse/dependence. Phone: [phone]  Address:   [facility] will also offer the following outpatient services: (Monday through Friday 8am-5pm)    Partial Hospitalization Program (PHP)  Substance Abuse Intensive Outpatient Program (SA-IOP)  Group Therapy  Medication Management  Peer Living Room We also provide (24/7):  Assessments: Our mental health clinician and providers will conduct a focused mental health evaluation, assessing for immediate safety concerns and further mental health needs. Referral: Our team will provide resources and help connect to community based mental health treatment, when indicated, including psychotherapy, psychiatry, and other specialized behavioral health or substance use disorder services (for those not already in treatment). Transitional Care: Our team providers in person bridging and/or telephonic follow-up during the patient's transition to outpatient services.  [facility] 24-Hour Call Center: [phone] Behavioral Health Crisis Line: [phone]  Domestic Violence Crisis  [facility] of the [place] -- [phone] [place] -- [phone] [facility] shelter program provides temporary emergency shelter to individuals and families escaping violent homes. Family Service manages two shelters, [facility] in [place] and [facility] in [place]. More than just a safe place to stay, our shelters provide a stable, supportive environment in which victims and their families can begin to rebuild their lives. Services offered at the shelters include: . Crisis Line: [facility] [place] operates a 24-hour crisis line for victims of domestic violence, sexual assault, rape or other violent  crimes. . Counseling Services: Each resident of our shelter is offered the opportunity to meet with a therapist to provide individual counseling or group therapy. Child play-therapy groups and parenting classes are also available. Mel[place] Rattan Advocacy: Our large staff of victim advocates provide clients with help attaining restraining orders (50Bs), going to court and legal referrals. . Case Management: Case managers are available to meet routinely with shelter residents to set goals and meet identified needs. These may include qualifying for food stamps, finding a job, applying for public housing and locating childcare. . Continuation of Care: When a person prepares to leave the shelter, staff can help them find housing and donated furniture, and prepare for life on their own. Once a family leaves the shelter, they are welcome to return to [facility] for continued individual or group counseling. Marland Kitchen   [facility] [facility]:   Phone: [phone]  [facility]  Address: [place], [place], [place] [address] Hours:  Closed ? [place] Phone: [phone] [facility]) is a "one stop shop" for victims of domestic violence, sexual assault, child abuse, and elder abuse. At each of our [facility] locations, professionals from 15 different disciplines work together to provide consolidated and Arts development officer, legal, social, and health services to individuals and families in need. At the Centers, victims of domestic and sexual violence can come to one location to access a wide range of supportive resources, such as talking with a victim advocate, getting assistance with filing a restraining order, planning for their safety, talking to a Curator, meeting with a professional to discuss civil and criminal  legal issues, receiving medical assistance, and gaining information on how to access shelter and other community resources. The [place] also is a Theatre manager for [employer] and education and works with organizations and volunteers throughout the [place].  If you are in immediate danger, please call 911, or [facility]'s 24 hour Crisis Hotline at ([phone])430-9884. [facility] [place]  Address: [address], [place], [place] [address] Hours:  Closed ? [place] Phone: [phone]
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
## 2020-09-10 ENCOUNTER — Emergency Department (HOSPITAL_COMMUNITY): Payer: Medicaid Other

## 2020-09-10 ENCOUNTER — Emergency Department (HOSPITAL_COMMUNITY)
Admission: EM | Admit: 2020-09-10 | Discharge: 2020-09-10 | Disposition: A | Discharge status: Home / Self Care | Payer: Medicaid Other | Attending: Emergency Medicine | Admitting: Emergency Medicine

## 2020-09-10 DIAGNOSIS — Y9289 Other specified places as the place of occurrence of the external cause: Secondary | ICD-10-CM | POA: Insufficient documentation

## 2020-09-10 DIAGNOSIS — W1809XA Striking against other object with subsequent fall, initial encounter: Secondary | ICD-10-CM | POA: Diagnosis not present

## 2020-09-10 DIAGNOSIS — S0083XA Contusion of other part of head, initial encounter: Secondary | ICD-10-CM | POA: Insufficient documentation

## 2020-09-10 DIAGNOSIS — F1490 Cocaine use, unspecified, uncomplicated: Secondary | ICD-10-CM | POA: Insufficient documentation

## 2020-09-10 DIAGNOSIS — W19XXXA Unspecified fall, initial encounter: Secondary | ICD-10-CM

## 2020-09-10 DIAGNOSIS — F10929 Alcohol use, unspecified with intoxication, unspecified: Secondary | ICD-10-CM | POA: Diagnosis not present

## 2020-09-10 DIAGNOSIS — Y9302 Activity, running: Secondary | ICD-10-CM | POA: Diagnosis not present

## 2020-09-10 DIAGNOSIS — Z87891 Personal history of nicotine dependence: Secondary | ICD-10-CM | POA: Insufficient documentation

## 2020-09-10 DIAGNOSIS — S0990XA Unspecified injury of head, initial encounter: Secondary | ICD-10-CM | POA: Diagnosis present

## 2020-09-10 MED ORDER — QUETIAPINE FUMARATE 100 MG PO TABS: 100.0000 mg | ORAL_TABLET | Freq: Every day | ORAL | 0 refills | Status: DC

## 2020-09-10 MED ORDER — QUETIAPINE FUMARATE 100 MG PO TABS
100.0000 mg | ORAL_TABLET | Freq: Every day | ORAL | 0 refills | Status: DC
Start: 2020-09-10 — End: 2020-11-10

## 2020-09-10 MED ORDER — ACETAMINOPHEN 500 MG PO TABS
1000.0000 mg | ORAL_TABLET | Freq: Once | ORAL | Status: AC
  Administered 2020-09-10: 1000 mg via ORAL
  Filled 2020-09-10: qty 2

## 2020-09-10 MED ORDER — TRAZODONE HCL 100 MG PO TABS: 100.0000 mg | ORAL_TABLET | Freq: Every day | ORAL | 0 refills | Status: DC

## 2020-09-10 NOTE — ED Notes (Signed)
Pt verbalizes understanding of discharge instructions. Opportunity for questions and answers were provided. Pt discharged from the ED.   ?

## 2020-09-10 NOTE — ED Provider Notes (Signed)
[facility] EMERGENCY DEPARTMENT Provider Note   CSN: [id number] Arrival date & time: 09/10/20  0606     History Chief Complaint  Patient presents with   Joanne Gonzalez    Michelena Culmer is a 55 y.o. female.  HPI Patient with acknowledge use of crack and alcohol presents after a fall. Patient has other concerns about medication access, and primary care follow-up, but today she presents after a fall that occurred about 8 hours prior to my evaluation. She notes that she was drinking, using crack cocaine, smoking, when she had a fall, striking the right side of her head.  She is unsure about loss of consciousness, but since that time has had severe pain from the right forehead radiating inferiorly and posteriorly. No subsequent confusion, disorientation, weakness in any extremity. No vomiting, no vision changes. No medication taken for pain relief.     Past Medical History:  Diagnosis Date   CVA (cerebral infarction)    Schizophrenia ([place])    Seizures ([place])     Patient Active Problem List   Diagnosis Date Noted   Stroke (cerebrum) ([place]) 02/10/2019   Hypocalcemia 02/10/2019   History of seizures 02/10/2019   Homelessness 11/26/2018   Adjustment disorder with depressed mood 11/26/2018   Polysubstance abuse ([place])    Iron deficiency anemia 09/01/2014   Cocaine use 09/01/2014   Hyperglycemia 09/01/2014   Schizophrenia, paranoid ([place]) 08/31/2014   Left-sided weakness 08/30/2014   Hemiplegia, unspecified, affecting nondominant side 04/13/2013    Past Surgical History:  Procedure Laterality Date   LIMB SPARING RESECTION HIP W/ SADDLE JOINT REPLACEMENT       OB History   No obstetric history on file.     Family History  Problem Relation Age of Onset   Seizures Mother    Hypertension Mother     Social History   Tobacco Use   Smoking status: Former    Packs/day: 0.00    Pack years: 0.00    Types: Cigarettes   Smokeless tobacco: Never  Substance Use  Topics   Alcohol use: No    Comment: BAC not available   Drug use: Yes    Types: Cocaine, Marijuana    Comment: UDS not available for assessment    Home Medications Prior to Admission medications   Medication Sig Start Date End Date Taking? Authorizing Provider  QUEtiapine (SEROQUEL) 100 MG tablet Take 1 tablet (100 mg total) by mouth at bedtime. 09/10/20   Carmin Muskrat, MD  traZODone (DESYREL) 100 MG tablet Take 1 tablet (100 mg total) by mouth at bedtime. 09/10/20   Carmin Muskrat, MD  ferrous sulfate 325 (65 FE) MG tablet Take 1 tablet (325 mg total) by mouth daily with breakfast. Patient not taking: Reported on 03/14/2015 09/02/14 03/14/15  Luan Moore, MD  sertraline (ZOLOFT) 50 MG tablet Take 1 tablet (50 mg total) by mouth daily. Patient not taking: Reported on 03/14/2015 09/02/14 03/14/15  Luan Moore, MD    Allergies    Keflex [cephalexin], Penicillins, Bactrim [sulfamethoxazole-trimethoprim], and Lactose intolerance (gi)  Review of Systems   Review of Systems  Constitutional:        Per HPI, otherwise negative  HENT:         Per HPI, otherwise negative  Respiratory:         Per HPI, otherwise negative  Cardiovascular:        Per HPI, otherwise negative  Gastrointestinal:  Negative for vomiting.  Endocrine:       Negative  aside from HPI  Genitourinary:        Neg aside from HPI   Musculoskeletal:        Per HPI, otherwise negative  Skin:  Positive for wound.  Neurological:  Positive for syncope.   Physical Exam Updated Vital Signs BP 120/75 (BP Location: Left Arm)   Pulse 87   Temp 98.6 F (37 C) (Oral)   Resp 18   SpO2 100%   Physical Exam Vitals and nursing note reviewed.  Constitutional:      General: She is not in acute distress.    Appearance: She is well-developed.  HENT:     Head: Normocephalic.   Eyes:     Conjunctiva/sclera: Conjunctivae normal.  Cardiovascular:     Rate and Rhythm: Normal rate and regular rhythm.  Pulmonary:      Effort: Pulmonary effort is normal. No respiratory distress.     Breath sounds: Normal breath sounds. No stridor.  Abdominal:     General: There is no distension.  Musculoskeletal:     Cervical back: Normal range of motion and neck supple. No spinous process tenderness or muscular tenderness.  Skin:    General: Skin is warm and dry.  Neurological:     Mental Status: She is alert and oriented to person, place, and time.     Cranial Nerves: No cranial nerve deficit.     Motor: No weakness, tremor or abnormal muscle tone.    ED Results / Procedures / Treatments   Labs (all labs ordered are listed, but only abnormal results are displayed) Labs Reviewed - No data to display  EKG None  Radiology CT Head Wo Contrast  Result Date: 09/10/2020 CLINICAL DATA:  Facial trauma. EXAM: CT HEAD WITHOUT CONTRAST CT MAXILLOFACIAL WITHOUT CONTRAST TECHNIQUE: Multidetector CT imaging of the head and maxillofacial structures were performed using the standard protocol without intravenous contrast. Multiplanar CT image reconstructions of the maxillofacial structures were also generated. COMPARISON:  CT head 01/30/2020 FINDINGS: CT HEAD FINDINGS Brain: No evidence of acute infarction, hemorrhage, hydrocephalus, extra-axial collection or mass lesion/mass effect. Mild to moderate white matter hypodensity bilaterally similar to the prior study. Vascular: Negative for hyperdense vessel Skull: Negative for skull fracture.  Right frontal scalp contusion. Other: None CT MAXILLOFACIAL FINDINGS Osseous: Negative for facial fracture Poor dentition with multiple caries and periapical lucencies around upper teeth. Orbits: Negative for orbital fracture.  No orbital mass or edema. Sinuses: Negative Soft tissues: Mild right frontal scalp contusion IMPRESSION: 1. No acute intracranial abnormality. Chronic microvascular ischemic changes in the white matter 2. Negative for facial fracture 3. Poor dentition Electronically Signed   By:  Franchot Gallo M.D.   On: 09/10/2020 11:59   CT Maxillofacial WO CM  Result Date: 09/10/2020 CLINICAL DATA:  Facial trauma. EXAM: CT HEAD WITHOUT CONTRAST CT MAXILLOFACIAL WITHOUT CONTRAST TECHNIQUE: Multidetector CT imaging of the head and maxillofacial structures were performed using the standard protocol without intravenous contrast. Multiplanar CT image reconstructions of the maxillofacial structures were also generated. COMPARISON:  CT head 01/30/2020 FINDINGS: CT HEAD FINDINGS Brain: No evidence of acute infarction, hemorrhage, hydrocephalus, extra-axial collection or mass lesion/mass effect. Mild to moderate white matter hypodensity bilaterally similar to the prior study. Vascular: Negative for hyperdense vessel Skull: Negative for skull fracture.  Right frontal scalp contusion. Other: None CT MAXILLOFACIAL FINDINGS Osseous: Negative for facial fracture Poor dentition with multiple caries and periapical lucencies around upper teeth. Orbits: Negative for orbital fracture.  No orbital mass or edema. Sinuses: Negative  Soft tissues: Mild right frontal scalp contusion IMPRESSION: 1. No acute intracranial abnormality. Chronic microvascular ischemic changes in the white matter 2. Negative for facial fracture 3. Poor dentition Electronically Signed   By: Franchot Gallo M.D.   On: 09/10/2020 11:59    Procedures Procedures   Medications Ordered in ED Medications  acetaminophen (TYLENOL) tablet 1,000 mg (1,000 mg Oral Given 09/10/20 1154)    ED Course  I have reviewed the triage vital signs and the nursing notes.  Pertinent labs & imaging results that were available during my care of the patient were reviewed by me and considered in my medical decision making (see chart for details). 12:52 PM Patient in no distress, awake, alert.  We discussed today's findings and I reviewed her CT imaging. No evidence for intracranial hemorrhage, no evidence for fracture, and absent neurologic dysfunction, distress,  suspicion for mild concussion, but patient is otherwise appropriate for discharge. Patient's request for refill of her medications accommodated, reference to primary care clinic similarly accommodated, patient discharged in stable condition.    Final Clinical Impression(s) / ED Diagnoses Final diagnoses:  Fall, initial encounter    Rx / DC Orders ED Discharge Orders          Ordered    QUEtiapine (SEROQUEL) 100 MG tablet  Daily at bedtime        09/10/20 1251    traZODone (DESYREL) 100 MG tablet  Daily at bedtime        09/10/20 1251             Carmin Muskrat, MD 09/10/20 1254
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
## 2020-09-10 NOTE — Discharge Instructions (Addendum)
As discussed, it is normal to feel worse in the days immediately following a fall regardless of medication use. ° °However, please take all medication as directed, use ice packs liberally.  If you develop any new, or concerning changes in your condition, please return here for further evaluation and management.   ° °Otherwise, please return followup with your physician ° ° ° °

## 2020-09-26 ENCOUNTER — Emergency Department (HOSPITAL_COMMUNITY): Payer: Medicaid Other

## 2020-09-26 ENCOUNTER — Other Ambulatory Visit: Payer: Self-pay

## 2020-09-26 ENCOUNTER — Encounter (HOSPITAL_COMMUNITY): Payer: Self-pay | Admitting: Emergency Medicine

## 2020-09-26 ENCOUNTER — Emergency Department (HOSPITAL_COMMUNITY)
Admission: EM | Admit: 2020-09-26 | Discharge: 2020-09-27 | Disposition: A | Discharge status: Home / Self Care | Payer: Medicaid Other | Attending: Emergency Medicine | Admitting: Emergency Medicine

## 2020-09-26 ENCOUNTER — Ambulatory Visit (HOSPITAL_COMMUNITY)
Admission: EM | Admit: 2020-09-26 | Discharge: 2020-09-26 | Disposition: A | Discharge status: Home / Self Care | Payer: Medicaid Other

## 2020-09-26 DIAGNOSIS — R079 Chest pain, unspecified: Secondary | ICD-10-CM | POA: Insufficient documentation

## 2020-09-26 DIAGNOSIS — Z87891 Personal history of nicotine dependence: Secondary | ICD-10-CM | POA: Insufficient documentation

## 2020-09-26 DIAGNOSIS — B86 Scabies: Secondary | ICD-10-CM | POA: Insufficient documentation

## 2020-09-26 DIAGNOSIS — R0602 Shortness of breath: Secondary | ICD-10-CM | POA: Diagnosis not present

## 2020-09-26 LAB — COMPREHENSIVE METABOLIC PANEL
ALT: 15 U/L (ref 0–44)
AST: 23 U/L (ref 15–41)
Albumin: 4.1 g/dL (ref 3.5–5.0)
Alkaline Phosphatase: 78 U/L (ref 38–126)
Anion gap: 8 (ref 5–15)
BUN: 6 mg/dL (ref 6–20)
CO2: 23 mmol/L (ref 22–32)
Calcium: 9.4 mg/dL (ref 8.9–10.3)
Chloride: 104 mmol/L (ref 98–111)
Creatinine, Ser: 0.86 mg/dL (ref 0.44–1.00)
GFR, Estimated: >60 mL/min (ref >60)
Glucose, Bld: 83 mg/dL (ref 70–99)
Potassium: 3.4 mmol/L — ABNORMAL LOW (ref 3.5–5.1)
Sodium: 135 mmol/L (ref 135–145)
Total Bilirubin: 0.2 mg/dL — ABNORMAL LOW (ref 0.3–1.2)
Total Protein: 8.1 g/dL (ref 6.5–8.1)

## 2020-09-26 LAB — TROPONIN I (HIGH SENSITIVITY)
Troponin I (High Sensitivity): 10 ng/L (ref <18)
Troponin I (High Sensitivity): 10 ng/L (ref <18)

## 2020-09-26 LAB — CBC WITH DIFFERENTIAL/PLATELET
Abs Immature Granulocytes: 0.02 10*3/uL (ref 0.00–0.07)
Basophils Absolute: 0.1 10*3/uL (ref 0.0–0.1)
Basophils Relative: 1 %
Eosinophils Absolute: 0.1 10*3/uL (ref 0.0–0.5)
Eosinophils Relative: 1 %
HCT: 29.5 % — ABNORMAL LOW (ref 36.0–46.0)
Hemoglobin: 8.6 g/dL — ABNORMAL LOW (ref 12.0–15.0)
Immature Granulocytes: 0 %
Lymphocytes Relative: 25 %
Lymphs Abs: 1.5 10*3/uL (ref 0.7–4.0)
MCH: 21.6 pg — ABNORMAL LOW (ref 26.0–34.0)
MCHC: 29.2 g/dL — ABNORMAL LOW (ref 30.0–36.0)
MCV: 74.1 fL — ABNORMAL LOW (ref 80.0–100.0)
Monocytes Absolute: 0.5 10*3/uL (ref 0.1–1.0)
Monocytes Relative: 8 %
Neutro Abs: 4 10*3/uL (ref 1.7–7.7)
Neutrophils Relative %: 65 %
Platelets: 394 10*3/uL (ref 150–400)
RBC: 3.98 MIL/uL (ref 3.87–5.11)
RDW: 16.1 % — ABNORMAL HIGH (ref 11.5–15.5)
WBC: 6.2 10*3/uL (ref 4.0–10.5)
nRBC: 0 % (ref 0.0–0.2)

## 2020-09-26 NOTE — ED Provider Notes (Addendum)
Emergency Medicine Provider Triage Evaluation Note  Joanne Gonzalez , a 55 y.o. female  was evaluated in triage.  Pt complains of CP/SOB. Sx started PTA while arguing with her daughter. Lasted about 3-4 hours but she states it has improved significantly. No n/v or diaphoresis. Also complaints of wounds to her arms and legs. States she "sees dark spots" on her skin and will then pick the region causing these wounds. Pt states she smoked cocaine in a rolled cigarette yesterday.   Physical Exam  BP (!) 148/97   Pulse 75   Temp 98.9 F (37.2 C) (Oral)   Resp 18   Ht 5\' 5"  (1.651 m)   Wt 59 kg   SpO2 100%   BMI 21.63 kg/m  Gen:   Awake, no distress   Resp:  Normal effort  MSK:   Moves extremities without difficulty  Other:    Medical Decision Making  Medically screening exam initiated at 3:09 PM.  Appropriate orders placed.  Joanne Gonzalez was informed that the remainder of the evaluation will be completed by another provider, this initial triage assessment does not replace that evaluation, and the importance of remaining in the ED until their evaluation is complete.     Rayna Sexton, PA-C 09/26/20 1510    Rayna Sexton, PA-C 09/26/20 1517    Lorelle Gibbs, DO 09/29/20 1834

## 2020-09-26 NOTE — ED Triage Notes (Signed)
Pt reports bug bites all over her body that have been there for about a month and they're not going away. Pt attempted otc creams with no relief.  Pt also reports cp and sob that started 3-4 hours ago. Pt reports a lot of stress and anxiety with a headache because she has been arguing with her daughter. Pt also reports cocaine use 2 days ago.

## 2020-09-27 MED ORDER — PERMETHRIN 5 % EX CREA: TOPICAL_CREAM | CUTANEOUS | 0 refills | Status: DC

## 2020-09-27 NOTE — Discharge Instructions (Addendum)
You were evaluated in the Emergency Department and after careful evaluation, we did not find any emergent condition requiring admission or further testing in the hospital.  Your exam/testing today was overall reassuring.  No evidence of heart damage or heart attack.  Rash may be due to scabies.  Please take the permethrin cream as directed.  You should wash all of your clothes and bed sheets with hot water.  Please return to the Emergency Department if you experience any worsening of your condition.  Thank you for allowing [place] to be a part of your care.
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
## 2020-10-23 ENCOUNTER — Ambulatory Visit: Payer: Medicaid Other | Admitting: Physician Assistant

## 2020-11-10 ENCOUNTER — Other Ambulatory Visit (HOSPITAL_COMMUNITY)
Admission: RE | Admit: 2020-11-10 | Discharge: 2020-11-10 | Disposition: A | Discharge status: Home / Self Care | Payer: Medicaid Other | Source: Ambulatory Visit | Attending: Physician Assistant | Admitting: Physician Assistant

## 2020-11-10 ENCOUNTER — Other Ambulatory Visit: Payer: Self-pay

## 2020-11-10 ENCOUNTER — Ambulatory Visit: Payer: Medicaid Other | Admitting: Physician Assistant

## 2020-11-10 VITALS — BP 138/78 | HR 84 | Temp 98.7°F | Resp 18 | Ht 65.0 in | Wt 140.0 lb

## 2020-11-10 DIAGNOSIS — F1490 Cocaine use, unspecified, uncomplicated: Secondary | ICD-10-CM

## 2020-11-10 DIAGNOSIS — Z87898 Personal history of other specified conditions: Secondary | ICD-10-CM

## 2020-11-10 DIAGNOSIS — E7800 Pure hypercholesterolemia, unspecified: Secondary | ICD-10-CM

## 2020-11-10 DIAGNOSIS — B379 Candidiasis, unspecified: Secondary | ICD-10-CM

## 2020-11-10 DIAGNOSIS — D509 Iron deficiency anemia, unspecified: Secondary | ICD-10-CM

## 2020-11-10 DIAGNOSIS — A599 Trichomoniasis, unspecified: Secondary | ICD-10-CM

## 2020-11-10 DIAGNOSIS — F5104 Psychophysiologic insomnia: Secondary | ICD-10-CM

## 2020-11-10 DIAGNOSIS — R7303 Prediabetes: Secondary | ICD-10-CM

## 2020-11-10 DIAGNOSIS — E876 Hypokalemia: Secondary | ICD-10-CM

## 2020-11-10 DIAGNOSIS — G8929 Other chronic pain: Secondary | ICD-10-CM

## 2020-11-10 DIAGNOSIS — F200 Paranoid schizophrenia: Secondary | ICD-10-CM | POA: Diagnosis not present

## 2020-11-10 DIAGNOSIS — B9689 Other specified bacterial agents as the cause of diseases classified elsewhere: Secondary | ICD-10-CM

## 2020-11-10 DIAGNOSIS — F1910 Other psychoactive substance abuse, uncomplicated: Secondary | ICD-10-CM

## 2020-11-10 DIAGNOSIS — M25552 Pain in left hip: Secondary | ICD-10-CM

## 2020-11-10 DIAGNOSIS — N760 Acute vaginitis: Secondary | ICD-10-CM

## 2020-11-10 DIAGNOSIS — E559 Vitamin D deficiency, unspecified: Secondary | ICD-10-CM

## 2020-11-10 DIAGNOSIS — M25551 Pain in right hip: Secondary | ICD-10-CM

## 2020-11-10 LAB — POCT URINALYSIS DIP (CLINITEK)
Bilirubin, UA: NEGATIVE
Blood, UA: NEGATIVE
Glucose, UA: NEGATIVE mg/dL
Ketones, POC UA: NEGATIVE mg/dL
Nitrite, UA: NEGATIVE
POC PROTEIN,UA: NEGATIVE
Spec Grav, UA: 1.02 (ref 1.010–1.025)
Urobilinogen, UA: 0.2 E.U./dL
pH, UA: 6 (ref 5.0–8.0)

## 2020-11-10 MED ORDER — QUETIAPINE FUMARATE 100 MG PO TABS: 100.0000 mg | ORAL_TABLET | Freq: Every day | ORAL | 1 refills | Status: DC

## 2020-11-10 MED ORDER — FLUCONAZOLE 150 MG PO TABS: 150.0000 mg | ORAL_TABLET | Freq: Once | ORAL | 0 refills | Status: AC

## 2020-11-10 MED ORDER — TRAZODONE HCL 100 MG PO TABS: ORAL_TABLET | ORAL | 1 refills | Status: DC

## 2020-11-10 NOTE — Patient Instructions (Signed)
You will restart Seroquel 100 mg once daily, if you are still having difficulty with sleep, you can take 1/2 tablet to 1 full tablet of trazodone.  You will take 1 dose of Diflucan to help you with your yeast infection.  Continue using Aleve as needed to help with your chronic hip pain, make sure that you are staying very well-hydrated.  We will call you with today's lab results.  You will follow-up with the mobile unit in 2 weeks for further review.  Joanne Rad, PA-C Physician Assistant [facility] Medicine [email or web address]   Vaginal Yeast Infection, Adult  Vaginal yeast infection is a condition that causes vaginal discharge as well as soreness, swelling, and redness (inflammation) of the vagina. This is a common condition. Some women get this infectionfrequently. What are the causes? This condition is caused by a change in the normal balance of the yeast (candida) and bacteria that live in the vagina. This change causes an overgrowth ofyeast, which causes the inflammation. What increases the risk? The condition is more likely to develop in women who: Take antibiotic medicines. Have diabetes. Take birth control pills. Are pregnant. Douche often. Have a weak body defense system (immune system). Have been taking steroid medicines for a long time. Frequently wear tight clothing. What are the signs or symptoms? Symptoms of this condition include: White, thick, creamy vaginal discharge. Swelling, itching, redness, and irritation of the vagina. The lips of the vagina (vulva) may be affected as well. Pain or a burning feeling while urinating. Pain during sex. How is this diagnosed? This condition is diagnosed based on: Your medical history. A physical exam. A pelvic exam. Your health care provider will examine a sample of your vaginal discharge under a microscope. Your health care provider may send this sample for testing to  confirm the diagnosis. How is this treated? This condition is treated with medicine. Medicines may be over-the-counter or prescription. You may be told to use one or more of the following: Medicine that is taken by mouth (orally). Medicine that is applied as a cream (topically). Medicine that is inserted directly into the vagina (suppository). Follow these instructions at home:  Lifestyle Do not have sex until your health care provider approves. Tell your sex partner that you have a yeast infection. That person should go to his or her health care provider and ask if they should also be treated. Do not wear tight clothes, such as pantyhose or tight pants. Wear breathable cotton underwear. General instructions Take or apply over-the-counter and prescription medicines only as told by your health care provider. Eat more yogurt. This may help to keep your yeast infection from returning. Do not use tampons until your health care provider approves. Try taking a sitz bath to help with discomfort. This is a warm water bath that is taken while you are sitting down. The water should only come up to your hips and should cover your buttocks. Do this 3-4 times per day or as told by your health care provider. Do not douche. If you have diabetes, keep your blood sugar levels under control. Keep all follow-up visits as told by your health care provider. This is important. Contact a health care provider if: You have a fever. Your symptoms go away and then return. Your symptoms do not get better with treatment. Your symptoms get worse. You have new symptoms. You develop blisters in or around your vagina. You have blood coming from your vagina and it is not your menstrual  period. You develop pain in your abdomen. Summary Vaginal yeast infection is a condition that causes discharge as well as soreness, swelling, and redness (inflammation) of the vagina. This condition is treated with medicine. Medicines  may be over-the-counter or prescription. Take or apply over-the-counter and prescription medicines only as told by your health care provider. Do not douche. Do not have sex or use tampons until your health care provider approves. Contact a health care provider if your symptoms do not get better with treatment or your symptoms go away and then return. This information is not intended to replace advice given to you by your health care provider. Make sure you discuss any questions you have with your healthcare provider. Document Revised: 03/05/2020 Document Reviewed: 08/01/2017 Elsevier Patient Education  [place].
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
## 2020-11-10 NOTE — Progress Notes (Signed)
Patient has taken vitamins today. Patient has eaten today. Patient reported bilateral hip pain from artificial hip.

## 2020-11-11 DIAGNOSIS — E876 Hypokalemia: Secondary | ICD-10-CM | POA: Insufficient documentation

## 2020-11-11 DIAGNOSIS — F5104 Psychophysiologic insomnia: Secondary | ICD-10-CM | POA: Insufficient documentation

## 2020-11-11 DIAGNOSIS — G8929 Other chronic pain: Secondary | ICD-10-CM | POA: Insufficient documentation

## 2020-11-11 DIAGNOSIS — E7800 Pure hypercholesterolemia, unspecified: Secondary | ICD-10-CM | POA: Insufficient documentation

## 2020-11-11 DIAGNOSIS — A599 Trichomoniasis, unspecified: Secondary | ICD-10-CM | POA: Insufficient documentation

## 2020-11-11 DIAGNOSIS — E559 Vitamin D deficiency, unspecified: Secondary | ICD-10-CM | POA: Insufficient documentation

## 2020-11-11 LAB — COMP. METABOLIC PANEL (12)
AST: 25 IU/L (ref 0–40)
Albumin/Globulin Ratio: 1.5 (ref 1.2–2.2)
Albumin: 4.6 g/dL (ref 3.8–4.9)
Alkaline Phosphatase: 89 IU/L (ref 44–121)
BUN/Creatinine Ratio: 11 (ref 9–23)
BUN: 10 mg/dL (ref 6–24)
Bilirubin Total: <0.2 mg/dL (ref 0.0–1.2)
Calcium: 9.4 mg/dL (ref 8.7–10.2)
Chloride: 107 mmol/L — ABNORMAL HIGH (ref 96–106)
Creatinine, Ser: 0.88 mg/dL (ref 0.57–1.00)
Globulin, Total: 3 g/dL (ref 1.5–4.5)
Glucose: 106 mg/dL — ABNORMAL HIGH (ref 65–99)
Potassium: 4.3 mmol/L (ref 3.5–5.2)
Sodium: 145 mmol/L — ABNORMAL HIGH (ref 134–144)
Total Protein: 7.6 g/dL (ref 6.0–8.5)
eGFR: 79 mL/min/{1.73_m2} (ref >59)

## 2020-11-11 LAB — IRON,TIBC AND FERRITIN PANEL
Ferritin: 9 ng/mL — ABNORMAL LOW (ref 15–150)
Iron Saturation: 5 % — CL (ref 15–55)
Iron: 26 ug/dL — ABNORMAL LOW (ref 27–159)
Total Iron Binding Capacity: 477 ug/dL — ABNORMAL HIGH (ref 250–450)
UIBC: 451 ug/dL — ABNORMAL HIGH (ref 131–425)

## 2020-11-11 LAB — CBC WITH DIFFERENTIAL/PLATELET
Basophils Absolute: 0.1 10*3/uL (ref 0.0–0.2)
Basos: 1 %
EOS (ABSOLUTE): 0.2 10*3/uL (ref 0.0–0.4)
Eos: 3 %
Hematocrit: 26.8 % — ABNORMAL LOW (ref 34.0–46.6)
Hemoglobin: 8 g/dL — ABNORMAL LOW (ref 11.1–15.9)
Immature Grans (Abs): 0 10*3/uL (ref 0.0–0.1)
Immature Granulocytes: 0 %
Lymphocytes Absolute: 1.7 10*3/uL (ref 0.7–3.1)
Lymphs: 23 %
MCH: 21.8 pg — ABNORMAL LOW (ref 26.6–33.0)
MCHC: 29.9 g/dL — ABNORMAL LOW (ref 31.5–35.7)
MCV: 73 fL — ABNORMAL LOW (ref 79–97)
Monocytes Absolute: 0.7 10*3/uL (ref 0.1–0.9)
Monocytes: 9 %
Neutrophils Absolute: 4.7 10*3/uL (ref 1.4–7.0)
Neutrophils: 64 %
Platelets: 369 10*3/uL (ref 150–450)
RBC: 3.67 x10E6/uL — ABNORMAL LOW (ref 3.77–5.28)
RDW: 18.4 % — ABNORMAL HIGH (ref 11.7–15.4)
WBC: 7.3 10*3/uL (ref 3.4–10.8)

## 2020-11-11 LAB — CERVICOVAGINAL ANCILLARY ONLY
Bacterial Vaginitis (gardnerella): POSITIVE — AB
Candida Glabrata: NEGATIVE
Candida Vaginitis: POSITIVE — AB
Chlamydia: NEGATIVE
Comment: NEGATIVE
Comment: NEGATIVE
Comment: NEGATIVE
Comment: NEGATIVE
Comment: NEGATIVE
Comment: NORMAL
Neisseria Gonorrhea: NEGATIVE
Trichomonas: POSITIVE — AB

## 2020-11-11 LAB — VITAMIN D 25 HYDROXY (VIT D DEFICIENCY, FRACTURES): Vit D, 25-Hydroxy: 16.9 ng/mL — ABNORMAL LOW (ref 30.0–100.0)

## 2020-11-11 MED ORDER — IRON (FERROUS SULFATE) 325 (65 FE) MG PO TABS
325.0000 mg | ORAL_TABLET | Freq: Every day | ORAL | 1 refills | Status: DC
Start: 2020-11-11 — End: 2021-02-08

## 2020-11-11 MED ORDER — VITAMIN D (ERGOCALCIFEROL) 1.25 MG (50000 UNIT) PO CAPS: 50000.0000 [IU] | ORAL_CAPSULE | ORAL | 2 refills | Status: DC

## 2020-11-11 MED ORDER — METRONIDAZOLE 500 MG PO TABS: 500.0000 mg | ORAL_TABLET | Freq: Two times a day (BID) | ORAL | 0 refills | Status: AC

## 2020-11-11 NOTE — Addendum Note (Signed)
Addended by: Kennieth Rad on: 11/11/2020 11:25 AM   Modules accepted: Orders

## 2020-11-11 NOTE — Addendum Note (Signed)
Addended by: Kennieth Rad on: 11/11/2020 02:32 PM   Modules accepted: Orders

## 2020-11-12 LAB — URINE CULTURE

## 2020-11-13 ENCOUNTER — Telehealth: Payer: Self-pay | Admitting: *Deleted

## 2020-11-13 NOTE — Telephone Encounter (Signed)
-----   Message from Kennieth Rad, [place] sent at 11/11/2020  2:32 PM EDT ----- Please call patient and let her know that her vaginal swab did return positive for a yeast infection, bacterial infection and trichomonas.  She will take metronidazole twice a day for the next 7 days for the trichomonas as well as a bacterial vaginitis.  She is already taking Diflucan for the yeast infection.  She will need to have a test of cure for the trichomonas 2 to 6 weeks after she has completed the treatment.  She needs to refrain from intercourse until after she has completed the treatment and she needs to make sure that she notifies all her partners.  Prescription sent to the pharmacy

## 2020-11-13 NOTE — Telephone Encounter (Signed)
RN is aware of patients results and has began patients medications and aware of planning FU visit.

## 2020-11-13 NOTE — Telephone Encounter (Signed)
-----   Message from Kennieth Rad, [place] sent at 11/11/2020 11:25 AM EDT ----- Please call patient and let her know that her iron is very low, she needs to start with low dose of ferrous sulfate 325 mgm once daily, and if tolerated, increase dose to 2 tabs daily.  It is important that she have these levels rechecked in 6 weeks.  Her vitamin D is low, she needs to take 50,000 units once a week for the next 12 weeks and have it rechecked at that time.  Her potassium level is within normal limits.  Her kidney function liver function are within normal limits.  Prescription for iron and vitamin D sent to her pharmacy.

## 2020-11-24 ENCOUNTER — Other Ambulatory Visit: Payer: Self-pay

## 2020-11-24 ENCOUNTER — Ambulatory Visit: Payer: Medicaid Other | Admitting: Physician Assistant

## 2020-11-24 VITALS — BP 133/83 | HR 82 | Temp 98.6°F | Resp 18 | Ht 65.0 in | Wt 146.0 lb

## 2020-11-24 DIAGNOSIS — F200 Paranoid schizophrenia: Secondary | ICD-10-CM

## 2020-11-24 DIAGNOSIS — E559 Vitamin D deficiency, unspecified: Secondary | ICD-10-CM

## 2020-11-24 DIAGNOSIS — D509 Iron deficiency anemia, unspecified: Secondary | ICD-10-CM

## 2020-11-24 DIAGNOSIS — F5104 Psychophysiologic insomnia: Secondary | ICD-10-CM | POA: Diagnosis not present

## 2020-11-24 DIAGNOSIS — N946 Dysmenorrhea, unspecified: Secondary | ICD-10-CM | POA: Diagnosis not present

## 2020-11-24 DIAGNOSIS — B379 Candidiasis, unspecified: Secondary | ICD-10-CM

## 2020-11-24 DIAGNOSIS — A599 Trichomoniasis, unspecified: Secondary | ICD-10-CM

## 2020-11-24 DIAGNOSIS — F1910 Other psychoactive substance abuse, uncomplicated: Secondary | ICD-10-CM

## 2020-11-24 MED ORDER — IBUPROFEN 800 MG PO TABS: 800.0000 mg | ORAL_TABLET | Freq: Three times a day (TID) | ORAL | 1 refills | Status: DC | PRN

## 2020-11-24 NOTE — Patient Instructions (Addendum)
To help with your cramping, you will take ibuprofen 800 mg every 8 hours as needed.  I have started a referral for you to be seen by gynecology for further evaluation.  I do encourage you to continue your iron and vitamin D supplementations.  Please feel free to return to the mobile unit upon your return from [place] if needed.  Kennieth Rad, PA-C Physician Assistant [facility] Medicine [email or web address]   Dysmenorrhea Dysmenorrhea refers to cramps caused by the muscles of the uterus tightening (contracting) during a menstrual period. Dysmenorrhea may be mild, or it may be severe enough to interfere with everyday activities for a few days each month. Primary dysmenorrhea is menstrual cramps that last a couple of days when a female starts having menstrual periods or soon after. As a female gets older or has ababy, the cramps will usually lessen or disappear. Secondary dysmenorrhea begins later in life and is caused by a disorder in the reproductive system. It lasts longer, and it may cause more pain than primary dysmenorrhea. The pain may start before the period and last a few days afterthe period. What are the causes? Dysmenorrhea is usually caused by an underlying problem, such as: Endometriosis. The tissue that lines the uterus (endometrium) growing outside of the uterus in other areas of the body. Adenomyosis. Endometrial tissue growing into the muscular walls of the uterus. Pelvic congestive syndrome. Blood vessels in the pelvis that fill with blood just before the menstrual period. Overgrowth of cells (polyps) in the endometrium or the lower part of the uterus (cervix). Uterine prolapse. The uterus dropping down into the vagina due to stretched or weak muscles. Bladder problems, such as infection or inflammation. Intestinal problems, such as a tumor or irritable bowel syndrome. Cancer of the reproductive organs or bladder. Other causes  of this condition may result from: A severely tipped uterus. A cervix that is closed or has a small opening. Noncancerous (benign) tumors in the uterus (fibroids). Pelvic inflammatory disease (PID). Pelvic scarring (adhesions) from a previous surgery. An ovarian cyst. An IUD (intrauterine device). What increases the risk? You are more likely to develop this condition if: You are younger than 53 years old. You started puberty early. You have irregular or heavy bleeding. You have never given birth. You have a family history of dysmenorrhea. You smoke or use nicotine products. You have high body weight or a low body weight. What are the signs or symptoms? Symptoms of this condition include: Cramping, throbbing pain in lower abdomen or lower back, or a feeling of fullness in the lower abdomen. Periods lasting for longer than 7 days. Headaches. Bloating. Fatigue. Nausea or vomiting. Diarrhea or loose stools. Sweating or dizziness. How is this diagnosed? This condition may be diagnosed based on: Your symptoms. Your medical history. A physical exam. Blood tests. A Pap test. This is a test in which cells from the cervix are tested for signs of cancer or infection. A pregnancy test. You may also have other tests, including: Imaging tests, such as: Ultrasound. A procedure to remove and examine a sample of endometrial tissue (dilation and curettage, D&C). A procedure to visually examine the inside of: The uterus (hysteroscopy). The abdomen or pelvis (laparoscopy). The bladder (cystoscopy). X-rays. CT scan. MRI. How is this treated? Treatment depends on the cause of the dysmenorrhea. Treatment may include medicines, such as: Pain medicines. Hormone replacement therapy. Injections of progesterone to stop the menstrual period. Birth control pills that contain the hormone progesterone. An IUD that  contains the hormone progesterone. NSAIDs, such as ibuprofen. These may help to  stop the production of hormones that cause cramps. Antidepressant medicines. Other treatment may include: Surgery to remove adhesions, endometriosis, ovarian cysts, fibroids, or the entire uterus (hysterectomy). Endometrial ablation. This is a procedure to destroy the endometrium. Presacral neurectomy. This is a procedure to cut the nerves in the bottom of the spine (sacrum) that go to the reproductive organs. Sacral nerve stimulation. This is a procedure to apply an electric current to nerves in the sacrum. Exercise and physical therapy. Meditation, yoga, and acupuncture. Work with your health care provider to determine what treatment or combinationof treatments is best for you. Follow these instructions at home: Relieving pain and cramping  If directed, apply heat to your lower back or abdomen when you experience pain or cramps. Use the heat source that your health care provider recommends, such as a moist heat pack or a heating pad. Place a towel between your skin and the heat source. Leave the heat on for 20-30 minutes. Remove the heat if your skin turns bright red. This is especially important if you are unable to feel pain, heat, or cold. You may have a greater risk of getting burned. Do not sleep with a heating pad on. Exercise. Activities such as walking, swimming, or biking can help to relieve cramps. Massage your lower back or abdomen to help relieve pain.  General instructions Take over-the-counter and prescription medicines only as told by your health care provider. Ask your health care provider if the medicine prescribed to you requires you to avoid driving or using machinery. Avoid alcohol and caffeine during and right before your period. These can make cramps worse. Do not use any products that contain nicotine or tobacco. These products include cigarettes, chewing tobacco, and vaping devices, such as e-cigarettes. If you need help quitting, ask your health care  provider. Keep all follow-up visits. This is important. Contact a health care provider if: You have pain that gets worse or does not get better with medicine. You have pain with sex. You develop nausea or vomiting with your period that is not controlled with medicine. Get help right away if: You faint. Summary Dysmenorrhea refers to cramps caused by the muscles of the uterus tightening (contracting) during a menstrual period. Dysmenorrhea may be mild, or it may be severe enough to interfere with everyday activities for a few days each month. Treatment depends on the cause of the dysmenorrhea. Work with your health care provider to determine what treatment or combination of treatments is best for you. This information is not intended to replace advice given to you by your health care provider. Make sure you discuss any questions you have with your healthcare provider. Document Revised: 10/31/2019 Document Reviewed: 10/31/2019 Elsevier Patient Education  2022 [employer].

## 2020-11-24 NOTE — Progress Notes (Signed)
Patient had 2 bananas. Patient has not taken medication today Patient reports cramping with cycle currently. Patient states she wears a tampon and two pads due to heavy bleeding.
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
## 2020-11-25 DIAGNOSIS — N946 Dysmenorrhea, unspecified: Secondary | ICD-10-CM | POA: Insufficient documentation

## 2020-12-23 ENCOUNTER — Encounter (HOSPITAL_COMMUNITY): Payer: Self-pay

## 2020-12-23 ENCOUNTER — Emergency Department (HOSPITAL_COMMUNITY)
Admission: EM | Admit: 2020-12-23 | Discharge: 2020-12-23 | Disposition: A | Discharge status: Home / Self Care | Payer: Medicaid Other | Attending: Emergency Medicine | Admitting: Emergency Medicine

## 2020-12-23 ENCOUNTER — Emergency Department (HOSPITAL_COMMUNITY): Payer: Medicaid Other

## 2020-12-23 ENCOUNTER — Other Ambulatory Visit: Payer: Self-pay

## 2020-12-23 DIAGNOSIS — F1410 Cocaine abuse, uncomplicated: Secondary | ICD-10-CM | POA: Diagnosis not present

## 2020-12-23 DIAGNOSIS — R0789 Other chest pain: Secondary | ICD-10-CM | POA: Insufficient documentation

## 2020-12-23 DIAGNOSIS — Z87891 Personal history of nicotine dependence: Secondary | ICD-10-CM | POA: Diagnosis not present

## 2020-12-23 DIAGNOSIS — R079 Chest pain, unspecified: Secondary | ICD-10-CM | POA: Diagnosis present

## 2020-12-23 DIAGNOSIS — R0602 Shortness of breath: Secondary | ICD-10-CM | POA: Diagnosis not present

## 2020-12-23 DIAGNOSIS — N946 Dysmenorrhea, unspecified: Secondary | ICD-10-CM

## 2020-12-23 LAB — CBC WITH DIFFERENTIAL/PLATELET
Abs Immature Granulocytes: 0.01 10*3/uL (ref 0.00–0.07)
Basophils Absolute: 0.1 10*3/uL (ref 0.0–0.1)
Basophils Relative: 1 %
Eosinophils Absolute: 0.2 10*3/uL (ref 0.0–0.5)
Eosinophils Relative: 2 %
HCT: 42.4 % (ref 36.0–46.0)
Hemoglobin: 13.4 g/dL (ref 12.0–15.0)
Immature Granulocytes: 0 %
Lymphocytes Relative: 21 %
Lymphs Abs: 1.8 10*3/uL (ref 0.7–4.0)
MCH: 27.1 pg (ref 26.0–34.0)
MCHC: 31.6 g/dL (ref 30.0–36.0)
MCV: 85.7 fL (ref 80.0–100.0)
Monocytes Absolute: 0.6 10*3/uL (ref 0.1–1.0)
Monocytes Relative: 7 %
Neutro Abs: 6 10*3/uL (ref 1.7–7.7)
Neutrophils Relative %: 69 %
Platelets: 436 10*3/uL — ABNORMAL HIGH (ref 150–400)
RBC: 4.95 MIL/uL (ref 3.87–5.11)
RDW: 24.6 % — ABNORMAL HIGH (ref 11.5–15.5)
WBC: 8.7 10*3/uL (ref 4.0–10.5)
nRBC: 0 % (ref 0.0–0.2)

## 2020-12-23 LAB — COMPREHENSIVE METABOLIC PANEL
ALT: 17 U/L (ref 0–44)
AST: 19 U/L (ref 15–41)
Albumin: 4.8 g/dL (ref 3.5–5.0)
Alkaline Phosphatase: 85 U/L (ref 38–126)
Anion gap: 11 (ref 5–15)
BUN: 10 mg/dL (ref 6–20)
CO2: 25 mmol/L (ref 22–32)
Calcium: 10 mg/dL (ref 8.9–10.3)
Chloride: 104 mmol/L (ref 98–111)
Creatinine, Ser: 0.86 mg/dL (ref 0.44–1.00)
GFR, Estimated: >60 mL/min (ref >60)
Glucose, Bld: 92 mg/dL (ref 70–99)
Potassium: 3.7 mmol/L (ref 3.5–5.1)
Sodium: 140 mmol/L (ref 135–145)
Total Bilirubin: 0.4 mg/dL (ref 0.3–1.2)
Total Protein: 9.3 g/dL — ABNORMAL HIGH (ref 6.5–8.1)

## 2020-12-23 LAB — TROPONIN I (HIGH SENSITIVITY)
Troponin I (High Sensitivity): 7 ng/L (ref <18)
Troponin I (High Sensitivity): 8 ng/L (ref <18)

## 2020-12-23 MED ORDER — KETOROLAC TROMETHAMINE 30 MG/ML IJ SOLN
30.0000 mg | Freq: Once | INTRAMUSCULAR | Status: DC
  Filled 2020-12-23: qty 1

## 2020-12-23 MED ORDER — IBUPROFEN 800 MG PO TABS: 800.0000 mg | ORAL_TABLET | Freq: Three times a day (TID) | ORAL | 0 refills | Status: DC | PRN

## 2020-12-23 MED ORDER — KETOROLAC TROMETHAMINE 30 MG/ML IJ SOLN
30.0000 mg | Freq: Once | INTRAMUSCULAR | Status: AC
  Administered 2020-12-23: 30 mg via INTRAMUSCULAR

## 2020-12-23 NOTE — ED Provider Notes (Signed)
[facility] DEPT Provider Note   CSN: [id number] Arrival date & time: 12/23/20  8527     History Chief Complaint  Patient presents with   Chest Pain   Shortness of Breath    Joanne Gonzalez is a 55 y.o. female.  She is here with a complaint of left-sided chest pain that is been going on for 2 days.  She attributes it to smoking crack cocaine.  Feels short of breath.  Has tried nothing for it.  No prior history of same.  No history of cardiac disease.  Pain is worse with movement and breathing.  The history is provided by the patient.  Chest Pain Pain location:  L chest Pain quality: stabbing   Pain radiates to:  L shoulder Pain severity:  Severe Onset quality:  Gradual Duration:  4 days Timing:  Constant Progression:  Unchanged Chronicity:  New Context comment:  Cocaine Relieved by:  None tried Worsened by:  Deep breathing and movement Ineffective treatments:  None tried Associated symptoms: shortness of breath   Associated symptoms: no abdominal pain, no back pain, no cough, no dysphagia, no fever, no headache, no nausea and no vomiting   Shortness of Breath Associated symptoms: chest pain   Associated symptoms: no abdominal pain, no cough, no fever, no headaches, no rash, no sore throat and no vomiting       Past Medical History:  Diagnosis Date   CVA (cerebral infarction)    Schizophrenia ([place])    Seizures ([place])     Patient Active Problem List   Diagnosis Date Noted   Dysmenorrhea 11/25/2020   Psychophysiological insomnia 11/11/2020   Elevated LDL cholesterol level 11/11/2020   Hypokalemia 11/11/2020   Chronic pain of both hips 11/11/2020   Vitamin D deficiency 11/11/2020   Trichimoniasis 11/11/2020   Stroke (cerebrum) ([place]) 02/10/2019   Hypocalcemia 02/10/2019   History of seizures 02/10/2019   Homelessness 11/26/2018   Adjustment disorder with depressed mood 11/26/2018   Polysubstance abuse ([place])    Iron deficiency anemia  09/01/2014   Cocaine use 09/01/2014   Hyperglycemia 09/01/2014   Schizophrenia, paranoid ([place]) 08/31/2014   Left-sided weakness 08/30/2014   Hemiplegia, unspecified, affecting nondominant side 04/13/2013    Past Surgical History:  Procedure Laterality Date   LIMB SPARING RESECTION HIP W/ SADDLE JOINT REPLACEMENT       OB History   No obstetric history on file.     Family History  Problem Relation Age of Onset   Seizures Mother    Hypertension Mother     Social History   Tobacco Use   Smoking status: Former    Packs/day: 0.25    Types: Cigarettes   Smokeless tobacco: Never  Substance Use Topics   Alcohol use: Yes    Comment: occ   Drug use: Yes    Types: Cocaine, Marijuana    Comment: occ    Home Medications Prior to Admission medications   Medication Sig Start Date End Date Taking? Authorizing Provider  ibuprofen (ADVIL) 800 MG tablet Take 1 tablet (800 mg total) by mouth every 8 (eight) hours as needed. 11/24/20   Mayers, Cari S, PA-C  Iron, Ferrous Sulfate, 325 (65 Fe) MG TABS Take 325 mg by mouth daily. Increase to BID in one week if tolerated 11/11/20   Mayers, Cari S, PA-C  QUEtiapine (SEROQUEL) 100 MG tablet Take 1 tablet (100 mg total) by mouth at bedtime. 11/10/20   Mayers, Cari S, PA-C  traZODone (DESYREL) 100  MG tablet Take 1/2 - 1 full tablet PO QHS PRN 11/10/20   Mayers, Cari S, PA-C  Vitamin D, Ergocalciferol, (DRISDOL) 1.25 MG (50000 UNIT) CAPS capsule Take 1 capsule (50,000 Units total) by mouth every 7 (seven) days. 11/11/20   Mayers, Cari S, PA-C  sertraline (ZOLOFT) 50 MG tablet Take 1 tablet (50 mg total) by mouth daily. Patient not taking: Reported on 03/14/2015 09/02/14 03/14/15  Luan Moore, MD    Allergies    Keflex [cephalexin], Penicillins, Bactrim [sulfamethoxazole-trimethoprim], and Lactose intolerance (gi)  Review of Systems   Review of Systems  Constitutional:  Negative for fever.  HENT:  Negative for sore throat and trouble swallowing.    Eyes:  Negative for visual disturbance.  Respiratory:  Positive for shortness of breath. Negative for cough.   Cardiovascular:  Positive for chest pain.  Gastrointestinal:  Negative for abdominal pain, nausea and vomiting.  Genitourinary:  Negative for dysuria.  Musculoskeletal:  Negative for back pain.  Skin:  Negative for rash.  Neurological:  Negative for headaches.   Physical Exam Updated Vital Signs BP (!) 171/112 (BP Location: Left Arm)   Pulse 73   Temp (!) 97.5 F (36.4 C) (Oral)   Resp 16   LMP 11/24/2020   SpO2 100%   Physical Exam Vitals and nursing note reviewed.  Constitutional:      General: She is not in acute distress.    Appearance: She is well-developed.  HENT:     Head: Normocephalic and atraumatic.  Eyes:     Conjunctiva/sclera: Conjunctivae normal.  Cardiovascular:     Rate and Rhythm: Normal rate and regular rhythm.     Heart sounds: Normal heart sounds. No murmur heard. Pulmonary:     Effort: Pulmonary effort is normal. No respiratory distress.     Breath sounds: Normal breath sounds.  Chest:     Chest wall: Tenderness present.     Comments: Patient has reproducible tenderness over her left upper pectoral and shoulder region. Abdominal:     Palpations: Abdomen is soft.     Tenderness: There is no abdominal tenderness.  Musculoskeletal:        General: Normal range of motion.     Cervical back: Neck supple.     Right lower leg: No tenderness.     Left lower leg: No tenderness.  Skin:    General: Skin is warm and dry.  Neurological:     General: No focal deficit present.     Mental Status: She is alert.    ED Results / Procedures / Treatments   Labs (all labs ordered are listed, but only abnormal results are displayed) Labs Reviewed  CBC WITH DIFFERENTIAL/PLATELET - Abnormal; Notable for the following components:      Result Value   RDW 24.6 (*)    Platelets 436 (*)    All other components within normal limits  COMPREHENSIVE  METABOLIC PANEL - Abnormal; Notable for the following components:   Total Protein 9.3 (*)    All other components within normal limits  TROPONIN I (HIGH SENSITIVITY)  TROPONIN I (HIGH SENSITIVITY)    EKG EKG Interpretation  Date/Time:  Tuesday December 23 2020 06:43:22 EDT Ventricular Rate:  75 PR Interval:  139 QRS Duration: 86 QT Interval:  430 QTC Calculation: 481 R Axis:   33 Text Interpretation: Sinus rhythm Consider left ventricular hypertrophy Confirmed by Merrily Pew [phone]) on 12/23/2020 6:58:21 AM  Radiology DG Chest Port 1 View  Result Date: 12/23/2020 CLINICAL DATA:  Shortness of breath, mild chest pain EXAM: PORTABLE CHEST 1 VIEW COMPARISON:  Chest radiograph 10/28/2020 FINDINGS: The cardiomediastinal silhouette is within normal limits. There is no focal consolidation or pulmonary edema. There is no pleural effusion or pneumothorax. There is no acute osseous abnormality. IMPRESSION: No radiographic evidence of acute cardiopulmonary process. Electronically Signed   By: Valetta Mole M.D.   On: 12/23/2020 08:07    Procedures Procedures   Medications Ordered in ED Medications  ketorolac (TORADOL) 30 MG/ML injection 30 mg (30 mg Intramuscular Given 12/23/20 1007)    ED Course  I have reviewed the triage vital signs and the nursing notes.  Pertinent labs & imaging results that were available during my care of the patient were reviewed by me and considered in my medical decision making (see chart for details).  Clinical Course as of 12/23/20 1728  Tue Dec 23, 2020  0836 Chest x-ray ordered and interpreted by me as no acute findings.  Awaiting radiology reading. [MB]  1143 Patient's work-up does not show any acute findings.  Reviewed with patient.  Will provide with prescription for some ibuprofen.  Return instructions discussed [MB]    Clinical Course User Index [MB] Hayden Rasmussen, MD   MDM Rules/Calculators/A&P                          This patient  complains of left-sided chest pain; this involves an extensive number of treatment Options and is a complaint that carries with it a high risk of complications and Morbidity. The differential includes ACS, pneumonia, PE, vascular, musculoskeletal, reflux.  I ordered, reviewed and interpreted labs, which included CBC with normal white count hemoglobin, chemistries normal, LFTs fairly normal, troponins flat I ordered medication IV Toradol with some improvement in her symptoms I ordered imaging studies which included chest x-ray and I independently    visualized and interpreted imaging which showed no acute findings  Previous records obtained and reviewed in epic, has had prior ED visits before for nonspecific chest pain   After the interventions stated above, I reevaluated the patient and found patient to be hemodynamically stable.  Satting well on room air.  Doubt PE.  No evidence of ACS.  Recommended NSAIDs and follow-up PPCP.  Recommended abstinence from crack cocaine.  Return instructions discussed.  I refilled patient's medication for ibuprofen for her symptomatic musculoskeletal pain.  On the discharge diagnosis epic link this to dysmenorrhea which I am not giving her as a diagnosis.  She had no GU complaints.  I am unable to remove this.   Final Clinical Impression(s) / ED Diagnoses Final diagnoses:  Atypical chest pain  Dysmenorrhea    Rx / DC Orders ED Discharge Orders     None        Hayden Rasmussen, MD 12/23/20 1732

## 2020-12-23 NOTE — ED Notes (Signed)
Labeled specimen cup provided to pt for urine collection per MD order. ENMiles 

## 2020-12-23 NOTE — ED Notes (Signed)
Pt advised unable to void at this time, will monitor.

## 2020-12-23 NOTE — ED Triage Notes (Signed)
Pt complains of chest pain and shortness of breath since yesterday. Pt states that she took some aspirin and smoked some weed but it's not helping her pain. Pt states that the pain is on the left side of her chest.

## 2020-12-23 NOTE — Discharge Instructions (Signed)
You were seen in the emergency department for left-sided chest pain.  You had blood work EKG and a chest x-ray that did not show an obvious cause of your symptoms.  This is likely muscular and should respond to ibuprofen.  Follow-up with your regular doctor.  Return to the emergency department if any worsening or concerning symptoms

## 2020-12-25 ENCOUNTER — Ambulatory Visit: Payer: Medicaid Other | Admitting: Family Medicine

## 2021-02-03 ENCOUNTER — Emergency Department (HOSPITAL_COMMUNITY)
Admission: EM | Admit: 2021-02-03 | Discharge: 2021-02-05 | Payer: Medicaid Other | Attending: Student | Admitting: Student

## 2021-02-03 ENCOUNTER — Encounter (HOSPITAL_COMMUNITY): Payer: Self-pay | Admitting: Emergency Medicine

## 2021-02-03 ENCOUNTER — Other Ambulatory Visit: Payer: Self-pay

## 2021-02-03 DIAGNOSIS — F17210 Nicotine dependence, cigarettes, uncomplicated: Secondary | ICD-10-CM | POA: Insufficient documentation

## 2021-02-03 DIAGNOSIS — F1490 Cocaine use, unspecified, uncomplicated: Secondary | ICD-10-CM | POA: Diagnosis not present

## 2021-02-03 DIAGNOSIS — Y900 Blood alcohol level of less than 20 mg/100 ml: Secondary | ICD-10-CM | POA: Insufficient documentation

## 2021-02-03 DIAGNOSIS — R440 Auditory hallucinations: Secondary | ICD-10-CM | POA: Insufficient documentation

## 2021-02-03 DIAGNOSIS — F1910 Other psychoactive substance abuse, uncomplicated: Secondary | ICD-10-CM | POA: Diagnosis not present

## 2021-02-03 DIAGNOSIS — Z20822 Contact with and (suspected) exposure to covid-19: Secondary | ICD-10-CM | POA: Insufficient documentation

## 2021-02-03 DIAGNOSIS — F1099 Alcohol use, unspecified with unspecified alcohol-induced disorder: Secondary | ICD-10-CM | POA: Diagnosis not present

## 2021-02-03 DIAGNOSIS — F1290 Cannabis use, unspecified, uncomplicated: Secondary | ICD-10-CM | POA: Insufficient documentation

## 2021-02-03 DIAGNOSIS — R45851 Suicidal ideations: Secondary | ICD-10-CM | POA: Insufficient documentation

## 2021-02-03 DIAGNOSIS — F32A Depression, unspecified: Secondary | ICD-10-CM | POA: Diagnosis present

## 2021-02-03 HISTORY — DX: Depression, unspecified: F32.A

## 2021-02-03 LAB — CBC WITH DIFFERENTIAL/PLATELET
Abs Immature Granulocytes: 0.02 10*3/uL (ref 0.00–0.07)
Basophils Absolute: 0.1 10*3/uL (ref 0.0–0.1)
Basophils Relative: 1 %
Eosinophils Absolute: 0.2 10*3/uL (ref 0.0–0.5)
Eosinophils Relative: 3 %
HCT: 43.3 % (ref 36.0–46.0)
Hemoglobin: 14 g/dL (ref 12.0–15.0)
Immature Granulocytes: 0 %
Lymphocytes Relative: 33 %
Lymphs Abs: 2.4 10*3/uL (ref 0.7–4.0)
MCH: 29.6 pg (ref 26.0–34.0)
MCHC: 32.3 g/dL (ref 30.0–36.0)
MCV: 91.5 fL (ref 80.0–100.0)
Monocytes Absolute: 0.6 10*3/uL (ref 0.1–1.0)
Monocytes Relative: 9 %
Neutro Abs: 3.9 10*3/uL (ref 1.7–7.7)
Neutrophils Relative %: 54 %
Platelets: 334 10*3/uL (ref 150–400)
RBC: 4.73 MIL/uL (ref 3.87–5.11)
RDW: 15.8 % — ABNORMAL HIGH (ref 11.5–15.5)
WBC: 7.2 10*3/uL (ref 4.0–10.5)
nRBC: 0 % (ref 0.0–0.2)

## 2021-02-03 LAB — RAPID URINE DRUG SCREEN, HOSP PERFORMED
Amphetamines: NOT DETECTED
Barbiturates: NOT DETECTED
Benzodiazepines: NOT DETECTED
Cocaine: POSITIVE — AB
Opiates: NOT DETECTED
Tetrahydrocannabinol: POSITIVE — AB

## 2021-02-03 LAB — COMPREHENSIVE METABOLIC PANEL
ALT: 18 U/L (ref 0–44)
AST: 23 U/L (ref 15–41)
Albumin: 3.9 g/dL (ref 3.5–5.0)
Alkaline Phosphatase: 90 U/L (ref 38–126)
Anion gap: 11 (ref 5–15)
BUN: 14 mg/dL (ref 6–20)
CO2: 25 mmol/L (ref 22–32)
Calcium: 9.7 mg/dL (ref 8.9–10.3)
Chloride: 103 mmol/L (ref 98–111)
Creatinine, Ser: 0.79 mg/dL (ref 0.44–1.00)
GFR, Estimated: >60 mL/min (ref >60)
Glucose, Bld: 132 mg/dL — ABNORMAL HIGH (ref 70–99)
Potassium: 3.5 mmol/L (ref 3.5–5.1)
Sodium: 139 mmol/L (ref 135–145)
Total Bilirubin: 0.4 mg/dL (ref 0.3–1.2)
Total Protein: 7.8 g/dL (ref 6.5–8.1)

## 2021-02-03 LAB — RESP PANEL BY RT-PCR (FLU A&B, COVID) ARPGX2
Influenza A by PCR: NEGATIVE
Influenza B by PCR: NEGATIVE
SARS Coronavirus 2 by RT PCR: NEGATIVE

## 2021-02-03 LAB — ACETAMINOPHEN LEVEL: Acetaminophen (Tylenol), Serum: <10 ug/mL — ABNORMAL LOW (ref 10–30)

## 2021-02-03 LAB — I-STAT BETA HCG BLOOD, ED (MC, WL, AP ONLY): I-stat hCG, quantitative: <5 m[IU]/mL (ref <5)

## 2021-02-03 LAB — SALICYLATE LEVEL: Salicylate Lvl: <7 mg/dL — ABNORMAL LOW (ref 7.0–30.0)

## 2021-02-03 LAB — ETHANOL: Alcohol, Ethyl (B): <10 mg/dL (ref <10)

## 2021-02-03 NOTE — ED Triage Notes (Signed)
Pt here for increased depression, states she stopped her meds a month ago due to running out and no refills. Pt states she has been drinking alcohol, smoking "weed and crack", wanting detox so she can get into [facility]. Denies SI/HI.

## 2021-02-03 NOTE — ED Provider Notes (Signed)
Emergency Medicine Provider Triage Evaluation Note  Joanne Gonzalez , a 55 y.o. female  was evaluated in triage.  Pt complains of depression and suicidal thoughts.  She states that she has been progressively more depressed over the past month.  She states that she stopped taking her meds after she ran out of them.  She states that she has been drinking alcohol, smoking crack and marijuana daily.  She states that she is initially here for detox.  I instructed her that we do not do detox here in the she states that she is having suicidal thoughts that she is going to "do something."  She states "if I walk out of the store I am not trying to be here anymore."  She denies homicidal ideations, AVH.  Review of Systems  Positive: Suicidal ideations Negative: Homicidal ideations  Physical Exam  BP (!) 142/85   Pulse 77   Temp 98.4 F (36.9 C) (Oral)   Resp 16   LMP 01/14/2021 (Exact Date)   SpO2 98%  Gen:   Awake, no distress   Resp:  Normal effort, clear to auscultation MSK:   Moves extremities without difficulty  Other:  Poor dentition with multiple dental caries and decay, S1/S2 without murmur  Medical Decision Making  Medically screening exam initiated at 9:55 PM.  Appropriate orders placed.  Joanne Gonzalez was informed that the remainder of the evaluation will be completed by another provider, this initial triage assessment does not replace that evaluation, and the importance of remaining in the ED until their evaluation is complete.     Mickie Hillier, PA-C 02/03/21 2157    Godfrey Pick, MD 02/04/21 [phone]

## 2021-02-03 NOTE — ED Provider Notes (Signed)
[facility] EMERGENCY DEPARTMENT Provider Note   CSN: [id number] Arrival date & time: 02/03/21  2008     History Chief Complaint  Patient presents with   Depression   detox    Joanne Gonzalez is a 55 y.o. female.  Patient to ED reporting alcohol, crack and marijuana use, now suicidal. She reports being out of her medications. No fever, vomiting. No intentional self injury prior to arrival. "You better help me or I'm going to check out". Reports auditory hallucinations.  The history is provided by the patient. No language interpreter was used.  Depression      Past Medical History:  Diagnosis Date   CVA (cerebral infarction)    Depression    Schizophrenia ([place])    Seizures ([place])     Patient Active Problem List   Diagnosis Date Noted   Dysmenorrhea 11/25/2020   Psychophysiological insomnia 11/11/2020   Elevated LDL cholesterol level 11/11/2020   Hypokalemia 11/11/2020   Chronic pain of both hips 11/11/2020   Vitamin D deficiency 11/11/2020   Trichimoniasis 11/11/2020   Stroke (cerebrum) ([place]) 02/10/2019   Hypocalcemia 02/10/2019   History of seizures 02/10/2019   Homelessness 11/26/2018   Adjustment disorder with depressed mood 11/26/2018   Polysubstance abuse ([place])    Iron deficiency anemia 09/01/2014   Cocaine use 09/01/2014   Hyperglycemia 09/01/2014   Schizophrenia, paranoid ([place]) 08/31/2014   Left-sided weakness 08/30/2014   Hemiplegia, unspecified, affecting nondominant side 04/13/2013    Past Surgical History:  Procedure Laterality Date   LIMB SPARING RESECTION HIP W/ SADDLE JOINT REPLACEMENT       OB History   No obstetric history on file.     Family History  Problem Relation Age of Onset   Seizures Mother    Hypertension Mother     Social History   Tobacco Use   Smoking status: Every Day    Packs/day: 1.00    Years: 5.00    Pack years: 5.00    Types: Cigarettes   Smokeless tobacco: Never  Substance Use Topics    Alcohol use: Yes    Comment: a drink a day   Drug use: Yes    Types: Cocaine, Marijuana    Comment: every day    Home Medications Prior to Admission medications   Medication Sig Start Date End Date Taking? Authorizing Provider  ibuprofen (ADVIL) 800 MG tablet Take 1 tablet (800 mg total) by mouth every 8 (eight) hours as needed. 12/23/20   Hayden Rasmussen, MD  Iron, Ferrous Sulfate, 325 (65 Fe) MG TABS Take 325 mg by mouth daily. Increase to BID in one week if tolerated 11/11/20   Mayers, Cari S, PA-C  QUEtiapine (SEROQUEL) 100 MG tablet Take 1 tablet (100 mg total) by mouth at bedtime. 11/10/20   Mayers, Cari S, PA-C  traZODone (DESYREL) 100 MG tablet Take 1/2 - 1 full tablet PO QHS PRN 11/10/20   Mayers, Cari S, PA-C  Vitamin D, Ergocalciferol, (DRISDOL) 1.25 MG (50000 UNIT) CAPS capsule Take 1 capsule (50,000 Units total) by mouth every 7 (seven) days. 11/11/20   Mayers, Cari S, PA-C  sertraline (ZOLOFT) 50 MG tablet Take 1 tablet (50 mg total) by mouth daily. Patient not taking: Reported on 03/14/2015 09/02/14 03/14/15  Luan Moore, MD    Allergies    Keflex [cephalexin], Penicillins, Bactrim [sulfamethoxazole-trimethoprim], and Lactose intolerance (gi)  Review of Systems   Review of Systems  Constitutional:  Negative for chills and fever.  HENT: Negative.  Respiratory: Negative.    Cardiovascular: Negative.   Gastrointestinal: Negative.   Musculoskeletal: Negative.   Skin: Negative.   Neurological: Negative.   Psychiatric/Behavioral:  Positive for depression and suicidal ideas.    Physical Exam Updated Vital Signs BP (!) 142/85   Pulse 77   Temp 98.4 F (36.9 C) (Oral)   Resp 16   LMP 01/14/2021 (Exact Date)   SpO2 98%   Physical Exam Vitals and nursing note reviewed.  Constitutional:      General: She is not in acute distress.    Appearance: She is well-developed. She is not ill-appearing.  Pulmonary:     Effort: Pulmonary effort is normal.  Musculoskeletal:         General: Normal range of motion.     Cervical back: Normal range of motion.  Skin:    General: Skin is warm and dry.  Neurological:     Mental Status: She is alert and oriented to person, place, and time.  Psychiatric:        Attention and Perception: She perceives auditory hallucinations.        Mood and Affect: Affect is angry.        Speech: Speech is delayed.        Behavior: Behavior is slowed.        Thought Content: Thought content includes suicidal ideation.    ED Results / Procedures / Treatments   Labs (all labs ordered are listed, but only abnormal results are displayed) Labs Reviewed  COMPREHENSIVE METABOLIC PANEL - Abnormal; Notable for the following components:      Result Value   Glucose, Bld 132 (*)    All other components within normal limits  RAPID URINE DRUG SCREEN, HOSP PERFORMED - Abnormal; Notable for the following components:   Cocaine POSITIVE (*)    Tetrahydrocannabinol POSITIVE (*)    All other components within normal limits  CBC WITH DIFFERENTIAL/PLATELET - Abnormal; Notable for the following components:   RDW 15.8 (*)    All other components within normal limits  SALICYLATE LEVEL - Abnormal; Notable for the following components:   Salicylate Lvl <7.9 (*)    All other components within normal limits  ACETAMINOPHEN LEVEL - Abnormal; Notable for the following components:   Acetaminophen (Tylenol), Serum <10 (*)    All other components within normal limits  RESP PANEL BY RT-PCR (FLU A&B, COVID) ARPGX2  ETHANOL  I-STAT BETA HCG BLOOD, ED ([facility], [facility], [facility] ONLY)    EKG None  Radiology No results found.  Procedures Procedures   Medications Ordered in ED Medications - No data to display  ED Course  I have reviewed the triage vital signs and the nursing notes.  Pertinent labs & imaging results that were available during my care of the patient were reviewed by me and considered in my medical decision making (see chart for details).    MDM  Rules/Calculators/A&P                           Patient to ED for detox, SI, AH, history of schizophrenia, substance abuse.  Final Clinical Impression(s) / ED Diagnoses Final diagnoses:  None   Substance abuse SI AH  Rx / DC Orders ED Discharge Orders     None        Dennie Bible 02/03/21 [place], [place], DO 02/03/21 2320

## 2021-02-04 NOTE — ED Notes (Signed)
Patient belongs in locker #6

## 2021-02-04 NOTE — BH Assessment (Signed)
Clinician attempted to make contact with pt's team at 2259 via internal messenger in an effort to complete pt's [place] Assessment. As of 2315 clinician did not receive a response so informed the team that she would be moving on to the next pt. Clinician requested the team inform her at a later time if pt was ready to be seen.

## 2021-02-04 NOTE — ED Notes (Signed)
Pt given fan due to room being too hot.

## 2021-02-05 ENCOUNTER — Other Ambulatory Visit (HOSPITAL_COMMUNITY)
Admission: EM | Admit: 2021-02-05 | Discharge: 2021-02-09 | Disposition: A | Discharge status: Rehabilitation Facility | Payer: Medicaid Other | Attending: Psychiatry | Admitting: Psychiatry

## 2021-02-05 DIAGNOSIS — F1290 Cannabis use, unspecified, uncomplicated: Secondary | ICD-10-CM | POA: Insufficient documentation

## 2021-02-05 DIAGNOSIS — F1994 Other psychoactive substance use, unspecified with psychoactive substance-induced mood disorder: Secondary | ICD-10-CM

## 2021-02-05 DIAGNOSIS — Z79899 Other long term (current) drug therapy: Secondary | ICD-10-CM | POA: Insufficient documentation

## 2021-02-05 DIAGNOSIS — F1490 Cocaine use, unspecified, uncomplicated: Secondary | ICD-10-CM | POA: Diagnosis not present

## 2021-02-05 DIAGNOSIS — F200 Paranoid schizophrenia: Secondary | ICD-10-CM

## 2021-02-05 DIAGNOSIS — F1090 Alcohol use, unspecified, uncomplicated: Secondary | ICD-10-CM | POA: Insufficient documentation

## 2021-02-05 DIAGNOSIS — F17210 Nicotine dependence, cigarettes, uncomplicated: Secondary | ICD-10-CM | POA: Insufficient documentation

## 2021-02-05 MED ORDER — ACETAMINOPHEN 325 MG PO TABS
650.0000 mg | ORAL_TABLET | Freq: Four times a day (QID) | ORAL | Status: DC | PRN
  Administered 2021-02-07 – 2021-02-09 (×4): 650 mg via ORAL
  Filled 2021-02-05 (×4): qty 2

## 2021-02-05 MED ORDER — QUETIAPINE FUMARATE 50 MG PO TABS
50.0000 mg | ORAL_TABLET | Freq: Every day | ORAL | Status: DC
  Administered 2021-02-05: 50 mg via ORAL
  Filled 2021-02-05: qty 1

## 2021-02-05 MED ORDER — TRAZODONE HCL 50 MG PO TABS
50.0000 mg | ORAL_TABLET | Freq: Every evening | ORAL | Status: DC | PRN
  Filled 2021-02-05: qty 14
  Filled 2021-02-05: qty 1

## 2021-02-05 MED ORDER — ALUM & MAG HYDROXIDE-SIMETH 200-200-20 MG/5ML PO SUSP: 30.0000 mL | ORAL | Status: DC | PRN

## 2021-02-05 MED ORDER — HYDROXYZINE HCL 25 MG PO TABS
25.0000 mg | ORAL_TABLET | Freq: Three times a day (TID) | ORAL | Status: DC | PRN
  Filled 2021-02-05: qty 20

## 2021-02-05 MED ORDER — MAGNESIUM HYDROXIDE 400 MG/5ML PO SUSP: 30.0000 mL | Freq: Every day | ORAL | Status: DC | PRN

## 2021-02-05 NOTE — ED Notes (Signed)
Patient denies [place].  Patient is cooperative and interacts well with staff. Respiratory is even and unlabored. No distress noted. Patient resting in bed at present. Patient stated no complaints at present. will continue to monitor for safety.

## 2021-02-05 NOTE — ED Notes (Signed)
Pt asleep in bed. Respirations even and unlabored. Will continue to monitor for safety. ?

## 2021-02-05 NOTE — Progress Notes (Signed)
CSW submitted requested documentation via fax to [phone]. 2nd shift disposition CSW to follow up regarding confirmation information was received.   Mariea Clonts, MSW, LCSW-A  2:55 PM 02/05/2021
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
## 2021-02-05 NOTE — ED Notes (Addendum)
Pt stated she takes 100 mg of Seroquel instead 50 mg at bed time. Leandro Reasoner NP. Notified. Np advised to make day shift staff aware.

## 2021-02-05 NOTE — ED Provider Notes (Signed)
Emergency Medicine Observation Re-evaluation Note  Joanne Gonzalez is a 55 y.o. female, seen on rounds today.  Pt initially presented to the ED for complaints of Depression and detox Currently, the patient is resting.  Physical Exam  BP 137/79 (BP Location: Left Arm)   Pulse 65   Temp (!) 97.4 F (36.3 C)   Resp 13   LMP 01/14/2021 (Exact Date)   SpO2 96%  Physical Exam General: resting comfortably, NAD Lungs: normal WOB Psych: currently calm and resting  ED Course / MDM  EKG:   I have reviewed the labs performed to date as well as medications administered while in observation.  Recent changes in the last 24 hours include none.  Plan  Current plan is for psychiatric evaluation and recommendations.  Terri Rorrer is not under involuntary commitment.     Lorelle Gibbs, [place] 02/05/21 [phone]

## 2021-02-05 NOTE — ED Provider Notes (Signed)
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 Final   Platelets 09/26/2020 394  150 - 400 K/uL Final   nRBC 09/26/2020 0.0  0.0 - 0.2 % Final   Neutrophils Relative % 09/26/2020 65  % Final   Neutro Abs 09/26/2020 4.0  1.7 - 7.7 K/uL Final   Lymphocytes Relative 09/26/2020 25  % Final   Lymphs Abs 09/26/2020 1.5  0.7 - 4.0 K/uL Final   Monocytes Relative 09/26/2020 8  % Final   Monocytes Absolute 09/26/2020 0.5  0.1 - 1.0 K/uL  Final   Eosinophils Relative 09/26/2020 1  % Final   Eosinophils Absolute 09/26/2020 0.1  0.0 - 0.5 K/uL Final   Basophils Relative 09/26/2020 1  % Final   Basophils Absolute 09/26/2020 0.1  0.0 - 0.1 K/uL Final   Immature Granulocytes 09/26/2020 0  % Final   Abs Immature Granulocytes 09/26/2020 0.02  0.00 - 0.07 K/uL Final   Performed at [facility], [place] [address] [place], [place] [address]   Troponin I (High Sensitivity) 09/26/2020 10  <18 ng/L Final   Comment: (NOTE) Elevated high sensitivity troponin I (hsTnI) values and significant  changes across serial measurements may suggest ACS but many other  chronic and acute conditions are known to elevate hsTnI results.  Refer to the "Links" section for chest pain algorithms and additional  guidance. Performed at [facility], [place] [address], [place], [place] [address]   Admission on 08/16/2020, Discharged on 08/16/2020  Component Date Value Ref Range Status   TSH 08/16/2020 0.969  0.350 - 4.500 uIU/mL Final   Comment: Performed by a 3rd Generation assay with a functional sensitivity of <=0.01 uIU/mL. Performed at [facility], [place] [address], [place], [place] [address]   Admission on 08/15/2020, Discharged on 08/16/2020  Component Date Value Ref Range Status   Sodium 08/15/2020 135  135 - 145 mmol/L Final   Potassium 08/15/2020 3.7  3.5 - 5.1 mmol/L Final   Chloride 08/15/2020 103  98 - 111 mmol/L Final   CO2 08/15/2020 20 (A)  22 - 32 mmol/L Final   Glucose, Bld 08/15/2020 93  70 - 99 mg/dL Final   Glucose reference range applies only to samples taken after fasting for at least 8 hours.   BUN 08/15/2020 8  6 - 20 mg/dL Final   Creatinine, Ser 08/15/2020 0.88  0.44 - 1.00 mg/dL Final   Calcium 08/15/2020 9.7  8.9 - 10.3 mg/dL Final   Total Protein 08/15/2020 8.5 (A)  6.5 - 8.1 g/dL Final   Albumin 08/15/2020 4.4  3.5 - 5.0 g/dL Final   AST 08/15/2020 26  15 - 41 U/L Final   ALT 08/15/2020 19  0 - 44 U/L Final    Alkaline Phosphatase 08/15/2020 86  38 - 126 U/L Final   Total Bilirubin 08/15/2020 0.1 (A)  0.3 - 1.2 mg/dL Final   GFR, Estimated 08/15/2020 >60  >60 mL/min Final   Comment: (NOTE) Calculated using the CKD-EPI Creatinine Equation (2021)    Anion gap 08/15/2020 12  5 - 15 Final   Performed at [place] [address], [place], [place] [address]   Alcohol, Ethyl (B) 08/15/2020 <10  <10 mg/dL Final   Comment: (NOTE) Lowest detectable limit for serum alcohol is 10 mg/dL.  For medical purposes only. Performed at [facility], [place] [address] [place], [place] [address]    Salicylate Lvl 50/05/7046 <7.0 (A)  7.0 - 30.0 mg/dL Final  Performed at [facility], [place] [address], [place], [place] [address]   Acetaminophen (Tylenol), Serum 08/15/2020 <10 (A)  10 - 30 ug/mL Final   Comment: (NOTE) Therapeutic concentrations vary significantly. A range of 10-30 ug/mL  may be an effective concentration for many patients. However, some  are best treated at concentrations outside of this range. Acetaminophen concentrations >150 ug/mL at 4 hours after ingestion  and >50 ug/mL at 12 hours after ingestion are often associated with  toxic reactions.  Performed at [facility], [place] [name] [place], [place] [address]    WBC 08/15/2020 9.9  4.0 - 10.5 K/uL Final   RBC 08/15/2020 3.98  3.87 - 5.11 MIL/uL Final   Hemoglobin 08/15/2020 9.2 (A)  12.0 - 15.0 g/dL Final   HCT 08/15/2020 31.4 (A)  36.0 - 46.0 % Final   MCV 08/15/2020 78.9 (A)  80.0 - 100.0 fL Final   MCH 08/15/2020 23.1 (A)  26.0 - 34.0 pg Final   MCHC 08/15/2020 29.3 (A)  30.0 - 36.0 g/dL Final   RDW 08/15/2020 16.3 (A)  11.5 - 15.5 % Final   Platelets 08/15/2020 273  150 - 400 K/uL Final   nRBC 08/15/2020 0.0  0.0 - 0.2 % Final   Performed at [facility], [place] [address] [place], [place] [address]   Opiates 08/15/2020 NONE DETECTED  NONE DETECTED Final   Cocaine 08/15/2020 POSITIVE (A)  NONE  DETECTED Final   Benzodiazepines 08/15/2020 NONE DETECTED  NONE DETECTED Final   Amphetamines 08/15/2020 NONE DETECTED  NONE DETECTED Final   Tetrahydrocannabinol 08/15/2020 NONE DETECTED  NONE DETECTED Final   Barbiturates 08/15/2020 NONE DETECTED  NONE DETECTED Final   Comment: (NOTE) DRUG SCREEN FOR MEDICAL PURPOSES ONLY.  IF CONFIRMATION IS NEEDED FOR ANY PURPOSE, NOTIFY LAB WITHIN 5 DAYS.  LOWEST DETECTABLE LIMITS FOR URINE DRUG SCREEN Drug Class                     Cutoff (ng/mL) Amphetamine and metabolites    1000 Barbiturate and metabolites    200 Benzodiazepine                 950 Tricyclics and metabolites     300 Opiates and metabolites        300 Cocaine and metabolites        300 THC                            50 Performed at [facility], [place] [address] [place],  [address]    I-stat hCG, quantitative 08/15/2020 <5.0  <5 mIU/mL Final   Comment 3 08/15/2020          Final   Comment:   GEST. AGE      CONC.  (mIU/mL)   <=1 WEEK        5 - 50     2 WEEKS       50 - 500     3 WEEKS       100 - 10,000     4 WEEKS     1,000 - 30,000        FEMALE AND NON-PREGNANT FEMALE:     LESS THAN 5 mIU/mL    SARS Coronavirus 2 by RT PCR 08/16/2020 NEGATIVE  NEGATIVE Final   Comment: (NOTE) SARS-CoV-2 target nucleic acids are NOT DETECTED.  The SARS-CoV-2 RNA is  generally detectable in upper respiratory specimens during the acute phase of infection. The lowest concentration of SARS-CoV-2 viral copies this assay can detect is 138 copies/mL. A negative result does not preclude SARS-Cov-2 infection and should not be used as the sole basis for treatment or other patient management decisions. A negative result may occur with  improper specimen collection/handling, submission of specimen other than nasopharyngeal swab, presence of viral mutation(s) within the areas targeted by this assay, and inadequate number of viral copies(<138 copies/mL). A negative result must be  combined with clinical observations, patient history, and epidemiological information. The expected result is Negative.  Fact Sheet for Patients:  EntrepreneurPulse.com.au  Fact Sheet for Healthcare Providers:  IncredibleEmployment.be  This test is no                          t yet approved or cleared by the [place] FDA and  has been authorized for detection and/or diagnosis of SARS-CoV-2 by FDA under an Emergency Use Authorization (EUA). This EUA will remain  in effect (meaning this test can be used) for the duration of the COVID-19 declaration under Section 564(b)(1) of the Act, 21 U.S.C.section 360bbb-3(b)(1), unless the authorization is terminated  or revoked sooner.       Influenza A by PCR 08/16/2020 NEGATIVE  NEGATIVE Final   Influenza B by PCR 08/16/2020 NEGATIVE  NEGATIVE Final   Comment: (NOTE) The Xpert Xpress SARS-CoV-2/FLU/RSV plus assay is intended as an aid in the diagnosis of influenza from Nasopharyngeal swab specimens and should not be used as a sole basis for treatment. Nasal washings and aspirates are unacceptable for Xpert Xpress SARS-CoV-2/FLU/RSV testing.  Fact Sheet for Patients: EntrepreneurPulse.com.au  Fact Sheet for Healthcare Providers: IncredibleEmployment.be  This test is not yet approved or cleared by the [place] FDA and has been authorized for detection and/or diagnosis of SARS-CoV-2 by FDA under an Emergency Use Authorization (EUA). This EUA will remain in effect (meaning this test can be used) for the duration of the COVID-19 declaration under Section 564(b)(1) of the Act, 21 U.S.C. section 360bbb-3(b)(1), unless the authorization is terminated or revoked.  Performed at [facility], [place] [address] [place], [place] [address]    Cholesterol 08/15/2020 196  0 - 200 mg/dL Final   Triglycerides 08/15/2020 137  <150 mg/dL Final   HDL 08/15/2020 58   >40 mg/dL Final   Total CHOL/HDL Ratio 08/15/2020 3.4  RATIO Final   VLDL 08/15/2020 27  0 - 40 mg/dL Final   LDL Cholesterol 08/15/2020 111 (A)  0 - 99 mg/dL Final   Comment:        Total Cholesterol/HDL:CHD Risk Coronary Heart Disease Risk Table                     Men   Women  1/2 Average Risk   3.4   3.3  Average Risk       5.0   4.4  2 X Average Risk   9.6   7.1  3 X Average Risk  23.4   11.0        Use the calculated Patient Ratio above and the CHD Risk Table to determine the patient's CHD Risk.        ATP III CLASSIFICATION (LDL):  <100     mg/dL   Optimal  100-129  mg/dL   Near or Above  Optimal  130-159  mg/dL   Borderline  160-189  mg/dL   High  >190     mg/dL   Very High Performed at [place] [address] [place], [place] [address]    Hgb A1c MFr Bld 08/15/2020 6.2 (A)  4.8 - 5.6 % Final   Comment: (NOTE) Pre diabetes:          5.7%-6.4%  Diabetes:              >6.4%  Glycemic control for   <7.0% adults with diabetes    Mean Plasma Glucose 08/15/2020 131.24  mg/dL Final   Performed at [facility], [place] [address], [place], [place] [address]    Allergies: Keflex [cephalexin], Penicillins, Bactrim [sulfamethoxazole-trimethoprim], and Lactose intolerance (gi)  PTA Medications: (Not in a hospital admission)   Medical Decision Making  Patient admitted to the [facility] behavioral health facility based crisis for mood stabilization and safety.   Labs reviewed.  EKG ordered.  Medications:  Meds ordered this encounter  Medications   acetaminophen (TYLENOL) tablet 650 mg   alum & mag hydroxide-simeth (MAALOX/MYLANTA) 200-200-20 MG/5ML suspension 30 mL   magnesium hydroxide (MILK OF MAGNESIA) suspension 30 mL   hydrOXYzine (ATARAX/VISTARIL) tablet 25 mg   traZODone (DESYREL) tablet 50 mg   QUEtiapine (SEROQUEL) tablet 50 mg  Added-Seroquel 50 mg po QHS.    Discharge plan: Patient has been accepted to [facility]. Patient can not be admitted until Monday November 14 at 0900. Patient needs to arrive with a 30 day supply of medications and 1 month worth of refills. If the patient is a smoker, a nicotine patch prescription is also needed.   Recommendations  Based on my evaluation the patient does not appear to have an emergency medical condition.  Marissa Calamity, NP 02/05/21  5:51 PM

## 2021-02-05 NOTE — Progress Notes (Signed)
Patient accepted to the [facility] Based Crisis unit to await substance abuse treatment at [facility] on Monday. Call report to [phone].

## 2021-02-05 NOTE — ED Notes (Addendum)
Per Mariea Clonts, LCSW "Patient can come to [facility] on Monday by 9 AM. They need 30 day supply of medications and 1 month prescription."

## 2021-02-05 NOTE — ED Notes (Signed)
Messaged counselor back about pt TTS -  awaiting response at this time
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
## 2021-02-05 NOTE — Progress Notes (Signed)
Patient has been accepted to [facility]. Patient can not be admitted until Monday November 14 at 0900. Patient needs to arrive with a 30 day supply of medications and 1 month worth of refills. If the patient is a smoker, a nicotine patch prescription is also needed. CSW agreed to fax a intake referral to [facility] for review at [phone] to [place] [facility]. 2nd shift CSW to follow up with [facility] to see if the Pt can remain there until Monday.    Mariea Clonts, MSW, LCSW-A  1:47 PM 02/05/2021

## 2021-02-06 ENCOUNTER — Encounter (HOSPITAL_COMMUNITY): Payer: Self-pay | Admitting: Psychiatry

## 2021-02-06 DIAGNOSIS — F1090 Alcohol use, unspecified, uncomplicated: Secondary | ICD-10-CM | POA: Diagnosis not present

## 2021-02-06 DIAGNOSIS — F1490 Cocaine use, unspecified, uncomplicated: Secondary | ICD-10-CM | POA: Diagnosis not present

## 2021-02-06 DIAGNOSIS — F1290 Cannabis use, unspecified, uncomplicated: Secondary | ICD-10-CM | POA: Diagnosis not present

## 2021-02-06 DIAGNOSIS — F1994 Other psychoactive substance use, unspecified with psychoactive substance-induced mood disorder: Secondary | ICD-10-CM | POA: Diagnosis not present

## 2021-02-06 MED ORDER — QUETIAPINE FUMARATE 100 MG PO TABS
100.0000 mg | ORAL_TABLET | Freq: Every day | ORAL | Status: DC
  Administered 2021-02-06 – 2021-02-08 (×3): 100 mg via ORAL
  Filled 2021-02-06: qty 14
  Filled 2021-02-06 (×2): qty 1
  Filled 2021-02-06: qty 14
  Filled 2021-02-06: qty 1
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
## 2021-02-06 NOTE — ED Notes (Signed)
Pt eating dinner

## 2021-02-06 NOTE — ED Provider Notes (Signed)
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 09/26/2020 8.6 (A)  12.0 - 15.0  g/dL Final   Comment: Reticulocyte Hemoglobin testing may be clinically indicated, consider ordering this additional test [id number]    HCT 09/26/2020 29.5 (A)  36.0 - 46.0 % Final   MCV 09/26/2020 74.1 (A)  80.0 - 100.0 fL Final   MCH 09/26/2020 21.6 (A)  26.0 - 34.0 pg Final   MCHC 09/26/2020 29.2 (A)  30.0 - 36.0 g/dL Final   RDW 09/26/2020 16.1 (A)  11.5 - 15.5 % Final   Platelets 09/26/2020 394  150 - 400 K/uL Final   nRBC 09/26/2020 0.0  0.0 - 0.2 % Final   Neutrophils Relative %  09/26/2020 65  % Final   Neutro Abs 09/26/2020 4.0  1.7 - 7.7 K/uL Final   Lymphocytes Relative 09/26/2020 25  % Final   Lymphs Abs 09/26/2020 1.5  0.7 - 4.0 K/uL Final   Monocytes Relative 09/26/2020 8  % Final   Monocytes Absolute 09/26/2020 0.5  0.1 - 1.0 K/uL Final   Eosinophils Relative 09/26/2020 1  % Final   Eosinophils Absolute 09/26/2020 0.1  0.0 - 0.5 K/uL Final   Basophils Relative 09/26/2020 1  % Final   Basophils Absolute 09/26/2020 0.1  0.0 - 0.1 K/uL Final   Immature Granulocytes 09/26/2020 0  % Final   Abs Immature Granulocytes 09/26/2020 0.02  0.00 - 0.07 K/uL Final   Performed at [facility], [place] [address] [place], [place] [address]   Troponin I (High Sensitivity) 09/26/2020 10  <18 ng/L Final   Comment: (NOTE) Elevated high sensitivity troponin I (hsTnI) values and significant  changes across serial measurements may suggest ACS but many other  chronic and acute conditions are known to elevate hsTnI results.  Refer to the "Links" section for chest pain algorithms and additional  guidance. Performed at [facility], [place] [address] [place], [place] [address]   Admission on 08/16/2020, Discharged on 08/16/2020  Component Date Value Ref Range Status   TSH 08/16/2020 0.969  0.350 - 4.500 uIU/mL Final   Comment: Performed by a 3rd Generation assay with a functional sensitivity of <=0.01 uIU/mL. Performed at [facility], [place] [address], [place],  [address]   Admission on 08/15/2020, Discharged on 08/16/2020  Component Date Value Ref Range Status   Sodium 08/15/2020 135  135 - 145 mmol/L Final   Potassium 08/15/2020 3.7  3.5 - 5.1 mmol/L Final   Chloride 08/15/2020 103  98 - 111 mmol/L Final   CO2 08/15/2020 20 (A)  22 - 32 mmol/L Final   Glucose, Bld 08/15/2020 93  70 - 99 mg/dL Final   Glucose reference range applies only to samples taken after fasting for at least 8 hours.   BUN 08/15/2020 8  6 - 20 mg/dL Final   Creatinine, Ser  08/15/2020 0.88  0.44 - 1.00 mg/dL Final   Calcium 08/15/2020 9.7  8.9 - 10.3 mg/dL Final   Total Protein 08/15/2020 8.5 (A)  6.5 - 8.1 g/dL Final   Albumin 08/15/2020 4.4  3.5 - 5.0 g/dL Final   AST 08/15/2020 26  15 - 41 U/L Final   ALT 08/15/2020 19  0 - 44 U/L Final   Alkaline Phosphatase 08/15/2020 86  38 - 126 U/L Final   Total Bilirubin 08/15/2020 0.1 (A)  0.3 - 1.2 mg/dL Final   GFR, Estimated 08/15/2020 >60  >60 mL/min Final   Comment: (NOTE) Calculated using the CKD-EPI Creatinine Equation (2021)    Anion gap 08/15/2020 12  5 -  15 Final   Performed at [facility], [place] [address], [place], [place] [address]   Alcohol, Ethyl (B) 08/15/2020 <10  <10 mg/dL Final   Comment: (NOTE) Lowest detectable limit for serum alcohol is 10 mg/dL.  For medical purposes only. Performed at [facility], [place] [address], [place], [place] [address]    Salicylate Lvl 09/81/1914 <7.0 (A)  7.0 - 30.0 mg/dL Final   Performed at [place] [address], [place], [place] [address]   Acetaminophen (Tylenol), Serum 08/15/2020 <10 (A)  10 - 30 ug/mL Final   Comment: (NOTE) Therapeutic concentrations vary significantly. A range of 10-30 ug/mL  may be an effective concentration for many patients. However, some  are best treated at concentrations outside of this range. Acetaminophen concentrations >150 ug/mL at 4 hours after ingestion  and >50 ug/mL at 12 hours after ingestion are often associated with  toxic reactions.  Performed at [facility], [place] [address], [place], [place] [address]    WBC 08/15/2020 9.9  4.0 - 10.5 K/uL Final   RBC 08/15/2020 3.98  3.87 - 5.11 MIL/uL Final   Hemoglobin 08/15/2020 9.2 (A)  12.0 - 15.0 g/dL Final   HCT 08/15/2020 31.4 (A)  36.0 - 46.0 % Final   MCV 08/15/2020 78.9 (A)  80.0 - 100.0 fL Final   MCH 08/15/2020 23.1 (A)  26.0 - 34.0 pg Final   MCHC 08/15/2020 29.3 (A)  30.0 - 36.0 g/dL Final   RDW 08/15/2020 16.3 (A)  11.5 - 15.5 %  Final   Platelets 08/15/2020 273  150 - 400 K/uL Final   nRBC 08/15/2020 0.0  0.0 - 0.2 % Final   Performed at [facility], [place] [address] [place], [place] [address]   Opiates 08/15/2020 NONE DETECTED  NONE DETECTED Final   Cocaine 08/15/2020 POSITIVE (A)  NONE DETECTED Final   Benzodiazepines 08/15/2020 NONE DETECTED  NONE DETECTED Final   Amphetamines 08/15/2020 NONE DETECTED  NONE DETECTED Final   Tetrahydrocannabinol 08/15/2020 NONE DETECTED  NONE DETECTED Final   Barbiturates 08/15/2020 NONE DETECTED  NONE DETECTED Final   Comment: (NOTE) DRUG SCREEN FOR MEDICAL PURPOSES ONLY.  IF CONFIRMATION IS NEEDED FOR ANY PURPOSE, NOTIFY LAB WITHIN 5 DAYS.  LOWEST DETECTABLE LIMITS FOR URINE DRUG SCREEN Drug Class                     Cutoff (ng/mL) Amphetamine and metabolites    1000 Barbiturate and metabolites    200 Benzodiazepine                 469 Tricyclics and metabolites     300 Opiates and metabolites        300 Cocaine and metabolites        300 THC                            50 Performed at [facility], [place] [address], [place], [place] [address]    I-stat hCG, quantitative 08/15/2020 <5.0  <5 mIU/mL Final   Comment 3 08/15/2020          Final   Comment:   GEST. AGE      CONC.  (mIU/mL)   <=1 WEEK        5 - 50     2 WEEKS       50 - 500  3 WEEKS       100 - 10,000     4 WEEKS     1,000 - 30,000        FEMALE AND NON-PREGNANT FEMALE:     LESS THAN 5 mIU/mL    SARS Coronavirus 2 by RT PCR 08/16/2020 NEGATIVE  NEGATIVE Final   Comment: (NOTE) SARS-CoV-2 target nucleic acids are NOT DETECTED.  The SARS-CoV-2 RNA is generally detectable in upper respiratory specimens during the acute phase of infection. The lowest concentration of SARS-CoV-2 viral copies this assay can detect is 138 copies/mL. A negative result does not preclude SARS-Cov-2 infection and should not be used as the sole basis for treatment or other patient management decisions. A negative  result may occur with  improper specimen collection/handling, submission of specimen other than nasopharyngeal swab, presence of viral mutation(s) within the areas targeted by this assay, and inadequate number of viral copies(<138 copies/mL). A negative result must be combined with clinical observations, patient history, and epidemiological information. The expected result is Negative.  Fact Sheet for Patients:  EntrepreneurPulse.com.au  Fact Sheet for Healthcare Providers:  IncredibleEmployment.be  This test is no                          t yet approved or cleared by the [place] FDA and  has been authorized for detection and/or diagnosis of SARS-CoV-2 by FDA under an Emergency Use Authorization (EUA). This EUA will remain  in effect (meaning this test can be used) for the duration of the COVID-19 declaration under Section 564(b)(1) of the Act, 21 U.S.C.section 360bbb-3(b)(1), unless the authorization is terminated  or revoked sooner.       Influenza A by PCR 08/16/2020 NEGATIVE  NEGATIVE Final   Influenza B by PCR 08/16/2020 NEGATIVE  NEGATIVE Final   Comment: (NOTE) The Xpert Xpress SARS-CoV-2/FLU/RSV plus assay is intended as an aid in the diagnosis of influenza from Nasopharyngeal swab specimens and should not be used as a sole basis for treatment. Nasal washings and aspirates are unacceptable for Xpert Xpress SARS-CoV-2/FLU/RSV testing.  Fact Sheet for Patients: EntrepreneurPulse.com.au  Fact Sheet for Healthcare Providers: IncredibleEmployment.be  This test is not yet approved or cleared by the [place] FDA and has been authorized for detection and/or diagnosis of SARS-CoV-2 by FDA under an Emergency Use Authorization (EUA). This EUA will remain in effect (meaning this test can be used) for the duration of the COVID-19 declaration under Section 564(b)(1) of the Act, 21 U.S.C. section  360bbb-3(b)(1), unless the authorization is terminated or revoked.  Performed at [facility], [place] [address] [place], [place] [address]    Cholesterol 08/15/2020 196  0 - 200 mg/dL Final   Triglycerides 08/15/2020 137  <150 mg/dL Final   HDL 08/15/2020 58  >40 mg/dL Final   Total CHOL/HDL Ratio 08/15/2020 3.4  RATIO Final   VLDL 08/15/2020 27  0 - 40 mg/dL Final   LDL Cholesterol 08/15/2020 111 (A)  0 - 99 mg/dL Final   Comment:        Total Cholesterol/HDL:CHD Risk Coronary Heart Disease Risk Table                     Men   Women  1/2 Average Risk   3.4   3.3  Average Risk       5.0   4.4  2 X Average Risk   9.6   7.1  3  X Average Risk  23.4   11.0        Use the calculated Patient Ratio above and the CHD Risk Table to determine the patient's CHD Risk.        ATP III CLASSIFICATION (LDL):  <100     mg/dL   Optimal  100-129  mg/dL   Near or Above                    Optimal  130-159  mg/dL   Borderline  160-189  mg/dL   High  >190     mg/dL   Very High Performed at [place] [address], [place], [place] [address]    Hgb A1c MFr Bld 08/15/2020 6.2 (A)  4.8 - 5.6 % Final   Comment: (NOTE) Pre diabetes:          5.7%-6.4%  Diabetes:              >6.4%  Glycemic control for   <7.0% adults with diabetes    Mean Plasma Glucose 08/15/2020 131.24  mg/dL Final   Performed at [facility], [place] [address], [place], [place] [address]    Blood Alcohol level:  Lab Results  Component Value Date   [name] <10 02/03/2021   ETH <10 62/69/4854    Metabolic Disorder Labs: Lab Results  Component Value Date   HGBA1C 6.2 (H) 08/15/2020   MPG 131.24 08/15/2020   MPG 114.02 02/10/2019   No results found for: PROLACTIN Lab Results  Component Value Date   CHOL 196 08/15/2020   TRIG 137 08/15/2020   HDL 58 08/15/2020   CHOLHDL 3.4 08/15/2020   VLDL 27 08/15/2020   LDLCALC 111 (H) 08/15/2020   LDLCALC 92 02/10/2019    Therapeutic Lab Levels: No  results found for: LITHIUM No results found for: VALPROATE No components found for:  CBMZ  Physical Findings   GAD-7    Flowsheet Row Office Visit from 11/10/2020 in [facility] [facility] 1  Total GAD-7 Score 14      PHQ2-9    [place] Visit from 11/10/2020 in [place] 1  PHQ-2 Total Score 4  PHQ-9 Total Score 9      Flowsheet Row ED from 02/05/2021 in [facility] ED from 02/03/2021 in [facility] ED from 12/23/2020 in [facility] DEPT  C-SSRS RISK CATEGORY High Risk High Risk No Risk        Musculoskeletal  Strength & Muscle Tone: did not assess; laying in bed Gait & Station: did not assess; laying in bed Patient leans: did not assess; laying in bed  Psychiatric Specialty Exam  Presentation  General Appearance: [place]; Other (comment) (patient laying in bed facing the wall throughout entire interview)  Speech:Clear and Coherent; Normal Rate  Speech Volume:Normal  Handedness:Right   Mood and Affect  Mood:-- ("the same")  Affect:Other (comment) (UTA due to patient facing wall entirety of interview)   Thought Process  Thought Processes:Coherent; Goal Directed; Linear  Descriptions of Associations:Intact  Orientation:Full (Time, Place and Person)  Thought Content:Logical; WDL  Diagnosis of Schizophrenia or Schizoaffective disorder in past: Yes  Duration of Psychotic Symptoms: Greater than six months (Patient reports experiencing auditory and visual hallucinations intermittently "over the years")   Hallucinations:Hallucinations: None  Ideas of Reference:None  Suicidal Thoughts:Suicidal Thoughts: -- ("not sure")  Homicidal Thoughts:Homicidal Thoughts: No   Sensorium  Memory:Immediate Fair;  Recent Fair; Remote Fair  Judgment:Intact  Insight:Present   Executive Functions  Concentration:Fair  Attention  Span:Fair  [place]   Psychomotor Activity  Psychomotor Activity:Psychomotor Activity: Normal   Assets  Assets:Communication Skills; Desire for Improvement; Physical Health; Resilience   Sleep  Sleep:Sleep: Fair Number of Hours of Sleep: 6   No data recorded  Physical Exam  Physical Exam Constitutional:      Appearance: She is normal weight.  HENT:     Head: Normocephalic and atraumatic.  Pulmonary:     Effort: Pulmonary effort is normal.  Neurological:     Mental Status: She is alert and oriented to person, place, and time.   Review of Systems  Constitutional:  Negative for chills and fever.  HENT:  Negative for hearing loss.   Eyes:  Negative for discharge and redness.  Respiratory:  Negative for cough.   Cardiovascular:  Negative for chest pain.  Gastrointestinal:  Negative for abdominal pain.  Musculoskeletal:  Positive for joint pain. Negative for myalgias.       Hip pain which she attributes to past surgery for artificial hip   Neurological:  Negative for headaches.  Psychiatric/Behavioral:  Positive for depression and substance abuse. Negative for hallucinations.        Patient unable to provide clear response to suicidal ideations  Blood pressure 116/64, pulse 73, temperature 98 F (36.7 C), temperature source Tympanic, resp. rate 18, last menstrual period 01/14/2021, SpO2 99 %. There is no height or weight on file to calculate BMI.  Treatment Plan Summary: 55 year old female with past history of reported schizophrenia and substance abuse who presented to [facility] for substance use treatment and suicidal thoughts if she is unable to detox.  Patient was accepted to day [facility] for Monday 11/14.  Patient was transferred to the [facility] on 11/11 for further detox and treatment.  Substance of choice is marijuana and cocaine.  UDS positive for benzos and cocaine.  Alcohol 302.  Patient provides history that could be  consistent with a primary psychotic disorder-experiencing hallucinations in the context of sobriety..  Unable to provide a clear answer when asked about suicidal ideations.  She denies current AVH/HI.  She admits to substance use and is interested interest in substance use treatment.  Patient has been accepted to [facility] on Monday.  Prior to discharge today [facility] on Monday patient will need to have her medications ordered on Sunday.  Schizophrenia R/o SIMD/SIPD -increase 50 mg seroquel qhs to 100 mg qhs   Cocaine use disorder -seroquel has some evidence that it may be helpful for cocaine cravings, see plan under schizophrenia  Dispo: Discharge to [facility] on Monday.  Patient will need medications ordered prior to discharge.  Ival Bible, MD 02/06/2021 4:11 PM
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
## 2021-02-06 NOTE — Progress Notes (Signed)
Patient isolating to room.  Sleeping.  Denied SI, HI, AVH.  Stated she wasn't really sure how she felt.  Did not want to converse.  Continue to monitor for safety.
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
## 2021-02-06 NOTE — ED Notes (Signed)
Pt is in the bed sleeping. Respirations are even and unlabored. No acute distress noted will continue to monitor for safety.

## 2021-02-06 NOTE — ED Notes (Signed)
Pt eating lunch

## 2021-02-07 DIAGNOSIS — F1490 Cocaine use, unspecified, uncomplicated: Secondary | ICD-10-CM | POA: Diagnosis not present

## 2021-02-07 DIAGNOSIS — F1994 Other psychoactive substance use, unspecified with psychoactive substance-induced mood disorder: Secondary | ICD-10-CM | POA: Diagnosis not present

## 2021-02-07 DIAGNOSIS — F1290 Cannabis use, unspecified, uncomplicated: Secondary | ICD-10-CM | POA: Diagnosis not present

## 2021-02-07 DIAGNOSIS — F1090 Alcohol use, unspecified, uncomplicated: Secondary | ICD-10-CM | POA: Diagnosis not present

## 2021-02-07 NOTE — ED Provider Notes (Addendum)
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 to the "Links" section for chest pain algorithms and additional  guidance. Performed at [facility], [place] [address], [place], [place] [address]   Admission on 08/16/2020, Discharged on 08/16/2020  Component Date Value Ref Range Status   TSH 08/16/2020 0.969  0.350 - 4.500 uIU/mL Final   Comment: Performed by a 3rd Generation assay with a functional sensitivity of <=0.01 uIU/mL. Performed at [facility], [place] [address] [place], [place] [address]   Admission on 08/15/2020, Discharged on 08/16/2020   Component Date Value Ref Range Status   Sodium 08/15/2020 135  135 - 145 mmol/L Final   Potassium 08/15/2020 3.7  3.5 - 5.1 mmol/L Final   Chloride 08/15/2020 103  98 - 111 mmol/L Final   CO2 08/15/2020 20 (A)  22 - 32 mmol/L Final   Glucose, Bld 08/15/2020 93  70 - 99 mg/dL Final   Glucose reference range applies only to samples taken after fasting for at least 8 hours.   BUN 08/15/2020 8  6 - 20 mg/dL Final   Creatinine, Ser 08/15/2020 0.88  0.44 - 1.00 mg/dL Final   Calcium 08/15/2020 9.7  8.9 - 10.3 mg/dL Final   Total Protein 08/15/2020 8.5 (A)  6.5 - 8.1 g/dL Final   Albumin 08/15/2020 4.4  3.5 - 5.0 g/dL Final   AST 08/15/2020 26  15 - 41 U/L Final   ALT 08/15/2020 19  0 - 44 U/L Final   Alkaline Phosphatase 08/15/2020 86  38 - 126 U/L Final   Total Bilirubin 08/15/2020 0.1 (A)  0.3 - 1.2 mg/dL Final   GFR, Estimated 08/15/2020 >60  >60 mL/min Final   Comment: (NOTE) Calculated using the CKD-EPI Creatinine Equation (2021)    Anion gap 08/15/2020 12  5 - 15 Final   Performed at [facility] [address] [place], [place] [address]   Alcohol, Ethyl (B) 08/15/2020 <10  <10 mg/dL Final   Comment: (NOTE) Lowest detectable limit for serum alcohol is 10 mg/dL.  For medical purposes only. Performed at [facility], [place] [address], [place], [place] [address]    Salicylate Lvl 06/77/0340 <7.0 (A)  7.0 - 30.0 mg/dL Final   Performed at [facility] [address], [place], [place] [address]   Acetaminophen (Tylenol), Serum 08/15/2020 <10 (A)  10 - 30 ug/mL Final   Comment: (NOTE) Therapeutic concentrations vary significantly. A range of 10-30 ug/mL  may be an effective concentration for many patients. However, some  are best treated at concentrations outside of this range. Acetaminophen concentrations >150 ug/mL at 4 hours after ingestion  and >50 ug/mL at 12 hours after ingestion are often associated with  toxic reactions.  Performed at [facility], [place] [address], [place], [place] [address]    WBC 08/15/2020 9.9  4.0 - 10.5 K/uL Final   RBC 08/15/2020 3.98  3.87 - 5.11 MIL/uL Final   Hemoglobin 08/15/2020 9.2 (A)  12.0 - 15.0 g/dL Final   HCT 08/15/2020 31.4 (A)  36.0 - 46.0 % Final   MCV 08/15/2020 78.9 (A)  80.0 - 100.0 fL Final   MCH 08/15/2020 23.1 (A)  26.0 - 34.0 pg Final   MCHC 08/15/2020 29.3 (A)  30.0 - 36.0 g/dL Final   RDW 08/15/2020 16.3 (A)  11.5 - 15.5 % Final  Platelets 08/15/2020 273  150 - 400 K/uL Final   nRBC 08/15/2020 0.0  0.0 - 0.2 % Final   Performed at [facility], [place] [address], [place], [place] [address]   Opiates 08/15/2020 NONE DETECTED  NONE DETECTED Final   Cocaine 08/15/2020 POSITIVE (A)  NONE DETECTED Final   Benzodiazepines 08/15/2020 NONE DETECTED  NONE DETECTED Final   Amphetamines 08/15/2020 NONE DETECTED  NONE DETECTED Final   Tetrahydrocannabinol 08/15/2020 NONE DETECTED  NONE DETECTED Final   Barbiturates 08/15/2020 NONE DETECTED  NONE DETECTED Final   Comment: (NOTE) DRUG SCREEN FOR MEDICAL PURPOSES ONLY.  IF CONFIRMATION IS NEEDED FOR ANY PURPOSE, NOTIFY LAB WITHIN 5 DAYS.  LOWEST DETECTABLE LIMITS FOR URINE DRUG SCREEN Drug Class                     Cutoff (ng/mL) Amphetamine and metabolites    1000 Barbiturate and metabolites    200 Benzodiazepine                 017 Tricyclics and metabolites     300 Opiates and metabolites        300 Cocaine and metabolites        300 THC                            50 Performed at [facility], [place] [address] [place], [place] [address]    I-stat hCG, quantitative 08/15/2020 <5.0  <5 mIU/mL Final   Comment 3 08/15/2020          Final   Comment:   GEST. AGE      CONC.  (mIU/mL)   <=1 WEEK        5 - 50     2 WEEKS       50 - 500     3 WEEKS       100 - 10,000     4 WEEKS     1,000 - 30,000        FEMALE AND NON-PREGNANT FEMALE:     LESS THAN 5 mIU/mL    SARS Coronavirus 2 by RT PCR 08/16/2020 NEGATIVE  NEGATIVE  Final   Comment: (NOTE) SARS-CoV-2 target nucleic acids are NOT DETECTED.  The SARS-CoV-2 RNA is generally detectable in upper respiratory specimens during the acute phase of infection. The lowest concentration of SARS-CoV-2 viral copies this assay can detect is 138 copies/mL. A negative result does not preclude SARS-Cov-2 infection and should not be used as the sole basis for treatment or other patient management decisions. A negative result may occur with  improper specimen collection/handling, submission of specimen other than nasopharyngeal swab, presence of viral mutation(s) within the areas targeted by this assay, and inadequate number of viral copies(<138 copies/mL). A negative result must be combined with clinical observations, patient history, and epidemiological information. The expected result is Negative.  Fact Sheet for Patients:  EntrepreneurPulse.com.au  Fact Sheet for Healthcare Providers:  IncredibleEmployment.be  This test is no                          t yet approved or cleared by the [place] FDA and  has been authorized for detection and/or diagnosis of SARS-CoV-2 by FDA under an Emergency Use Authorization (EUA). This EUA will remain  in effect (meaning this test can be used) for the duration of the COVID-19  declaration under Section 564(b)(1) of the Act, 21 U.S.C.section 360bbb-3(b)(1), unless the authorization is terminated  or revoked sooner.       Influenza A by PCR 08/16/2020 NEGATIVE  NEGATIVE Final   Influenza B by PCR 08/16/2020 NEGATIVE  NEGATIVE Final   Comment: (NOTE) The Xpert Xpress SARS-CoV-2/FLU/RSV plus assay is intended as an aid in the diagnosis of influenza from Nasopharyngeal swab specimens and should not be used as a sole basis for treatment. Nasal washings and aspirates are unacceptable for Xpert Xpress SARS-CoV-2/FLU/RSV testing.  Fact Sheet for  Patients: EntrepreneurPulse.com.au  Fact Sheet for Healthcare Providers: IncredibleEmployment.be  This test is not yet approved or cleared by the [place] FDA and has been authorized for detection and/or diagnosis of SARS-CoV-2 by FDA under an Emergency Use Authorization (EUA). This EUA will remain in effect (meaning this test can be used) for the duration of the COVID-19 declaration under Section 564(b)(1) of the Act, 21 U.S.C. section 360bbb-3(b)(1), unless the authorization is terminated or revoked.  Performed at [facility], [place] [address], [place], [place] [address]    Cholesterol 08/15/2020 196  0 - 200 mg/dL Final   Triglycerides 08/15/2020 137  <150 mg/dL Final   HDL 08/15/2020 58  >40 mg/dL Final   Total CHOL/HDL Ratio 08/15/2020 3.4  RATIO Final   VLDL 08/15/2020 27  0 - 40 mg/dL Final   LDL Cholesterol 08/15/2020 111 (A)  0 - 99 mg/dL Final   Comment:        Total Cholesterol/HDL:CHD Risk Coronary Heart Disease Risk Table                     Men   Women  1/2 Average Risk   3.4   3.3  Average Risk       5.0   4.4  2 X Average Risk   9.6   7.1  3 X Average Risk  23.4   11.0        Use the calculated Patient Ratio above and the CHD Risk Table to determine the patient's CHD Risk.        ATP III CLASSIFICATION (LDL):  <100     mg/dL   Optimal  100-129  mg/dL   Near or Above                    Optimal  130-159  mg/dL   Borderline  160-189  mg/dL   High  >190     mg/dL   Very High Performed at [facility] [address], [place], [place] [address]    Hgb A1c MFr Bld 08/15/2020 6.2 (A)  4.8 - 5.6 % Final   Comment: (NOTE) Pre diabetes:          5.7%-6.4%  Diabetes:              >6.4%  Glycemic control for   <7.0% adults with diabetes    Mean Plasma Glucose 08/15/2020 131.24  mg/dL Final   Performed at [facility], [place] [address] [place], [place] [address]    Blood Alcohol level:  Lab Results   Component Value Date   [facility] <10 02/03/2021   ETH <10 19/37/9024    Metabolic Disorder Labs: Lab Results  Component Value Date   HGBA1C 6.2 (H) 08/15/2020   MPG 131.24 08/15/2020   MPG 114.02 02/10/2019   No results found for: PROLACTIN Lab Results  Component Value Date  CHOL 196 08/15/2020   TRIG 137 08/15/2020   HDL 58 08/15/2020   CHOLHDL 3.4 08/15/2020   VLDL 27 08/15/2020   LDLCALC 111 (H) 08/15/2020   LDLCALC 92 02/10/2019    Therapeutic Lab Levels: No results found for: LITHIUM No results found for: VALPROATE No components found for:  CBMZ  Physical Findings   GAD-7    Flowsheet Row Office Visit from 11/10/2020 in [facility] 1  Total GAD-7 Score 14      PHQ2-9    [facility] Visit from 11/10/2020 in [place] 1  PHQ-2 Total Score 4  PHQ-9 Total Score 9      Flowsheet Row ED from 02/05/2021 in [facility] ED from 02/03/2021 in [facility] ED from 12/23/2020 in [facility] DEPT  C-SSRS RISK CATEGORY High Risk High Risk No Risk        Musculoskeletal  Strength & Muscle Tone: within normal limits Gait & Station: normal Patient leans: N/A  Psychiatric Specialty Exam  Presentation  General Appearance: Disheveled  Eye Contact:Minimal  Speech:Clear and Coherent  Speech Volume:Normal  Handedness:Right   Mood and Affect  Mood:Dysphoric  Affect:Congruent   Thought Process  Thought Processes:Coherent; Goal Directed  Descriptions of Associations:Intact  Orientation:Full (Time, Place and Person)  Thought Content:Logical  Diagnosis of Schizophrenia or Schizoaffective disorder in past: Yes  Duration of Psychotic Symptoms: Greater than six months (Patient reports experiencing auditory and visual hallucinations intermittently "over the years")   Hallucinations:Hallucinations: None  Ideas of Reference:None  Suicidal  Thoughts:Suicidal Thoughts: No  Homicidal Thoughts:Homicidal Thoughts: No   Sensorium  Memory:Immediate Fair; Recent Fair; Remote Fair  Judgment:Intact  Insight:Present   Executive Functions  Concentration:Fair  Attention Span:Fair  [place]   Psychomotor Activity  Psychomotor Activity:Psychomotor Activity: Normal   Assets  Assets:Communication Skills; Desire for Improvement; Physical Health   Sleep  Sleep:Sleep: Poor   Physical Exam  Physical Exam HENT:     Head: Normocephalic.     Nose: Nose normal.  Eyes:     Conjunctiva/sclera: Conjunctivae normal.  Cardiovascular:     Rate and Rhythm: Normal rate.  Pulmonary:     Effort: Pulmonary effort is normal.  Musculoskeletal:        General: Normal range of motion.     Cervical back: Normal range of motion.  Neurological:     Mental Status: She is alert and oriented to person, place, and time.   Review of Systems  Constitutional: Negative.   HENT: Negative.    Eyes: Negative.   Respiratory: Negative.    Cardiovascular: Negative.   Gastrointestinal: Negative.   Genitourinary: Negative.   Musculoskeletal:  Positive for back pain.  Skin: Negative.   Neurological: Negative.   Endo/Heme/Allergies: Negative.   Psychiatric/Behavioral:  Positive for depression and substance abuse.   Blood pressure 109/64, pulse 72, temperature 98 F (36.7 C), temperature source Tympanic, resp. rate 18, last menstrual period 01/14/2021, SpO2 97 %. There is no height or weight on file to calculate BMI.  Treatment Plan Summary: Schizophrenia R/o SIMD/SIPD -increase 50 mg seroquel qhs to 100 mg qhs    Cocaine use disorder -seroquel has some evidence that it may be helpful for cocaine cravings, see plan under schizophrenia  Back Pain- Tylenol 650 mg po q 6 hours as needed for pain.    Dispo: Discharge to [facility] on Monday. Patient will need medications ordered prior to discharge.  Pharmacist informed today that the patient will need sample medication on Monday for [facility]. Will follow up with pharmacy on 02/08/21 and place sample medications orders for Monday morning.   Marissa Calamity, NP 02/07/2021 9:36 AM

## 2021-02-07 NOTE — Progress Notes (Signed)
Patient out to nurse's station. Denied SI, HI, AVH.  Patient not wanting to converse at this time.  Stated she's not sure she'll attend groups and denied having a goal for today. Continue to monitor.

## 2021-02-07 NOTE — ED Notes (Signed)
Patient resting quietly with eyes closed, no S/S of distress, respirations even and unlabored. Earlier complained of being too hot. Currently no complaints. Respirations even and unlabored. Patient refused her PRN Tramadol at 10 pm. Will continue to monitor for safety.

## 2021-02-07 NOTE — ED Notes (Signed)
Patient is resting quietly in bed with eyes closed. No S/S of distress. Respirations even and unlabored, Will continue to monitor for safety.

## 2021-02-07 NOTE — Progress Notes (Signed)
Patient resting on bed with eyes closed.  Respirations even and unlabored.  Continue to monitor for safety.

## 2021-02-07 NOTE — ED Notes (Signed)
Pt laying in bed. A&O x4, calm and cooperative. Pt denies current SI/HI/AVH. Pt denies any current needs. No signs of acute distress noted. Will continue to monitor for safety.

## 2021-02-07 NOTE — ED Notes (Signed)
Pt asleep in bed. Respirations even and unlabored. Will continue to monitor for safety. ?

## 2021-02-07 NOTE — ED Notes (Signed)
Snacks given 

## 2021-02-08 DIAGNOSIS — F1994 Other psychoactive substance use, unspecified with psychoactive substance-induced mood disorder: Secondary | ICD-10-CM | POA: Diagnosis not present

## 2021-02-08 DIAGNOSIS — F1090 Alcohol use, unspecified, uncomplicated: Secondary | ICD-10-CM | POA: Diagnosis not present

## 2021-02-08 DIAGNOSIS — F1290 Cannabis use, unspecified, uncomplicated: Secondary | ICD-10-CM | POA: Diagnosis not present

## 2021-02-08 DIAGNOSIS — F1490 Cocaine use, unspecified, uncomplicated: Secondary | ICD-10-CM | POA: Diagnosis not present

## 2021-02-08 MED ORDER — TRAZODONE HCL 50 MG PO TABS: 50.0000 mg | ORAL_TABLET | Freq: Every evening | ORAL | 0 refills | Status: DC | PRN

## 2021-02-08 MED ORDER — QUETIAPINE FUMARATE 100 MG PO TABS: 100.0000 mg | ORAL_TABLET | Freq: Every day | ORAL | 0 refills | Status: DC

## 2021-02-08 NOTE — Progress Notes (Signed)
Patient slept most of the day.  She is labile with periods of tearfulness.  She accepts verbal support and reassurance.  She is focused on discharge to [facility] tentatively scheduled for the morning.  She ate dinner and is now watching tv with peer.  No complaints or distress and she is future oriented denying avh shi or plan.  Will monitor and provide safe supportive environment.

## 2021-02-08 NOTE — ED Provider Notes (Signed)
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 08/15/2020 135  135 - 145 mmol/L Final   Potassium 08/15/2020 3.7  3.5 - 5.1 mmol/L Final   Chloride 08/15/2020 103  98 - 111 mmol/L Final   CO2 08/15/2020 20 (A)  22 - 32 mmol/L Final   Glucose, Bld 08/15/2020 93  70 - 99 mg/dL Final   Glucose reference range applies only to samples taken after fasting for at least 8 hours.   BUN 08/15/2020 8  6 - 20 mg/dL Final   Creatinine, Ser 08/15/2020 0.88  0.44 - 1.00 mg/dL Final   Calcium 08/15/2020 9.7  8.9 - 10.3 mg/dL Final   Total Protein 08/15/2020 8.5 (A)  6.5 - 8.1 g/dL  Final   Albumin 08/15/2020 4.4  3.5 - 5.0 g/dL Final   AST 08/15/2020 26  15 - 41 U/L Final   ALT 08/15/2020 19  0 - 44 U/L Final   Alkaline Phosphatase 08/15/2020 86  38 - 126 U/L Final   Total Bilirubin 08/15/2020 0.1 (A)  0.3 - 1.2 mg/dL Final   GFR, Estimated 08/15/2020 >60  >60 mL/min Final   Comment: (NOTE) Calculated using the CKD-EPI Creatinine Equation (2021)    Anion gap 08/15/2020 12  5 - 15 Final   Performed at [facility] [address], [place], [place] [address]   Alcohol, Ethyl (B) 08/15/2020 <10  <10 mg/dL Final   Comment: (NOTE) Lowest detectable limit for serum alcohol is 10 mg/dL.  For medical purposes only. Performed at [facility], [place] [address] [place], [place] [address]    Salicylate Lvl 23/53/6144 <7.0 (A)  7.0 - 30.0 mg/dL Final   Performed at [facility] [address] [place], [place] [address]   Acetaminophen (Tylenol), Serum 08/15/2020 <10 (A)  10 - 30 ug/mL Final   Comment: (NOTE) Therapeutic concentrations vary significantly. A range of 10-30 ug/mL  may be an effective concentration for many patients. However, some  are best treated at concentrations outside of this range. Acetaminophen concentrations >150 ug/mL at 4 hours after ingestion  and >50 ug/mL at 12 hours after ingestion are often associated with  toxic reactions.  Performed at [facility], [place] [address] [place], [place] [address]    WBC 08/15/2020 9.9  4.0 - 10.5 K/uL Final   RBC 08/15/2020 3.98  3.87 - 5.11 MIL/uL Final   Hemoglobin 08/15/2020 9.2 (A)  12.0 - 15.0 g/dL Final   HCT 08/15/2020 31.4 (A)  36.0 - 46.0 % Final   MCV 08/15/2020 78.9 (A)  80.0 - 100.0 fL Final   MCH 08/15/2020 23.1 (A)  26.0 - 34.0 pg Final   MCHC 08/15/2020 29.3 (A)  30.0 - 36.0 g/dL Final   RDW 08/15/2020 16.3 (A)  11.5 - 15.5 % Final   Platelets 08/15/2020 273  150 - 400 K/uL Final   nRBC 08/15/2020 0.0  0.0 - 0.2 % Final   Performed at [facility], [place] [address], [place], [place] [address]   Opiates 08/15/2020 NONE DETECTED  NONE DETECTED Final   Cocaine 08/15/2020 POSITIVE (A)  NONE DETECTED Final   Benzodiazepines 08/15/2020 NONE DETECTED  NONE DETECTED Final   Amphetamines 08/15/2020 NONE DETECTED  NONE DETECTED Final   Tetrahydrocannabinol 08/15/2020 NONE DETECTED  NONE DETECTED Final   Barbiturates 08/15/2020 NONE DETECTED  NONE DETECTED Final   Comment: (NOTE) DRUG SCREEN FOR MEDICAL PURPOSES  ONLY.  IF CONFIRMATION IS NEEDED FOR ANY PURPOSE, NOTIFY LAB WITHIN 5 DAYS.  LOWEST DETECTABLE LIMITS FOR URINE DRUG SCREEN Drug Class                     Cutoff (ng/mL) Amphetamine and metabolites    1000 Barbiturate and metabolites    200 Benzodiazepine                 850 Tricyclics and metabolites     300 Opiates and metabolites        300 Cocaine and metabolites        300 THC                            50 Performed at [facility], [place] [address], [place], [place] [address]    I-stat hCG, quantitative 08/15/2020 <5.0  <5 mIU/mL Final   Comment 3 08/15/2020          Final   Comment:   GEST. AGE      CONC.  (mIU/mL)   <=1 WEEK        5 - 50     2 WEEKS       50 - 500     3 WEEKS       100 - 10,000     4 WEEKS     1,000 - 30,000        FEMALE AND NON-PREGNANT FEMALE:     LESS THAN 5 mIU/mL    SARS Coronavirus 2 by RT PCR 08/16/2020 NEGATIVE  NEGATIVE Final   Comment: (NOTE) SARS-CoV-2 target nucleic acids are NOT DETECTED.  The SARS-CoV-2 RNA is generally detectable in upper respiratory specimens during the acute phase of infection. The lowest concentration of SARS-CoV-2 viral copies this assay can detect is 138 copies/mL. A negative result does not preclude SARS-Cov-2 infection and should not be used as the sole basis for treatment or other patient management decisions. A negative result may occur with  improper specimen collection/handling, submission of specimen other than nasopharyngeal swab, presence of viral  mutation(s) within the areas targeted by this assay, and inadequate number of viral copies(<138 copies/mL). A negative result must be combined with clinical observations, patient history, and epidemiological information. The expected result is Negative.  Fact Sheet for Patients:  EntrepreneurPulse.com.au  Fact Sheet for Healthcare Providers:  IncredibleEmployment.be  This test is no                          t yet approved or cleared by the [place] FDA and  has been authorized for detection and/or diagnosis of SARS-CoV-2 by FDA under an Emergency Use Authorization (EUA). This EUA will remain  in effect (meaning this test can be used) for the duration of the COVID-19 declaration under Section 564(b)(1) of the Act, 21 U.S.C.section 360bbb-3(b)(1), unless the authorization is terminated  or revoked sooner.       Influenza A by PCR 08/16/2020 NEGATIVE  NEGATIVE Final   Influenza B by PCR 08/16/2020 NEGATIVE  NEGATIVE Final   Comment: (NOTE) The Xpert Xpress SARS-CoV-2/FLU/RSV plus assay is intended as an aid in the diagnosis of influenza from Nasopharyngeal swab specimens and should not be used as a sole basis for treatment. Nasal washings and aspirates are unacceptable for Xpert Xpress SARS-CoV-2/FLU/RSV testing.  Fact Sheet for Patients: EntrepreneurPulse.com.au  Fact Sheet for Healthcare Providers: IncredibleEmployment.be  This test is not yet approved or cleared by the [place] and has been authorized for detection and/or diagnosis of SARS-CoV-2 by FDA under an Emergency Use Authorization (EUA). This EUA will remain in effect (meaning this test can be used) for the duration of the COVID-19 declaration under Section 564(b)(1) of the Act, 21 U.S.C. section 360bbb-3(b)(1), unless the authorization is terminated or revoked.  Performed at [facility], [place] [address], [place],  [place] [address]    Cholesterol 08/15/2020 196  0 - 200 mg/dL Final   Triglycerides 08/15/2020 137  <150 mg/dL Final   HDL 08/15/2020 58  >40 mg/dL Final   Total CHOL/HDL Ratio 08/15/2020 3.4  RATIO Final   VLDL 08/15/2020 27  0 - 40 mg/dL Final   LDL Cholesterol 08/15/2020 111 (A)  0 - 99 mg/dL Final   Comment:        Total Cholesterol/HDL:CHD Risk Coronary Heart Disease Risk Table                     Men   Women  1/2 Average Risk   3.4   3.3  Average Risk       5.0   4.4  2 X Average Risk   9.6   7.1  3 X Average Risk  23.4   11.0        Use the calculated Patient Ratio above and the CHD Risk Table to determine the patient's CHD Risk.        ATP III CLASSIFICATION (LDL):  <100     mg/dL   Optimal  100-129  mg/dL   Near or Above                    Optimal  130-159  mg/dL   Borderline  160-189  mg/dL   High  >190     mg/dL   Very High Performed at [facility] [address], [place], [place] [address]    Hgb A1c MFr Bld 08/15/2020 6.2 (A)  4.8 - 5.6 % Final   Comment: (NOTE) Pre diabetes:          5.7%-6.4%  Diabetes:              >6.4%  Glycemic control for   <7.0% adults with diabetes    Mean Plasma Glucose 08/15/2020 131.24  mg/dL Final   Performed at [facility], [place] [address] [place], [place] [address]    Blood Alcohol level:  Lab Results  Component Value Date   [facility] <10 02/03/2021   ETH <10 37/16/9678    Metabolic Disorder Labs: Lab Results  Component Value Date   HGBA1C 6.2 (H) 08/15/2020   MPG 131.24 08/15/2020   MPG 114.02 02/10/2019   No results found for: PROLACTIN Lab Results  Component Value Date   CHOL 196 08/15/2020   TRIG 137 08/15/2020   HDL 58 08/15/2020   CHOLHDL 3.4 08/15/2020   VLDL 27 08/15/2020   LDLCALC 111 (H) 08/15/2020   LDLCALC 92 02/10/2019    Therapeutic Lab Levels: No results found for: LITHIUM No results found for: VALPROATE No components found for:  CBMZ  Physical Findings   GAD-7    Flowsheet Row  Office Visit from 11/10/2020 in [facility] 1  Total GAD-7 Score 14      PHQ2-9    [place] Office Visit from 11/10/2020 in [facility] 1  PHQ-2 Total Score  4  PHQ-9 Total Score 9      Flowsheet Row ED from 02/05/2021 in [facility] ED from 02/03/2021 in [facility] from 12/23/2020 in [facility] DEPT  C-SSRS RISK CATEGORY High Risk High Risk No Risk        Musculoskeletal  Strength & Muscle Tone: within normal limits Gait & Station: normal Patient leans: N/A  Psychiatric Specialty Exam  Presentation  General Appearance: Disheveled  Eye Contact:Fair  Speech:Clear and Coherent  Speech Volume:Normal  Handedness:Right   Mood and Affect  Mood:Dysphoric  Affect:Congruent   Thought Process  Thought Processes:Coherent; Goal Directed  Descriptions of Associations:Intact  Orientation:Full (Time, Place and Person)  Thought Content:Logical  Diagnosis of Schizophrenia or Schizoaffective disorder in past: Yes  Duration of Psychotic Symptoms: Greater than six months (Patient reports experiencing auditory and visual hallucinations intermittently "over the years")   Hallucinations:Hallucinations: Auditory  Ideas of Reference:None  Suicidal Thoughts:Suicidal Thoughts: No  Homicidal Thoughts:Homicidal Thoughts: No   Sensorium  Memory:Immediate Fair  Judgment:Intact  Insight:Present   Executive Functions  Concentration:Fair  Attention Span:Fair  [place]   Psychomotor Activity  Psychomotor Activity:Psychomotor Activity: Normal   Assets  Assets:Communication Skills; Desire for Improvement   Sleep  Sleep:Sleep: Poor   No data recorded  Physical Exam  Physical Exam Constitutional:      Appearance: Normal appearance.  HENT:     Head: Normocephalic.     Nose: Nose normal.  Eyes:      Conjunctiva/sclera: Conjunctivae normal.  Cardiovascular:     Rate and Rhythm: Normal rate.  Pulmonary:     Effort: Pulmonary effort is normal.  Musculoskeletal:        General: Normal range of motion.     Cervical back: Normal range of motion.  Neurological:     Mental Status: She is alert and oriented to person, place, and time.   Review of Systems  Constitutional: Negative.   HENT: Negative.    Eyes: Negative.   Respiratory: Negative.    Cardiovascular: Negative.   Gastrointestinal: Negative.   Genitourinary: Negative.   Musculoskeletal: Negative.   Skin: Negative.   Neurological: Negative.   Endo/Heme/Allergies: Negative.   Blood pressure (!) 145/82, pulse 82, temperature (!) 97.3 F (36.3 C), temperature source Temporal, resp. rate 16, last menstrual period 01/14/2021, SpO2 96 %. There is no height or weight on file to calculate BMI.  Treatment Plan Summary:  Schizophrenia R/o SIMD/SIPD Continue seroquel 100 mg po qhs    Cocaine use disorder -seroquel has some evidence that it may be helpful for cocaine cravings, see plan under schizophrenia   Back Pain- Tylenol 650 mg po q 6 hours as needed for pain.    Dispo: Discharge to [facility] on Monday. Patient will need medications ordered prior to discharge. Sample medications ordered for [facility] on Monday and prescriptions printed.    Marissa Calamity, NP 02/08/2021 11:56 AM

## 2021-02-08 NOTE — ED Notes (Signed)
Pt asleep in bed. Respirations even and unlabored. Will continue to monitor for safety. ?

## 2021-02-08 NOTE — ED Notes (Signed)
Pt sitting in dining room watching TV. A&O x4, calm and cooperative. Pt denies current SI/HI/AVH. Graham crackers, jello, and juice given per pt request. Pt denies any further needs at this time. No signs of acute distress noted. Will continue to monitor for safety.

## 2021-02-08 NOTE — Progress Notes (Signed)
Patient is presently asleep in bed without distress or complaint.  Will monitor and provide safe environment.

## 2021-02-09 DIAGNOSIS — F1994 Other psychoactive substance use, unspecified with psychoactive substance-induced mood disorder: Secondary | ICD-10-CM | POA: Diagnosis not present

## 2021-02-09 DIAGNOSIS — F1090 Alcohol use, unspecified, uncomplicated: Secondary | ICD-10-CM | POA: Diagnosis not present

## 2021-02-09 DIAGNOSIS — F1490 Cocaine use, unspecified, uncomplicated: Secondary | ICD-10-CM | POA: Diagnosis not present

## 2021-02-09 DIAGNOSIS — F1290 Cannabis use, unspecified, uncomplicated: Secondary | ICD-10-CM | POA: Diagnosis not present
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
## 2021-02-09 NOTE — Discharge Instructions (Signed)

## 2021-02-09 NOTE — ED Notes (Signed)
Pt asleep in bed. Respirations even and unlabored. Will continue to monitor for safety. ?

## 2021-02-09 NOTE — ED Notes (Signed)
Patient is awake resting in bed without distress or complaint.  Patient is calm and cooperative with mild mood lability.  Affect constricted, thought process organized.  She denies avh shi or plan at present.  She is focused on discharge and aftercare.  Future and goal oriented. Will monitor and provide support as needed.

## 2021-02-09 NOTE — Progress Notes (Signed)
Patient discharged from facility into care of safe transport who are bringing her to [facility] in [place].  Patient given all property, valuables, med samples and scripts.  She was alert, calm and cooperative and verbalized understanding of d/c plan.  She denied avh shi or plan.

## 2021-02-10 ENCOUNTER — Telehealth (HOSPITAL_COMMUNITY): Payer: Self-pay

## 2021-02-10 NOTE — BH Assessment (Addendum)
Care Management - Follow Up Discharges   Writer attempted to make contact with patient today and was unsuccessful.  Mailbox is full.    Per chart review, patient was provided with outpatient resources.

## 2021-02-25 ENCOUNTER — Other Ambulatory Visit (HOSPITAL_COMMUNITY)
Admission: RE | Admit: 2021-02-25 | Discharge: 2021-02-25 | Disposition: A | Discharge status: Home / Self Care | Payer: Medicaid Other | Source: Ambulatory Visit | Attending: Physician Assistant | Admitting: Physician Assistant

## 2021-02-25 ENCOUNTER — Encounter: Payer: Self-pay | Admitting: Physician Assistant

## 2021-02-25 ENCOUNTER — Other Ambulatory Visit: Payer: Self-pay

## 2021-02-25 ENCOUNTER — Ambulatory Visit: Payer: Medicaid Other | Attending: Physician Assistant | Admitting: Physician Assistant

## 2021-02-25 VITALS — BP 145/87 | HR 80 | Temp 98.8°F | Resp 18 | Ht 65.0 in | Wt 149.0 lb

## 2021-02-25 DIAGNOSIS — F5104 Psychophysiologic insomnia: Secondary | ICD-10-CM

## 2021-02-25 DIAGNOSIS — Z8619 Personal history of other infectious and parasitic diseases: Secondary | ICD-10-CM | POA: Insufficient documentation

## 2021-02-25 DIAGNOSIS — A599 Trichomoniasis, unspecified: Secondary | ICD-10-CM

## 2021-02-25 DIAGNOSIS — Z113 Encounter for screening for infections with a predominantly sexual mode of transmission: Secondary | ICD-10-CM | POA: Diagnosis present

## 2021-02-25 DIAGNOSIS — B3731 Acute candidiasis of vulva and vagina: Secondary | ICD-10-CM

## 2021-02-25 DIAGNOSIS — R110 Nausea: Secondary | ICD-10-CM

## 2021-02-25 DIAGNOSIS — U071 COVID-19: Secondary | ICD-10-CM

## 2021-02-25 DIAGNOSIS — F200 Paranoid schizophrenia: Secondary | ICD-10-CM | POA: Diagnosis not present

## 2021-02-25 DIAGNOSIS — F1910 Other psychoactive substance abuse, uncomplicated: Secondary | ICD-10-CM

## 2021-02-25 DIAGNOSIS — B9689 Other specified bacterial agents as the cause of diseases classified elsewhere: Secondary | ICD-10-CM

## 2021-02-25 DIAGNOSIS — F1490 Cocaine use, unspecified, uncomplicated: Secondary | ICD-10-CM

## 2021-02-25 DIAGNOSIS — N760 Acute vaginitis: Secondary | ICD-10-CM

## 2021-02-25 MED ORDER — QUETIAPINE FUMARATE 100 MG PO TABS: 100.0000 mg | ORAL_TABLET | Freq: Every day | ORAL | 1 refills | Status: DC

## 2021-02-25 MED ORDER — ONDANSETRON HCL 4 MG PO TABS: 4.0000 mg | ORAL_TABLET | Freq: Three times a day (TID) | ORAL | 0 refills | Status: DC | PRN

## 2021-02-25 MED ORDER — TRAZODONE HCL 50 MG PO TABS: ORAL_TABLET | ORAL | 1 refills | Status: DC

## 2021-02-25 NOTE — Patient Instructions (Signed)
To help with your nausea, you are going to take Zofran as needed.  I do encourage you to increase your caloric intake as well as your hydration.  To help with your insomnia, you can take up to 100 mg of trazodone as needed.  We will call you with today's lab results.  You will follow-up with the mobile unit in 2 weeks.  Kennieth Rad, PA-C Physician Assistant [facility] [email or web address]   Nausea, Adult Nausea is the feeling of having an upset stomach or that you are about to vomit. Nausea on its own is not usually a serious concern, but it may be an early sign of a more serious medical problem. As nausea gets worse, it can lead to vomiting. If vomiting develops, or if you are not able to drink enough fluids, you are at risk of becoming dehydrated. Dehydration can make you tired and thirsty, cause you to have a dry mouth, and decrease how often you urinate. Older adults and people with other diseases or a weak disease-fighting system (immune system) are at higher risk for dehydration. The main goals of treating your nausea are: To relieve your nausea. To limit repeated nausea episodes. To prevent vomiting and dehydration. Follow these instructions at home: Watch your symptoms for any changes. Tell your health care provider about them. Eating and drinking   Take an oral rehydration solution (ORS). This is a drink that is sold at pharmacies and retail stores. Drink clear fluids slowly and in small amounts as you are able. Clear fluids include water, ice chips, low-calorie sports drinks, and fruit juice that has water added (diluted fruit juice). Eat bland, easy-to-digest foods in small amounts as you are able. These foods include bananas, applesauce, rice, lean meats, toast, and crackers. Avoid drinking fluids that contain a lot of sugar or caffeine, such as energy drinks, sports drinks, and soda. Avoid alcohol. Avoid spicy or  fatty foods. General instructions Take over-the-counter and prescription medicines only as told by your health care provider. Rest at home while you recover. Drink enough fluid to keep your urine pale yellow. Breathe slowly and deeply when you feel nauseous. Avoid smelling things that have strong odors. Wash your hands often using soap and water for at least 20 seconds. If soap and water are not available, use hand sanitizer. Make sure that everyone in your household washes their hands well and often. Keep all follow-up visits. This is important. Contact a health care provider if: Your nausea gets worse. Your nausea does not go away after two days. You vomit multiple times. You cannot drink fluids without vomiting. You have any of the following: New symptoms. A fever. A headache. Muscle cramps. A rash. Pain while urinating. You feel light-headed or dizzy. Get help right away if: You have pain in your chest, neck, arm, or jaw. You feel extremely weak or you faint. You have vomit that is bright red or looks like coffee grounds. You have bloody or black stools (feces) or stools that look like tar. You have a severe headache, a stiff neck, or both. You have severe pain, cramping, or bloating in your abdomen. You have difficulty breathing or are breathing very quickly. Your heart is beating very quickly. Your skin feels cold and clammy. You feel confused. You have signs of dehydration, such as: Dark urine, very little urine, or no urine. Cracked lips. Dry mouth. Sunken eyes. Sleepiness. Weakness. These symptoms may be an emergency. Get help right away. Call 911. Do  not wait to see if the symptoms will go away. Do not drive yourself to the hospital. Summary Nausea is the feeling that you have an upset stomach or that you are about to vomit. Nausea on its own is not usually a serious concern, but it may be an early sign of a more serious medical problem. If vomiting develops,  or if you are not able to drink enough fluids, you are at risk of becoming dehydrated. Follow recommendations for eating and drinking and take over-the-counter and prescription medicines only as told by your health care provider. Contact a health care provider right away if your symptoms worsen or you have new symptoms. Keep all follow-up visits. This is important. This information is not intended to replace advice given to you by your health care provider. Make sure you discuss any questions you have with your health care provider. Document Revised: 09/19/2020 Document Reviewed: 09/19/2020 Elsevier Patient Education  [place].
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
## 2021-02-25 NOTE — Progress Notes (Signed)
Patient takes medication at night and was able to eat today Patient Dx with COVID last week and tested negative this morning. Patient reports loss of appetite due to loss of smell. Patient denies pain at this time. Patient reports insomnia of waking up 5x a night even with current sleeping aide regimen.

## 2021-02-26 DIAGNOSIS — U071 COVID-19: Secondary | ICD-10-CM | POA: Insufficient documentation

## 2021-02-26 DIAGNOSIS — Z8619 Personal history of other infectious and parasitic diseases: Secondary | ICD-10-CM | POA: Insufficient documentation

## 2021-02-26 LAB — CERVICOVAGINAL ANCILLARY ONLY
Bacterial Vaginitis (gardnerella): POSITIVE — AB
Candida Glabrata: NEGATIVE
Candida Vaginitis: POSITIVE — AB
Chlamydia: NEGATIVE
Comment: NEGATIVE
Comment: NEGATIVE
Comment: NEGATIVE
Comment: NEGATIVE
Comment: NEGATIVE
Comment: NORMAL
Neisseria Gonorrhea: NEGATIVE
Trichomonas: POSITIVE — AB

## 2021-02-26 LAB — HIV ANTIBODY (ROUTINE TESTING W REFLEX): HIV Screen 4th Generation wRfx: NONREACTIVE

## 2021-02-26 LAB — RPR: RPR Ser Ql: NONREACTIVE

## 2021-02-27 MED ORDER — METRONIDAZOLE 500 MG PO TABS: 500.0000 mg | ORAL_TABLET | Freq: Two times a day (BID) | ORAL | 0 refills | Status: AC

## 2021-02-27 MED ORDER — FLUCONAZOLE 150 MG PO TABS: 150.0000 mg | ORAL_TABLET | Freq: Once | ORAL | 0 refills | Status: AC

## 2021-02-27 NOTE — Addendum Note (Signed)
Addended by: Kennieth Rad on: 02/27/2021 07:34 AM   Modules accepted: Orders

## 2021-03-03 ENCOUNTER — Telehealth: Payer: Self-pay | Admitting: *Deleted

## 2021-03-03 NOTE — Telephone Encounter (Signed)
-----   Message from Kennieth Rad, [place] sent at 02/27/2021  7:34 AM EST ----- Please call patient and let her know that she did test positive for trichomonas again, unsure if this is a new infection or if previous infection did not resolve.  She also tested positive for bacterial vaginitis and a yeast infection.  She will do a another course of metronidazole as well as a course of Diflucan.  She does need to present for a test of cure of the trichomonas 2 to 4 weeks after completing treatment once again needs to contact all partners to notify of her diagnosis as well as avoid sexual intercourse until trichomonas is resolved.  Her screening for HIV and syphilis were negative.

## 2021-03-03 NOTE — Telephone Encounter (Signed)
Nurse Phyilis is aware of results

## 2021-03-09 ENCOUNTER — Ambulatory Visit: Payer: Medicaid Other | Admitting: Physician Assistant

## 2021-03-09 ENCOUNTER — Other Ambulatory Visit: Payer: Self-pay

## 2021-03-09 VITALS — BP 139/88 | HR 84 | Temp 98.2°F | Resp 18 | Ht 65.0 in | Wt 153.0 lb

## 2021-03-09 DIAGNOSIS — R7303 Prediabetes: Secondary | ICD-10-CM | POA: Diagnosis not present

## 2021-03-09 DIAGNOSIS — F5104 Psychophysiologic insomnia: Secondary | ICD-10-CM

## 2021-03-09 DIAGNOSIS — Z8616 Personal history of COVID-19: Secondary | ICD-10-CM

## 2021-03-09 DIAGNOSIS — B379 Candidiasis, unspecified: Secondary | ICD-10-CM

## 2021-03-09 DIAGNOSIS — F1490 Cocaine use, unspecified, uncomplicated: Secondary | ICD-10-CM

## 2021-03-09 DIAGNOSIS — D509 Iron deficiency anemia, unspecified: Secondary | ICD-10-CM

## 2021-03-09 DIAGNOSIS — A599 Trichomoniasis, unspecified: Secondary | ICD-10-CM

## 2021-03-09 DIAGNOSIS — E559 Vitamin D deficiency, unspecified: Secondary | ICD-10-CM | POA: Diagnosis not present

## 2021-03-09 DIAGNOSIS — Z6825 Body mass index (BMI) 25.0-25.9, adult: Secondary | ICD-10-CM

## 2021-03-09 DIAGNOSIS — F200 Paranoid schizophrenia: Secondary | ICD-10-CM | POA: Diagnosis not present

## 2021-03-09 LAB — POCT GLYCOSYLATED HEMOGLOBIN (HGB A1C): Hemoglobin A1C: 6.3 % — AB (ref 4.0–5.6)

## 2021-03-09 MED ORDER — METFORMIN HCL 500 MG PO TABS: 500.0000 mg | ORAL_TABLET | Freq: Every day | ORAL | 1 refills | Status: DC

## 2021-03-09 MED ORDER — QUETIAPINE FUMARATE 150 MG PO TABS: 150.0000 mg | ORAL_TABLET | Freq: Every day | ORAL | 1 refills | Status: DC
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
## 2021-03-09 NOTE — Patient Instructions (Signed)
You are going to take an increased dose of Seroquel, 150 mg.  You do have prediabetes, you are going to restart taking metformin 500 mg with breakfast.  I do encourage you to follow a low sugar diet.  Please return to the mobile unit in the next 2 to 3 weeks for follow-up.  Kennieth Rad, PA-C Physician Assistant [facility] [email or web address]   Prediabetes Prediabetes is when your blood sugar (blood glucose) level is higher than normal but not high enough for you to be diagnosed with type 2 diabetes. Having prediabetes puts you at risk for developing type 2 diabetes (type 2 diabetes mellitus). With certain lifestyle changes, you may be able to prevent or delay the onset of type 2 diabetes. This is important because type 2 diabetes can lead to serious complications, such as: Heart disease. Stroke. Blindness. Kidney disease. Depression. Poor circulation in the feet and legs. In severe cases, this could lead to surgical removal of a leg (amputation). What are the causes? The exact cause of prediabetes is not known. It may result from insulin resistance. Insulin resistance develops when cells in the body do not respond properly to insulin that the body makes. This can cause excess glucose to build up in the blood. High blood glucose (hyperglycemia) can develop. What increases the risk? The following factors may make you more likely to develop this condition: You have a family member with type 2 diabetes. You are older than 45 years. You had a temporary form of diabetes during a pregnancy (gestational diabetes). You had polycystic ovary syndrome (PCOS). You are overweight or obese. You are inactive (sedentary). You have a history of heart disease, including problems with cholesterol levels, high levels of blood fats, or high blood pressure. What are the signs or symptoms? You may have no symptoms. If you do have symptoms, they may  include: Increased hunger. Increased thirst. Increased urination. Vision changes, such as blurry vision. Tiredness (fatigue). How is this diagnosed? This condition can be diagnosed with blood tests. Your blood glucose may be checked with one or more of the following tests: A fasting blood glucose (FBG) test. You will not be allowed to eat (you will fast) for at least 8 hours before a blood sample is taken. An A1C blood test (hemoglobin A1C). This test provides information about blood glucose levels over the previous 2?3 months. An oral glucose tolerance test (OGTT). This test measures your blood glucose at two points in time: After fasting. This is your baseline level. Two hours after you drink a beverage that contains glucose. You may be diagnosed with prediabetes if: Your FBG is 100?125 mg/dL (5.6-6.9 mmol/L). Your A1C level is 5.7?6.4% (39-46 mmol/mol). Your OGTT result is 140?199 mg/dL (7.8-11 mmol/L). These blood tests may be repeated to confirm your diagnosis. How is this treated? Treatment may include dietary and lifestyle changes to help lower your blood glucose and prevent type 2 diabetes from developing. In some cases, medicine may be prescribed to help lower the risk of type 2 diabetes. Follow these instructions at home: Nutrition  Follow a healthy meal plan. This includes eating lean proteins, whole grains, legumes, fresh fruits and vegetables, low-fat dairy products, and healthy fats. Follow instructions from your health care provider about eating or drinking restrictions. Meet with a dietitian to create a healthy eating plan that is right for you. Lifestyle Do moderate-intensity exercise for at least 30 minutes a day on 5 or more days each week, or as told by  your health care provider. A mix of activities may be best, such as: Brisk walking, swimming, biking, and weight lifting. Lose weight as told by your health care provider. Losing 5-7% of your body weight can reverse  insulin resistance. Do not drink alcohol if: Your health care provider tells you not to drink. You are pregnant, may be pregnant, or are planning to become pregnant. If you drink alcohol: Limit how much you use to: 0-1 drink a day for women. 0-2 drinks a day for men. Be aware of how much alcohol is in your drink. In the [place] one drink equals one 12 oz bottle of beer (355 mL), one 5 oz glass of wine (148 mL), or one 1 oz glass of hard liquor (44 mL). General instructions Take over-the-counter and prescription medicines only as told by your health care provider. You may be prescribed medicines that help lower the risk of type 2 diabetes. Do not use any products that contain nicotine or tobacco, such as cigarettes, e-cigarettes, and chewing tobacco. If you need help quitting, ask your health care provider. Keep all follow-up visits. This is important. Where to find more information American Diabetes Association: www.diabetes.org Academy of Nutrition and Dietetics: www.eatright.org American Heart Association: www.heart.org Contact a health care provider if: You have any of these symptoms: Increased hunger. Increased urination. Increased thirst. Fatigue. Vision changes, such as blurry vision. Get help right away if you: Have shortness of breath. Feel confused. Vomit or feel like you may vomit. Summary Prediabetes is when your blood sugar (blood glucose)level is higher than normal but not high enough for you to be diagnosed with type 2 diabetes. Having prediabetes puts you at risk for developing type 2 diabetes (type 2 diabetes mellitus). Make lifestyle changes such as eating a healthy diet and exercising regularly to help prevent diabetes. Lose weight as told by your health care provider. This information is not intended to replace advice given to you by your health care provider. Make sure you discuss any questions you have with your health care provider. Document Revised: 06/14/2019  Document Reviewed: 06/14/2019 Elsevier Patient Education  [place].
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
## 2021-03-10 LAB — IRON,TIBC AND FERRITIN PANEL
Ferritin: 17 ng/mL (ref 15–150)
Iron Saturation: 17 % (ref 15–55)
Iron: 66 ug/dL (ref 27–159)
Total Iron Binding Capacity: 399 ug/dL (ref 250–450)
UIBC: 333 ug/dL (ref 131–425)

## 2021-03-10 LAB — CBC WITH DIFFERENTIAL/PLATELET
Basophils Absolute: 0.1 10*3/uL (ref 0.0–0.2)
Basos: 1 %
EOS (ABSOLUTE): 0.2 10*3/uL (ref 0.0–0.4)
Eos: 4 %
Hematocrit: 38.4 % (ref 34.0–46.6)
Hemoglobin: 12.9 g/dL (ref 11.1–15.9)
Immature Grans (Abs): 0.1 10*3/uL (ref 0.0–0.1)
Immature Granulocytes: 1 %
Lymphocytes Absolute: 1.6 10*3/uL (ref 0.7–3.1)
Lymphs: 26 %
MCH: 29.5 pg (ref 26.6–33.0)
MCHC: 33.6 g/dL (ref 31.5–35.7)
MCV: 88 fL (ref 79–97)
Monocytes Absolute: 0.9 10*3/uL (ref 0.1–0.9)
Monocytes: 15 %
Neutrophils Absolute: 3.3 10*3/uL (ref 1.4–7.0)
Neutrophils: 53 %
Platelets: 319 10*3/uL (ref 150–450)
RBC: 4.37 x10E6/uL (ref 3.77–5.28)
RDW: 13.4 % (ref 11.7–15.4)
WBC: 6.1 10*3/uL (ref 3.4–10.8)

## 2021-03-10 LAB — VITAMIN D 25 HYDROXY (VIT D DEFICIENCY, FRACTURES): Vit D, 25-Hydroxy: 11.3 ng/mL — ABNORMAL LOW (ref 30.0–100.0)

## 2021-03-10 MED ORDER — VITAMIN D (ERGOCALCIFEROL) 1.25 MG (50000 UNIT) PO CAPS: 50000.0000 [IU] | ORAL_CAPSULE | ORAL | 2 refills | Status: DC

## 2021-03-10 NOTE — Addendum Note (Signed)
Addended by: Kennieth Rad on: 03/10/2021 10:51 AM   Modules accepted: Orders

## 2021-03-16 ENCOUNTER — Telehealth: Payer: Self-pay | Admitting: *Deleted

## 2021-03-16 NOTE — Telephone Encounter (Signed)
-----   Message from Kennieth Rad, [place] sent at 03/10/2021 10:50 AM EST ----- Please call patient and let her know that she does not need to resume iron at this time, however she does need to do  another 12 weeks of once weekly dosing of vitamin D.  Prescription sent to pharmacy.

## 2021-03-16 NOTE — Telephone Encounter (Signed)
Phylis RN with [facility] is aware of labs results.

## 2021-03-24 ENCOUNTER — Ambulatory Visit: Payer: Medicaid Other | Admitting: Physician Assistant

## 2021-03-24 ENCOUNTER — Other Ambulatory Visit (HOSPITAL_COMMUNITY)
Admission: RE | Admit: 2021-03-24 | Discharge: 2021-03-24 | Disposition: A | Discharge status: Home / Self Care | Payer: Medicaid Other | Source: Ambulatory Visit | Attending: Physician Assistant | Admitting: Physician Assistant

## 2021-03-24 ENCOUNTER — Other Ambulatory Visit: Payer: Self-pay

## 2021-03-24 VITALS — BP 121/74 | HR 74 | Temp 98.2°F | Resp 18 | Ht 64.0 in | Wt 154.0 lb

## 2021-03-24 DIAGNOSIS — E559 Vitamin D deficiency, unspecified: Secondary | ICD-10-CM

## 2021-03-24 DIAGNOSIS — R110 Nausea: Secondary | ICD-10-CM

## 2021-03-24 DIAGNOSIS — F5104 Psychophysiologic insomnia: Secondary | ICD-10-CM

## 2021-03-24 DIAGNOSIS — F200 Paranoid schizophrenia: Secondary | ICD-10-CM | POA: Diagnosis not present

## 2021-03-24 DIAGNOSIS — D509 Iron deficiency anemia, unspecified: Secondary | ICD-10-CM

## 2021-03-24 DIAGNOSIS — Z8619 Personal history of other infectious and parasitic diseases: Secondary | ICD-10-CM

## 2021-03-24 DIAGNOSIS — N939 Abnormal uterine and vaginal bleeding, unspecified: Secondary | ICD-10-CM

## 2021-03-24 DIAGNOSIS — R7303 Prediabetes: Secondary | ICD-10-CM

## 2021-03-24 DIAGNOSIS — K219 Gastro-esophageal reflux disease without esophagitis: Secondary | ICD-10-CM

## 2021-03-24 MED ORDER — TRAZODONE HCL 150 MG PO TABS: ORAL_TABLET | ORAL | 1 refills | Status: DC

## 2021-03-24 MED ORDER — OMEPRAZOLE 20 MG PO CPDR: 20.0000 mg | DELAYED_RELEASE_CAPSULE | Freq: Every day | ORAL | 3 refills | Status: DC

## 2021-03-24 MED ORDER — ONDANSETRON HCL 4 MG PO TABS: 4.0000 mg | ORAL_TABLET | Freq: Three times a day (TID) | ORAL | 0 refills | Status: DC | PRN

## 2021-03-24 MED ORDER — MEGESTROL ACETATE 20 MG PO TABS: 20.0000 mg | ORAL_TABLET | Freq: Every day | ORAL | 0 refills | Status: AC
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
## 2021-03-24 NOTE — Patient Instructions (Signed)
To help with your excessive bleeding, you are going to take Megace once daily for the next 7 days.  To help with your daily nausea, you are going to start taking Prilosec on a daily basis and continue using Zofran as needed.  To help with your insomnia, you are going to take trazodone 150 mg along with your Seroquel.  Please feel free to return to the mobile unit if needed prior to your office visit with Dr. Wynetta Emery on May 11, 2021.  Kennieth Rad, PA-C Physician Assistant [facility] [email or web address]   Perimenopause Perimenopause is the normal time of a woman's life when the levels of estrogen, the female hormone produced by the ovaries, begin to decrease. This leads to changes in menstrual periods before they stop completely (menopause). Perimenopause can begin 2-8 years before menopause. During perimenopause, the ovaries may or may not produce an egg and a woman can still become pregnant. What are the causes? This condition is caused by a natural change in hormone levels that happens as you get older. What increases the risk? This condition is more likely to start at an earlier age if you have certain medical conditions or have undergone treatments, including: A tumor of the pituitary gland in the brain. A disease that affects the ovaries and hormone production. Certain cancer treatments, such as chemotherapy or hormone therapy, or radiation therapy on the pelvis. Heavy smoking and excessive alcohol use. Family history of early menopause. What are the signs or symptoms? Perimenopausal changes affect each woman differently. Symptoms of this condition may include: Hot flashes. Irregular menstrual periods. Night sweats. Changes in feelings about sex. This could be a decrease in sex drive or an increased discomfort around your sexuality. Vaginal dryness. Headaches. Mood swings. Depression. Problems sleeping  (insomnia). Memory problems or trouble concentrating. Irritability. Tiredness. Weight gain. Anxiety. Trouble getting pregnant. How is this diagnosed? This condition is diagnosed based on your medical history, a physical exam, your age, your menstrual history, and your symptoms. Hormone tests may also be done. How is this treated? In some cases, no treatment is needed. You and your health care provider should make a decision together about whether treatment is necessary. Treatment will be based on your individual condition and preferences. Various treatments are available, such as: Menopausal hormone therapy (MHT). Medicines to treat specific symptoms. Acupuncture. Vitamin or herbal supplements. Before starting treatment, make sure to let your health care provider know if you have a personal or family history of: Heart disease. Breast cancer. Blood clots. Diabetes. Osteoporosis. Follow these instructions at home: Medicines Take over-the-counter and prescription medicines only as told by your health care provider. Take vitamin supplements only as told by your health care provider. Talk with your health care provider before starting any herbal supplements. Lifestyle  Do not use any products that contain nicotine or tobacco, such as cigarettes, e-cigarettes, and chewing tobacco. If you need help quitting, ask your health care provider. Get at least 30 minutes of physical activity on 5 or more days each week. Eat a balanced diet that includes fresh fruits and vegetables, whole grains, soybeans, eggs, lean meat, and low-fat dairy. Avoid alcoholic and caffeinated beverages, as well as spicy foods. This may help prevent hot flashes. Get 7-8 hours of sleep each night. Dress in layers that can be removed to help you manage hot flashes. Find ways to manage stress, such as deep breathing, meditation, or journaling. General instructions  Keep track of your menstrual periods, including: When  they occur. How heavy they are and how long they last. How much time passes between periods. Keep track of your symptoms, noting when they start, how often you have them, and how long they last. Use vaginal lubricants or moisturizers to help with vaginal dryness and improve comfort during sex. You can still become pregnant if you are having irregular periods. Make sure you use contraception during perimenopause if you do not want to get pregnant. Keep all follow-up visits. This is important. This includes any group therapy or counseling. Contact a health care provider if: You have heavy vaginal bleeding or pass blood clots. Your period lasts more than 2 days longer than normal. Your periods are recurring sooner than 21 days. You bleed after having sex. You have pain during sex. Get help right away if you have: Chest pain, trouble breathing, or trouble talking. Severe depression. Pain when you urinate. Severe headaches. Vision problems. Summary Perimenopause is the time when a woman's body begins to move into menopause. This may happen naturally or as a result of other health problems or medical treatments. Perimenopause can begin 2-8 years before menopause, and it can last for several years. Perimenopausal symptoms can be managed through medicines, lifestyle changes, and complementary therapies such as acupuncture. This information is not intended to replace advice given to you by your health care provider. Make sure you discuss any questions you have with your health care provider. Document Revised: 08/30/2019 Document Reviewed: 08/30/2019 Elsevier Patient Education  [place].

## 2021-03-24 NOTE — Progress Notes (Signed)
Patient has eaten and taken medication today. Patient reports her period being present for most of the month.

## 2021-03-25 LAB — CBC WITH DIFFERENTIAL/PLATELET
Basophils Absolute: 0.1 10*3/uL (ref 0.0–0.2)
Basos: 1 %
EOS (ABSOLUTE): 0.3 10*3/uL (ref 0.0–0.4)
Eos: 6 %
Hematocrit: 37.7 % (ref 34.0–46.6)
Hemoglobin: 12.7 g/dL (ref 11.1–15.9)
Immature Grans (Abs): 0 10*3/uL (ref 0.0–0.1)
Immature Granulocytes: 0 %
Lymphocytes Absolute: 1.8 10*3/uL (ref 0.7–3.1)
Lymphs: 34 %
MCH: 29.6 pg (ref 26.6–33.0)
MCHC: 33.7 g/dL (ref 31.5–35.7)
MCV: 88 fL (ref 79–97)
Monocytes Absolute: 0.7 10*3/uL (ref 0.1–0.9)
Monocytes: 12 %
Neutrophils Absolute: 2.5 10*3/uL (ref 1.4–7.0)
Neutrophils: 47 %
Platelets: 253 10*3/uL (ref 150–450)
RBC: 4.29 x10E6/uL (ref 3.77–5.28)
RDW: 13.7 % (ref 11.7–15.4)
WBC: 5.4 10*3/uL (ref 3.4–10.8)

## 2021-03-25 LAB — CERVICOVAGINAL ANCILLARY ONLY
Bacterial Vaginitis (gardnerella): NEGATIVE
Candida Glabrata: NEGATIVE
Candida Vaginitis: NEGATIVE
Chlamydia: NEGATIVE
Comment: NEGATIVE
Comment: NEGATIVE
Comment: NEGATIVE
Comment: NEGATIVE
Comment: NEGATIVE
Comment: NORMAL
Neisseria Gonorrhea: NEGATIVE
Trichomonas: NEGATIVE

## 2021-03-25 MED ORDER — TRAZODONE HCL 150 MG PO TABS: 150.0000 mg | ORAL_TABLET | Freq: Every evening | ORAL | 1 refills | Status: DC | PRN

## 2021-03-26 NOTE — Progress Notes (Signed)
MA attempted the RN at [facility] twice today with no success to share results, will call Tuesday upon [facility] return.

## 2021-04-06 ENCOUNTER — Ambulatory Visit: Payer: Self-pay

## 2021-04-06 NOTE — Telephone Encounter (Signed)
Patient verified DOB Patient is aware of results and will follow up with PCP 2/13. Patient is aware of refills being at [facility] for pickup and following up with Gyn and PCP for additional refills past February.

## 2021-04-06 NOTE — Telephone Encounter (Signed)
° °  Chief Complaint: Numbness left inner thigh Symptoms: Numbness comes and goes Frequency: Started 2 weeks ago Pertinent Negatives: Patient denies weakness to leg Disposition: [] ED /[] Urgent Care (no appt availability in office) / [] Appointment(In office/virtual)/ []  [facility]/ [] Home Care/ [] Refused Recommended Disposition /[] [facility]/ []  Follow-up with PCP Additional Notes: Requests appointment in 2-3 days - has to arrange transportation. Requests refills on Seroquel and Trazodone.    Answer Assessment - Initial Assessment Questions 1. SYMPTOM: "What is the main symptom you are concerned about?" (e.g., weakness, numbness)     Left numbness to outer thigh 2. ONSET: "When did this start?" (minutes, hours, days; while sleeping)     2 weeks ago 3. LAST NORMAL: "When was the last time you (the patient) were normal (no symptoms)?"     2 weeks 4. PATTERN "Does this come and go, or has it been constant since it started?"  "Is it present now?"     Comes and goes - more at night 5. CARDIAC SYMPTOMS: "Have you had any of the following symptoms: chest pain, difficulty breathing, palpitations?"     No 6. NEUROLOGIC SYMPTOMS: "Have you had any of the following symptoms: headache, dizziness, vision loss, double vision, changes in speech, unsteady on your feet?"     No 7. OTHER SYMPTOMS: "Do you have any other symptoms?"     No 8. PREGNANCY: "Is there any chance you are pregnant?" "When was your last menstrual period?"     No  Protocols used: Neurologic Deficit-A-AH

## 2021-04-10 DIAGNOSIS — F1490 Cocaine use, unspecified, uncomplicated: Secondary | ICD-10-CM | POA: Insufficient documentation

## 2021-04-11 ENCOUNTER — Other Ambulatory Visit: Payer: Self-pay

## 2021-04-11 ENCOUNTER — Encounter (HOSPITAL_COMMUNITY): Payer: Self-pay | Admitting: Emergency Medicine

## 2021-04-11 ENCOUNTER — Emergency Department (HOSPITAL_COMMUNITY)
Admission: EM | Admit: 2021-04-11 | Discharge: 2021-04-13 | Disposition: A | Discharge status: Home / Self Care | Payer: Medicaid Other | Source: Home / Self Care | Attending: Emergency Medicine | Admitting: Emergency Medicine

## 2021-04-11 DIAGNOSIS — Z8616 Personal history of COVID-19: Secondary | ICD-10-CM | POA: Insufficient documentation

## 2021-04-11 DIAGNOSIS — F209 Schizophrenia, unspecified: Secondary | ICD-10-CM | POA: Diagnosis present

## 2021-04-11 DIAGNOSIS — F200 Paranoid schizophrenia: Secondary | ICD-10-CM | POA: Diagnosis present

## 2021-04-11 DIAGNOSIS — Y900 Blood alcohol level of less than 20 mg/100 ml: Secondary | ICD-10-CM | POA: Insufficient documentation

## 2021-04-11 DIAGNOSIS — Z7984 Long term (current) use of oral hypoglycemic drugs: Secondary | ICD-10-CM | POA: Insufficient documentation

## 2021-04-11 DIAGNOSIS — F4323 Adjustment disorder with mixed anxiety and depressed mood: Secondary | ICD-10-CM | POA: Diagnosis present

## 2021-04-11 DIAGNOSIS — Z20822 Contact with and (suspected) exposure to covid-19: Secondary | ICD-10-CM | POA: Insufficient documentation

## 2021-04-11 DIAGNOSIS — F1590 Other stimulant use, unspecified, uncomplicated: Secondary | ICD-10-CM | POA: Diagnosis present

## 2021-04-11 DIAGNOSIS — F1410 Cocaine abuse, uncomplicated: Secondary | ICD-10-CM | POA: Diagnosis present

## 2021-04-11 DIAGNOSIS — Z79899 Other long term (current) drug therapy: Secondary | ICD-10-CM | POA: Insufficient documentation

## 2021-04-11 DIAGNOSIS — F1994 Other psychoactive substance use, unspecified with psychoactive substance-induced mood disorder: Secondary | ICD-10-CM | POA: Diagnosis present

## 2021-04-11 DIAGNOSIS — F29 Unspecified psychosis not due to a substance or known physiological condition: Secondary | ICD-10-CM

## 2021-04-11 DIAGNOSIS — F1420 Cocaine dependence, uncomplicated: Secondary | ICD-10-CM | POA: Insufficient documentation

## 2021-04-11 DIAGNOSIS — F203 Undifferentiated schizophrenia: Secondary | ICD-10-CM | POA: Diagnosis present

## 2021-04-11 DIAGNOSIS — R45851 Suicidal ideations: Secondary | ICD-10-CM | POA: Insufficient documentation

## 2021-04-11 LAB — COMPREHENSIVE METABOLIC PANEL
ALT: 24 U/L (ref 0–44)
AST: 24 U/L (ref 15–41)
Albumin: 3.6 g/dL (ref 3.5–5.0)
Alkaline Phosphatase: 79 U/L (ref 38–126)
Anion gap: 4 — ABNORMAL LOW (ref 5–15)
BUN: 13 mg/dL (ref 6–20)
CO2: 22 mmol/L (ref 22–32)
Calcium: 8.7 mg/dL — ABNORMAL LOW (ref 8.9–10.3)
Chloride: 110 mmol/L (ref 98–111)
Creatinine, Ser: 0.74 mg/dL (ref 0.44–1.00)
GFR, Estimated: >60 mL/min (ref >60)
Glucose, Bld: 99 mg/dL (ref 70–99)
Potassium: 4 mmol/L (ref 3.5–5.1)
Sodium: 136 mmol/L (ref 135–145)
Total Bilirubin: 0.6 mg/dL (ref 0.3–1.2)
Total Protein: 7.3 g/dL (ref 6.5–8.1)

## 2021-04-11 LAB — CBC
HCT: 39.1 % (ref 36.0–46.0)
Hemoglobin: 12.6 g/dL (ref 12.0–15.0)
MCH: 30.1 pg (ref 26.0–34.0)
MCHC: 32.2 g/dL (ref 30.0–36.0)
MCV: 93.5 fL (ref 80.0–100.0)
Platelets: 311 10*3/uL (ref 150–400)
RBC: 4.18 MIL/uL (ref 3.87–5.11)
RDW: 15.9 % — ABNORMAL HIGH (ref 11.5–15.5)
WBC: 7.8 10*3/uL (ref 4.0–10.5)
nRBC: 0 % (ref 0.0–0.2)

## 2021-04-11 LAB — RAPID URINE DRUG SCREEN, HOSP PERFORMED
Amphetamines: NOT DETECTED
Barbiturates: NOT DETECTED
Benzodiazepines: NOT DETECTED
Cocaine: NOT DETECTED
Opiates: NOT DETECTED
Tetrahydrocannabinol: NOT DETECTED

## 2021-04-11 LAB — SALICYLATE LEVEL: Salicylate Lvl: <7 mg/dL — ABNORMAL LOW (ref 7.0–30.0)

## 2021-04-11 LAB — I-STAT BETA HCG BLOOD, ED (MC, WL, AP ONLY): I-stat hCG, quantitative: <5 m[IU]/mL (ref <5)

## 2021-04-11 LAB — ACETAMINOPHEN LEVEL: Acetaminophen (Tylenol), Serum: <10 ug/mL — ABNORMAL LOW (ref 10–30)

## 2021-04-11 LAB — ETHANOL: Alcohol, Ethyl (B): <10 mg/dL (ref <10)

## 2021-04-11 MED ORDER — LORAZEPAM 1 MG PO TABS
1.0000 mg | ORAL_TABLET | Freq: Once | ORAL | Status: AC
  Administered 2021-04-11: 1 mg via ORAL
  Filled 2021-04-11: qty 1

## 2021-04-11 NOTE — ED Notes (Signed)
Pt moved to room 9 for TTS
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
## 2021-04-11 NOTE — ED Notes (Signed)
Pt resting in recliner chair. A/ox4. Pt states she was clean 5 years, then moved and her son was killed and she started using again. Pt +SI, -HI. Denies hallucinations. Pt cooperative, in purple scrubs with sitter at chairside. Pt requests cola, given.

## 2021-04-11 NOTE — ED Triage Notes (Addendum)
Pt states she has been living in [place] and "clean" since November.  States she used cocaine yesterday and wanted to overdose on pills but people that lived there were watching her.  States she is suicidal because her son died last week in [place].
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
## 2021-04-11 NOTE — ED Notes (Addendum)
Pt changed into burgundy scrubs.  States she is unable to remove all of her rings. Instructed her she will need to remove everything that can be removed.  Security called to wand pt.  Un-inventoried belongings bagged and labeled and placed in [facility] [address].  3 bags total.

## 2021-04-11 NOTE — BH Assessment (Signed)
@  1745:  Sent secure chat to [employer] RN to request pt be moved to a private room/space for assessment. Per RN Gerald Stabs, "we are getting swamped with ems traffic and there are no empty rooms near by and I cannot go sit with her." Informed RN that TTS would reach out at a later time.

## 2021-04-12 MED ORDER — TRAZODONE HCL 50 MG PO TABS: 150.0000 mg | ORAL_TABLET | Freq: Every evening | ORAL | Status: DC | PRN

## 2021-04-12 MED ORDER — QUETIAPINE FUMARATE 100 MG PO TABS
150.0000 mg | ORAL_TABLET | Freq: Every day | ORAL | Status: DC
  Administered 2021-04-12: 150 mg via ORAL
  Filled 2021-04-12: qty 1.5
  Filled 2021-04-12: qty 2
  Filled 2021-04-12: qty 1.5

## 2021-04-12 NOTE — Progress Notes (Signed)
Patient meets criteria for inpatient treatment per Leandro Reasoner, NP. No appropriate beds at [facility] currently. CSW faxed referrals to the following facilities for review:  [facility]   [facility]   [facility]   [facility]   [facility]    TTS will continue to seek bed placement.     Darletta Moll MSW, LCSW Clincal Social Worker  [facility]

## 2021-04-12 NOTE — ED Provider Notes (Signed)
Blood pressure 126/69, pulse 86, temperature 98.4 F (36.9 C), temperature source Oral, resp. rate 16, SpO2 97 %.  In short, Joanne Gonzalez is a 56 y.o. female with a chief complaint of Suicidal .  Refer to the original H&P for additional details.  TTS evaluation complete. They are recommending inpatient psychiatry placement. [facility] at capacity. Will search for alternate placement.   Home psych medication ordered.     Margette Fast, MD 04/12/21 [phone]

## 2021-04-12 NOTE — ED Notes (Signed)
Patient noted to be resting at this time. Chest rise and fall observed.

## 2021-04-12 NOTE — ED Notes (Signed)
Seroquel held d/t not being ordered till 0130 and pt finally falling asleep prior which was left asleep due to pt's extreme anxiousness and anxiety prior to finally falling asleep.
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
## 2021-04-12 NOTE — ED Provider Notes (Signed)
Emergency Medicine Observation Re-evaluation Note  Ressie Slevin is a 56 y.o. female, seen on rounds today.  Pt initially presented to the ED for complaints of Suicidal Currently, the patient is resting.  Physical Exam  BP 121/77 (BP Location: Right Arm)    Pulse 81    Temp 98.1 F (36.7 C) (Oral)    Resp 18    SpO2 96%  Physical Exam General: NAD Lungs: no respiratory distress Psych: calm, cooperative  ED Course / MDM  EKG:   I have reviewed the labs performed to date as well as medications administered while in observation.  Recent changes in the last 24 hours include home meds ordered, TTS recommending placement.  Plan  Current plan is for placement per SW. Trinady Milewski is under involuntary commitment.      Jeanell Sparrow, DO 04/12/21 2101

## 2021-04-12 NOTE — ED Notes (Signed)
Patient out to desk to use phone

## 2021-04-12 NOTE — ED Notes (Signed)
Patient to desk for second phone call

## 2021-04-13 ENCOUNTER — Other Ambulatory Visit: Payer: Self-pay | Admitting: Registered Nurse

## 2021-04-13 ENCOUNTER — Inpatient Hospital Stay (HOSPITAL_COMMUNITY)
Admission: AD | Admit: 2021-04-13 | Discharge: 2021-04-22 | DRG: 885 | Disposition: A | Discharge status: Home / Self Care | Payer: Medicaid Other | Source: Intra-hospital | Attending: Psychiatry | Admitting: Psychiatry

## 2021-04-13 ENCOUNTER — Other Ambulatory Visit: Payer: Self-pay

## 2021-04-13 ENCOUNTER — Encounter (HOSPITAL_COMMUNITY): Payer: Self-pay | Admitting: Registered Nurse

## 2021-04-13 DIAGNOSIS — E739 Lactose intolerance, unspecified: Secondary | ICD-10-CM | POA: Diagnosis present

## 2021-04-13 DIAGNOSIS — F4323 Adjustment disorder with mixed anxiety and depressed mood: Secondary | ICD-10-CM | POA: Diagnosis present

## 2021-04-13 DIAGNOSIS — Z881 Allergy status to other antibiotic agents status: Secondary | ICD-10-CM

## 2021-04-13 DIAGNOSIS — K219 Gastro-esophageal reflux disease without esophagitis: Secondary | ICD-10-CM | POA: Diagnosis present

## 2021-04-13 DIAGNOSIS — Z20822 Contact with and (suspected) exposure to covid-19: Secondary | ICD-10-CM | POA: Diagnosis present

## 2021-04-13 DIAGNOSIS — R45850 Homicidal ideations: Secondary | ICD-10-CM | POA: Diagnosis present

## 2021-04-13 DIAGNOSIS — E785 Hyperlipidemia, unspecified: Secondary | ICD-10-CM | POA: Diagnosis not present

## 2021-04-13 DIAGNOSIS — N390 Urinary tract infection, site not specified: Secondary | ICD-10-CM | POA: Diagnosis present

## 2021-04-13 DIAGNOSIS — Z79899 Other long term (current) drug therapy: Secondary | ICD-10-CM

## 2021-04-13 DIAGNOSIS — F203 Undifferentiated schizophrenia: Principal | ICD-10-CM

## 2021-04-13 DIAGNOSIS — G629 Polyneuropathy, unspecified: Secondary | ICD-10-CM | POA: Diagnosis present

## 2021-04-13 DIAGNOSIS — F4310 Post-traumatic stress disorder, unspecified: Secondary | ICD-10-CM | POA: Diagnosis present

## 2021-04-13 DIAGNOSIS — G4700 Insomnia, unspecified: Secondary | ICD-10-CM | POA: Diagnosis present

## 2021-04-13 DIAGNOSIS — Z560 Unemployment, unspecified: Secondary | ICD-10-CM

## 2021-04-13 DIAGNOSIS — R109 Unspecified abdominal pain: Secondary | ICD-10-CM | POA: Diagnosis present

## 2021-04-13 DIAGNOSIS — F1490 Cocaine use, unspecified, uncomplicated: Secondary | ICD-10-CM | POA: Diagnosis present

## 2021-04-13 DIAGNOSIS — Z8673 Personal history of transient ischemic attack (TIA), and cerebral infarction without residual deficits: Secondary | ICD-10-CM | POA: Diagnosis not present

## 2021-04-13 DIAGNOSIS — R7303 Prediabetes: Secondary | ICD-10-CM | POA: Diagnosis present

## 2021-04-13 DIAGNOSIS — Z7984 Long term (current) use of oral hypoglycemic drugs: Secondary | ICD-10-CM

## 2021-04-13 DIAGNOSIS — R110 Nausea: Secondary | ICD-10-CM | POA: Diagnosis not present

## 2021-04-13 DIAGNOSIS — F332 Major depressive disorder, recurrent severe without psychotic features: Secondary | ICD-10-CM | POA: Diagnosis present

## 2021-04-13 DIAGNOSIS — Z634 Disappearance and death of family member: Secondary | ICD-10-CM | POA: Diagnosis not present

## 2021-04-13 DIAGNOSIS — F1590 Other stimulant use, unspecified, uncomplicated: Secondary | ICD-10-CM

## 2021-04-13 DIAGNOSIS — F1410 Cocaine abuse, uncomplicated: Secondary | ICD-10-CM | POA: Diagnosis present

## 2021-04-13 DIAGNOSIS — R45851 Suicidal ideations: Secondary | ICD-10-CM | POA: Diagnosis not present

## 2021-04-13 DIAGNOSIS — F29 Unspecified psychosis not due to a substance or known physiological condition: Secondary | ICD-10-CM | POA: Diagnosis present

## 2021-04-13 DIAGNOSIS — M79652 Pain in left thigh: Secondary | ICD-10-CM | POA: Diagnosis not present

## 2021-04-13 DIAGNOSIS — Z8249 Family history of ischemic heart disease and other diseases of the circulatory system: Secondary | ICD-10-CM | POA: Diagnosis not present

## 2021-04-13 DIAGNOSIS — Z8616 Personal history of COVID-19: Secondary | ICD-10-CM | POA: Diagnosis not present

## 2021-04-13 DIAGNOSIS — F17200 Nicotine dependence, unspecified, uncomplicated: Secondary | ICD-10-CM | POA: Diagnosis present

## 2021-04-13 DIAGNOSIS — E559 Vitamin D deficiency, unspecified: Secondary | ICD-10-CM | POA: Diagnosis present

## 2021-04-13 DIAGNOSIS — F1414 Cocaine abuse with cocaine-induced mood disorder: Secondary | ICD-10-CM | POA: Diagnosis not present

## 2021-04-13 DIAGNOSIS — F209 Schizophrenia, unspecified: Principal | ICD-10-CM

## 2021-04-13 DIAGNOSIS — F329 Major depressive disorder, single episode, unspecified: Secondary | ICD-10-CM | POA: Diagnosis not present

## 2021-04-13 DIAGNOSIS — Z880 Allergy status to penicillin: Secondary | ICD-10-CM

## 2021-04-13 LAB — RESP PANEL BY RT-PCR (FLU A&B, COVID) ARPGX2
Influenza A by PCR: NEGATIVE
Influenza B by PCR: NEGATIVE
SARS Coronavirus 2 by RT PCR: NEGATIVE

## 2021-04-13 MED ORDER — QUETIAPINE FUMARATE 50 MG PO TABS
150.0000 mg | ORAL_TABLET | Freq: Every day | ORAL | Status: DC
  Administered 2021-04-13 – 2021-04-14 (×2): 150 mg via ORAL
  Filled 2021-04-13 (×4): qty 3

## 2021-04-13 MED ORDER — ACETAMINOPHEN 325 MG PO TABS: 650.0000 mg | ORAL_TABLET | Freq: Four times a day (QID) | ORAL | Status: DC | PRN

## 2021-04-13 MED ORDER — PANTOPRAZOLE SODIUM 40 MG PO TBEC
40.0000 mg | DELAYED_RELEASE_TABLET | Freq: Every day | ORAL | Status: DC
  Administered 2021-04-15: 40 mg via ORAL
  Filled 2021-04-13 (×11): qty 1

## 2021-04-13 MED ORDER — ALUM & MAG HYDROXIDE-SIMETH 200-200-20 MG/5ML PO SUSP: 30.0000 mL | ORAL | Status: DC | PRN

## 2021-04-13 MED ORDER — TRAZODONE HCL 150 MG PO TABS
150.0000 mg | ORAL_TABLET | Freq: Every evening | ORAL | Status: DC | PRN
  Administered 2021-04-14 – 2021-04-21 (×5): 150 mg via ORAL
  Filled 2021-04-13 (×6): qty 1

## 2021-04-13 MED ORDER — METFORMIN HCL 500 MG PO TABS
500.0000 mg | ORAL_TABLET | Freq: Every day | ORAL | Status: DC
  Administered 2021-04-14 – 2021-04-22 (×9): 500 mg via ORAL
  Filled 2021-04-13 (×11): qty 1
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
## 2021-04-13 NOTE — ED Provider Notes (Signed)
Emergency Medicine Observation Re-evaluation Note  Joanne Gonzalez is a 56 y.o. female, seen on rounds today.  Pt initially presented to the ED for complaints of Suicidal Currently, the patient is awaiting transport.  Physical Exam  BP 137/76 (BP Location: Right Arm)    Pulse 84    Temp 98.5 F (36.9 C)    Resp 18    SpO2 100%  Physical Exam General: Nontoxic appearing Cardiac: Normal heart rate Lungs: Normal respiratory rate Psych: No internal responsiveness  ED Course / MDM  EKG:   I have reviewed the labs performed to date as well as medications administered while in observation.  Recent changes in the last 24 hours include rate COVID is normal.  Seen by psychiatry today who has accepted for transfer to the behavioral health hospital.  Plan  Current plan is for transfer today. Joanne Gonzalez is under involuntary commitment.      Daleen Bo, MD 04/13/21 [phone]

## 2021-04-13 NOTE — ED Notes (Signed)
Report called to [facility] and [facility] called for transport
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
## 2021-04-13 NOTE — ED Notes (Signed)
Patient resting. Even Resp. No distress noted at this time. Sitter at bedside.

## 2021-04-13 NOTE — ED Notes (Signed)
IVC paperwork faxed to [facility] 

## 2021-04-13 NOTE — ED Notes (Signed)
TTS done 

## 2021-04-13 NOTE — Progress Notes (Incomplete)
..  0                            0.000.

## 2021-04-13 NOTE — Tx Team (Signed)
Initial Treatment Plan 04/13/2021 6:33 PM Joanne Gonzalez KJZ:[id number]    PATIENT STRESSORS: Substance abuse   Other: unsure if she can return to [place]: Supportive family/friends    PATIENT IDENTIFIED PROBLEMS: Return to previous living arrangements   Depression   Anxiety  SI                DISCHARGE CRITERIA:  Adequate post-discharge living arrangements Improved stabilization in mood, thinking, and/or behavior  PRELIMINARY DISCHARGE PLAN: Return to previous living arrangement  PATIENT/FAMILY INVOLVEMENT: This treatment plan has been presented to and reviewed with the patient, Joanne Gonzalez.  The patient has been given the opportunity to ask questions and make suggestions.  Gerrianne Scale, RN 04/13/2021, 6:33 PM

## 2021-04-13 NOTE — ED Notes (Signed)
Breakfast orders placed 

## 2021-04-13 NOTE — ED Notes (Signed)
Pt transported to [facility] by [facility]

## 2021-04-13 NOTE — Progress Notes (Signed)
Adult Psychoeducational Group Note  Date:  04/13/2021 Time:  9:01 PM  Group Topic/Focus:  Wrap-Up Group:   The focus of this group is to help patients review their daily goal of treatment and discuss progress on daily workbooks.  Participation Level:  Minimal  Participation Quality:  Appropriate  Affect:  Depressed  Cognitive:  Appropriate  Insight: Improving  Engagement in Group:  Limited  Modes of Intervention:  Discussion  Additional Comments:  Pt attended the evening wrap-up group. Tech introduced the staff for the evening, reminded group of the evening schedule and reminded them to ask for anything they need. Group read the selected reading followed by a brief discussion. Pt reported feeling 5 out of 10 today, with 1 being the worst and 10 being the best.  The pt is currently aligned to their daily goal. Additionally, the pt reports having spoken to their son about their progress via phone. Pt subsequently explored their current concerns from the day. Pt reported they did not have any auditory hallucinations. Pt reported they did not have any visual hallucinations. Pt reported they are not currently having any thoughts of harming themselves or others. Pt reported having slept poor the previous night. Lastly, pt indicated they are currently experiencing a headache. Group ended with a reminder of when night medications will be dispensed and the rest of the evening schedule.   Amie Critchley 04/13/2021, 9:01 PM

## 2021-04-13 NOTE — Progress Notes (Signed)
[facility] LCSW Progress Note   04/13/2021    12:40 PM  Tyreesha Maharaj   [id number]   Type of Contact and Topic:  Psychiatric Bed Placement   Pt accepted to [facility] 503-02  Patient meets inpatient criteria per Leandro Reasoner, NP  The attending provider will be Dr. Berdine Addison   Call report to [phone]    Gordan Payment, RN @ [facility] ED notified.     Pt scheduled  to arrive at [facility] at 1400.   Mariea Clonts, MSW, LCSW-A  12:40 PM 04/13/2021

## 2021-04-14 DIAGNOSIS — F29 Unspecified psychosis not due to a substance or known physiological condition: Secondary | ICD-10-CM

## 2021-04-14 DIAGNOSIS — F203 Undifferentiated schizophrenia: Principal | ICD-10-CM

## 2021-04-14 LAB — GLUCOSE, CAPILLARY: Glucose-Capillary: 126 mg/dL — ABNORMAL HIGH (ref 70–99)

## 2021-04-14 MED ORDER — MIRTAZAPINE 15 MG PO TABS
15.0000 mg | ORAL_TABLET | Freq: Every day | ORAL | Status: DC
  Administered 2021-04-14 – 2021-04-18 (×5): 15 mg via ORAL
  Filled 2021-04-14 (×8): qty 1
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
## 2021-04-14 NOTE — Progress Notes (Signed)
°   04/14/21 0530  Sleep  Number of Hours 6.5
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
## 2021-04-14 NOTE — Progress Notes (Signed)
Pt was encouraged but didn't attend orientation/goals group.

## 2021-04-14 NOTE — BHH Suicide Risk Assessment (Signed)
[place] INPATIENT:  Family/Significant Other Suicide Prevention Education  Suicide Prevention Education:  Education Completed; Ayesha Rumpf, [place] roommate, [phone] (name of family member/significant other) has been identified by the patient as the family member/significant other with whom the patient will be residing, and identified as the person(s) who will aid the patient in the event of a mental health crisis (suicidal ideations/suicide attempt).  With written consent from the patient, the family member/significant other has been provided the following suicide prevention education, prior to the and/or following the discharge of the patient.  CSW spoke with patient [facility] roommate who reports that patient was voted back into the house but could not return until January 25. CSW agreed to let patient know.  Roommate could not speak to a lot of what was going on with patient prior to admission due to patient only being in the house for a couple of days prior to admission.  Roommate reports that environment is safe without any access to guns/weapons. Roommate had no additional safety concerns.   The suicide prevention education provided includes the following: Suicide risk factors Suicide prevention and interventions National Suicide Hotline telephone number [facility] assessment telephone number [facility] Emergency Assistance [place] and/or Residential Mobile Crisis Unit telephone number  Request made of family/significant other to: Remove weapons (e.g., guns, rifles, knives), all items previously/currently identified as safety concern.   Remove drugs/medications (over-the-counter, prescriptions, illicit drugs), all items previously/currently identified as a safety concern.  The family member/significant other verbalizes understanding of the suicide prevention education information provided.  The family member/significant other agrees to remove the items of  safety concern listed above.  Joanette Silveria E Storie Heffern 04/14/2021, 10:49 AM

## 2021-04-14 NOTE — Progress Notes (Signed)
DAR NOTE: Patient presents with anxious affect and depressed mood.  Reports auditory and visual hallucinations but denies SI/HI.  Rates depression at 7, hopelessness at 6, and anxiety at 6.  Maintained on routine safety checks.  Medications given as prescribed.  Support and encouragement offered as needed.  States goal for today is "getting back to [facility]."  Patient visible in milieu briefly.  Did not attend group when invited.  Patient is safe on and off the unit.

## 2021-04-14 NOTE — Group Note (Signed)
Recreation Therapy Group Note   Group Topic:Goal Setting  Group Date: 04/14/2021 Start Time: 5615 End Time: 3794 Facilitators: Victorino Sparrow, LRT,CTRS Location: [address] Dayroom   Goal Area(s) Addresses:  Patient will participate in discussion of what a goal is. Patient will successfully identify a goal for various time frames. Patient will identify the importance of goals post d/c.  Group Description: Group started with a discussion about group rules. Patients had a group conversation on SMART goals, and what the acronym stands for; specific, measurable, attainable, relevant, and time-bound. Patients were given a worksheet that gave four time frames (week, month, year and 5 years) to complete goals.  Patients then identified obstacles to prevent reaching goals, what's needed to achieve goals and what can be done now to start working towards goals.   Affect/Mood: N/A   Participation Level: Did not attend    Clinical Observations/Individualized Feedback:     Plan: Continue to engage patient in RT group sessions 2-3x/week.   Victorino Sparrow, LRT,CTRS 04/14/2021 11:51 AM

## 2021-04-14 NOTE — Progress Notes (Signed)
Pt depressed , stated she wanted to go back to the [place], pt encouraged to get grief counseling for the death of her son    05/09/2021 2300  Psych Admission Type (Psych Patients Only)  Admission Status Involuntary  Psychosocial Assessment  Patient Complaints Anxiety  Eye Contact Brief  Facial Expression Sad;Anxious  Affect Anxious;Depressed;Sad  Speech Logical/coherent;Soft  Interaction Assertive  Motor Activity Slow  Appearance/Hygiene In scrubs  Behavior Characteristics Cooperative;Anxious  Mood Depressed  Aggressive Behavior  Effect No apparent injury  Thought Process  Coherency WDL  Content Blaming others;Paranoia  Delusions None reported or observed  Perception Hallucinations  Hallucination Auditory;Visual  Judgment Poor  Confusion None  Danger to Self  Current suicidal ideation? Denies  Danger to Others  Danger to Others None reported or observed
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
## 2021-04-15 ENCOUNTER — Encounter (HOSPITAL_COMMUNITY): Payer: Self-pay

## 2021-04-15 LAB — LIPID PANEL
Cholesterol: 181 mg/dL (ref 0–200)
HDL: 41 mg/dL (ref >40)
LDL Cholesterol: 77 mg/dL (ref 0–99)
Total CHOL/HDL Ratio: 4.4 RATIO
Triglycerides: 317 mg/dL — ABNORMAL HIGH (ref <150)
VLDL: 63 mg/dL — ABNORMAL HIGH (ref 0–40)

## 2021-04-15 LAB — URINALYSIS, COMPLETE (UACMP) WITH MICROSCOPIC
Bacteria, UA: NONE SEEN
Bilirubin Urine: NEGATIVE
Glucose, UA: NEGATIVE mg/dL
Hgb urine dipstick: NEGATIVE
Ketones, ur: NEGATIVE mg/dL
Leukocytes,Ua: NEGATIVE
Nitrite: NEGATIVE
Protein, ur: NEGATIVE mg/dL
Specific Gravity, Urine: 1.017 (ref 1.005–1.030)
pH: 6 (ref 5.0–8.0)

## 2021-04-15 LAB — GLUCOSE, CAPILLARY: Glucose-Capillary: 111 mg/dL — ABNORMAL HIGH (ref 70–99)

## 2021-04-15 LAB — TSH: TSH: 1.038 u[IU]/mL (ref 0.350–4.500)

## 2021-04-15 LAB — HEMOGLOBIN A1C
Hgb A1c MFr Bld: 6 % — ABNORMAL HIGH (ref 4.8–5.6)
Mean Plasma Glucose: 125.5 mg/dL

## 2021-04-15 MED ORDER — ONDANSETRON HCL 4 MG PO TABS
4.0000 mg | ORAL_TABLET | Freq: Three times a day (TID) | ORAL | Status: DC | PRN
  Administered 2021-04-18: 4 mg via ORAL
  Filled 2021-04-15: qty 1

## 2021-04-15 MED ORDER — NICOTINE 7 MG/24HR TD PT24: 7.0000 mg | MEDICATED_PATCH | Freq: Every day | TRANSDERMAL | Status: DC | PRN

## 2021-04-15 MED ORDER — QUETIAPINE FUMARATE 200 MG PO TABS
200.0000 mg | ORAL_TABLET | Freq: Every day | ORAL | Status: DC
  Administered 2021-04-15: 200 mg via ORAL
  Filled 2021-04-15 (×2): qty 1

## 2021-04-15 NOTE — BHH Counselor (Signed)
Homeless shelter resources provided to patient.   Referral for [facility] ACTT submitted.    Shavaun Osterloh, LCSW, [place] Social Worker  [facility]

## 2021-04-15 NOTE — BHH Group Notes (Signed)
1400 Relaxation group.  A poem was read ''The [place]wl and the [place]'' in which patients were asked to identify times in which they over reacted to stress. The patients then participated in deep breathing exercises and positive affirmations to support relaxation.  Pt participated and was appropriate.

## 2021-04-15 NOTE — Progress Notes (Signed)
Pt visible on the unit some this evening, pt continues to appear depressed and responding to internal stimuli at times    04/15/21 2100  Psych Admission Type (Psych Patients Only)  Admission Status Involuntary  Psychosocial Assessment  Patient Complaints Worrying;Suspiciousness  Eye Contact Brief  Facial Expression Sad;Anxious  Affect Anxious;Depressed;Sad  Speech Logical/coherent;Soft  Interaction Assertive  Motor Activity Slow  Appearance/Hygiene In scrubs  Behavior Characteristics Cooperative  Mood Depressed  Aggressive Behavior  Effect No apparent injury  Thought Process  Coherency WDL  Content Paranoia  Delusions None reported or observed  Perception Hallucinations  Hallucination Auditory;Visual  Judgment Impaired  Confusion None  Danger to Self  Current suicidal ideation? Denies  Danger to Others  Danger to Others None reported or observed

## 2021-04-15 NOTE — Group Note (Signed)
Recreation Therapy Group Note   Group Topic:Team Building  Group Date: 04/15/2021 Start Time: 1005 End Time: 1035 Facilitators: Victorino Sparrow, LRT,CTRS Location: [address] Hall Dayroom   Goal Area(s) Addresses:  Patient will effectively work with peer towards shared goal.  Patient will identify skills used to make activity successful.  Patient will identify how skills used during activity can be applied to reach post d/c goals.   Group Description: [employer]. In teams of 5-6, patients were given 15 craft pipe cleaners. Using the materials provided, patients were instructed to compete againt the opposing team(s) to build the tallest free-standing structure from floor level. The activity was timed; difficulty increased by Probation officer as Pharmacist, hospital continued.  Systematically resources were removed with additional directions for example, placing one arm behind their back, working in silence, and shape stipulations. LRT facilitated post-activity discussion reviewing team processes and necessary communication skills involved in completion. Patients were encouraged to reflect how the skills utilized, or not utilized, in this activity can be incorporated to positively impact support systems post discharge.   Affect/Mood: N/A   Participation Level: Did not attend    Clinical Observations/Individualized Feedback:     Plan: Continue to engage patient in RT group sessions 2-3x/week.   Victorino Sparrow, LRT,CTRS 04/15/2021 12:11 PM

## 2021-04-15 NOTE — Progress Notes (Signed)
Recreation Therapy Notes  01.18.23 1307:  LRT attempted to complete recreation therapy assessment with pt.  Pt refused to complete assessment at this time.  LRT will attempt to complete assessment at a later time.      Victorino Sparrow, LRT,CTRS  Victorino Sparrow A 04/15/2021 1:39 PM

## 2021-04-15 NOTE — BHH Counselor (Signed)
CSW met with patient to discuss conversation that this CSW had with [facility]. Patient aware that she can go back but can't go back until January 25th.  CSW discussed about patient wishes to see son's body. Patient reports that her family can not financially support her to get back to [place] so she will be unable to go.  Patient reports that she has no other housing options until January 25th.  CSW agreed to provide patient homeless shelter resources in case patient is discharged before January 25th.   CSW also agreed to assist patient with a referral to ACTT through [facility].    Amair Shrout, LCSW, [place] Social Worker  [facility]

## 2021-04-15 NOTE — BHH Group Notes (Signed)
The focus of this group is to help patients review their daily goal of treatment and discuss progress on daily workbooks.  Pt was appropriate and attentive while attending wrap up group. Pt described her day as fuzzy. Pt stated that she continue to not be clear about somethings. Pt stated that she is still grieving the death of her son. Pts shared that she is trying to get medication management along with trying to get her mind clear.

## 2021-04-15 NOTE — Progress Notes (Signed)
Pt brought up urine sample to nurses station.  RN  put sample in fridge for lab to pick up tonight.

## 2021-04-15 NOTE — BH IP Treatment Plan (Signed)
Interdisciplinary Treatment and Diagnostic Plan Update  04/15/2021 Time of Session: 9:25am Joanne Gonzalez MRN: [id number]  Principal Diagnosis: Schizophrenia, unspecified ([place])  Secondary Diagnoses: Principal Problem:   Schizophrenia, unspecified ([place]) Active Problems:   Cocaine use   Cocaine abuse ([place])   Current Medications:  Current Facility-Administered Medications  Medication Dose Route Frequency Provider Last Rate Last Admin   acetaminophen (TYLENOL) tablet 650 mg  650 mg Oral Q6H PRN Rankin, Shuvon B, NP       alum & mag hydroxide-simeth (MAALOX/MYLANTA) 200-200-20 MG/5ML suspension 30 mL  30 mL Oral Q4H PRN Rankin, Shuvon B, NP       metFORMIN (GLUCOPHAGE) tablet 500 mg  500 mg Oral Q breakfast Rankin, Shuvon B, NP   500 mg at 04/15/21 0944   mirtazapine (REMERON) tablet 15 mg  15 mg Oral QHS Nelda Marseille, Amy E, MD   15 mg at 04/14/21 2108   ondansetron (ZOFRAN) tablet 4 mg  4 mg Oral Q8H PRN Harlow Asa, MD       pantoprazole (PROTONIX) EC tablet 40 mg  40 mg Oral Daily Rankin, Shuvon B, NP   40 mg at 04/15/21 0944   QUEtiapine (SEROQUEL) tablet 200 mg  200 mg Oral QHS Freida Busman, MD       traZODone (DESYREL) tablet 150 mg  150 mg Oral QHS PRN Rankin, Shuvon B, NP   150 mg at 04/14/21 2337   PTA Medications: Medications Prior to Admission  Medication Sig Dispense Refill Last Dose   metFORMIN (GLUCOPHAGE) 500 MG tablet Take 1 tablet (500 mg total) by mouth daily with breakfast. 30 tablet 1    omeprazole (PRILOSEC) 20 MG capsule Take 1 capsule (20 mg total) by mouth daily. (Patient taking differently: Take 20 mg by mouth daily as needed (for heartburn or reflux).) 30 capsule 3    ondansetron (ZOFRAN) 4 MG tablet Take 1 tablet (4 mg total) by mouth every 8 (eight) hours as needed for nausea or vomiting. (Patient not taking: Reported on 04/12/2021) 20 tablet 0    QUEtiapine 150 MG TABS Take 150 mg by mouth at bedtime. 30 tablet 1    traZODone (DESYREL) 150 MG tablet Take 1  tablet (150 mg total) by mouth at bedtime as needed for sleep. 30 tablet 1    Vitamin D, Ergocalciferol, (DRISDOL) 1.25 MG (50000 UNIT) CAPS capsule Take 1 capsule (50,000 Units total) by mouth every 7 (seven) days. (Patient taking differently: Take 50,000 Units by mouth every Tuesday.) 4 capsule 2     Patient Stressors: Substance abuse   Other: unsure if she can return to [facility]    Patient Strengths: Supportive family/friends   Treatment Modalities: Medication Management, Group therapy, Case management,  1 to 1 session with clinician, Psychoeducation, Recreational therapy.   Physician Treatment Plan for Primary Diagnosis: Schizophrenia, unspecified ([place]) Long Term Goal(s): Improvement in symptoms so as ready for discharge   Short Term Goals: Ability to identify changes in lifestyle to reduce recurrence of condition will improve Ability to verbalize feelings will improve Ability to disclose and discuss suicidal ideas Ability to demonstrate self-control will improve Ability to identify and develop effective coping behaviors will improve Ability to maintain clinical measurements within normal limits will improve Compliance with prescribed medications will improve Ability to identify triggers associated with substance abuse/mental health issues will improve  Medication Management: Evaluate patient's response, side effects, and tolerance of medication regimen.  Therapeutic Interventions: 1 to 1 sessions, Unit Group sessions and Medication administration.  Evaluation of Outcomes: Not Met  Physician Treatment Plan for Secondary Diagnosis: Principal Problem:   Schizophrenia, unspecified ([place]) Active Problems:   Cocaine use   Cocaine abuse ([place])  Long Term Goal(s): Improvement in symptoms so as ready for discharge   Short Term Goals: Ability to identify changes in lifestyle to reduce recurrence of condition will improve Ability to verbalize feelings will improve Ability to  disclose and discuss suicidal ideas Ability to demonstrate self-control will improve Ability to identify and develop effective coping behaviors will improve Ability to maintain clinical measurements within normal limits will improve Compliance with prescribed medications will improve Ability to identify triggers associated with substance abuse/mental health issues will improve     Medication Management: Evaluate patient's response, side effects, and tolerance of medication regimen.  Therapeutic Interventions: 1 to 1 sessions, Unit Group sessions and Medication administration.  Evaluation of Outcomes: Not Met   RN Treatment Plan for Primary Diagnosis: Schizophrenia, unspecified ([place]) Long Term Goal(s): Knowledge of disease and therapeutic regimen to maintain health will improve  Short Term Goals: Ability to remain free from injury will improve, Ability to participate in decision making will improve, Ability to verbalize feelings will improve, Ability to disclose and discuss suicidal ideas, and Ability to identify and develop effective coping behaviors will improve  Medication Management: RN will administer medications as ordered by provider, will assess and evaluate patient's response and provide education to patient for prescribed medication. RN will report any adverse and/or side effects to prescribing provider.  Therapeutic Interventions: 1 on 1 counseling sessions, Psychoeducation, Medication administration, Evaluate responses to treatment, Monitor vital signs and CBGs as ordered, Perform/monitor CIWA, COWS, AIMS and Fall Risk screenings as ordered, Perform wound care treatments as ordered.  Evaluation of Outcomes: Not Met   LCSW Treatment Plan for Primary Diagnosis: Schizophrenia, unspecified ([place]) Long Term Goal(s): Safe transition to appropriate next level of care at discharge, Engage patient in therapeutic group addressing interpersonal concerns.  Short Term Goals: Engage patient  in aftercare planning with referrals and resources, Increase social support, Increase emotional regulation, Facilitate acceptance of mental health diagnosis and concerns, Identify triggers associated with mental health/substance abuse issues, and Increase skills for wellness and recovery  Therapeutic Interventions: Assess for all discharge needs, 1 to 1 time with Social worker, Explore available resources and support systems, Assess for adequacy in community support network, Educate family and significant other(s) on suicide prevention, Complete Psychosocial Assessment, Interpersonal group therapy.  Evaluation of Outcomes: Not Met   Progress in Treatment: Attending groups: Yes. Participating in groups: Yes. Taking medication as prescribed: Yes. Toleration medication: Yes. Family/Significant other contact made: Yes, individual(s) contacted:  Roommate  Patient understands diagnosis: No. Discussing patient identified problems/goals with staff: Yes. Medical problems stabilized or resolved: Yes. Denies suicidal/homicidal ideation: Yes. Issues/concerns per patient self-inventory: No.   New problem(s) identified: No, Describe:  None   New Short Term/Long Term Goal(s): medication stabilization, elimination of SI thoughts, development of comprehensive mental wellness plan.   Patient Goals: "To clear my head of negative thoughts"   Discharge Plan or Barriers: Patient recently admitted. CSW will continue to follow and assess for appropriate referrals and possible discharge planning.   Reason for Continuation of Hospitalization: Depression Hallucinations Homicidal ideation Medication stabilization Suicidal ideation Withdrawal symptoms  Estimated Length of Stay: 3 to 5 days    Scribe for Treatment Team: Darleen Crocker, Latanya Presser 04/15/2021 2:50 PM

## 2021-04-15 NOTE — BHH Group Notes (Signed)
Adult Psychoeducational Group Note  Date:  04/15/2021 Time:  10:04 AM  Group Topic/Focus:  Goals Group:   The focus of this group is to help patients establish daily goals to achieve during treatment and discuss how the patient can incorporate goal setting into their daily lives to aide in recovery.  Participation Level:  Did Not Attend    Dub Mikes 04/15/2021, 10:04 AM

## 2021-04-15 NOTE — Progress Notes (Signed)
°   04/15/21 0545  Sleep  Number of Hours 7.75

## 2021-04-15 NOTE — Progress Notes (Signed)
Adult Psychoeducational Group Note  Date:  04/15/2021 Time:  1:32 AM  Group Topic/Focus:  Wrap-Up Group:   The focus of this group is to help patients review their daily goal of treatment and discuss progress on daily workbooks.  Participation Level:  Minimal  Participation Quality:  Appropriate  Affect:  Tearful  Cognitive:  Appropriate  Insight: Improving  Engagement in Group:  Limited  Modes of Intervention:  Discussion  Additional Comments:  Pt attended the evening wrap-up group. Tech introduced the staff for the evening, reminded group of the evening schedule and reminded them to ask for anything they need. Pt reported feeling 5 out of 10 today, with 1 being the worst and 10 being the best.  The pt is currently aligned to their daily goal. Pt subsequently explored their current concerns from the day. Pt reported they had auditory hallucinations. Pt reported they had visual hallucinations. Pt reported they are not currently having any thoughts of harming themselves or others. Pt reported having slept fair the previous night. Lastly, pt indicated they are not currently having any pain. Group ended with a reminder of when night medications will be dispensed and the rest of the evening schedule.   Joaquim Lai Gwenetta Devos 04/15/2021, 1:32 AM
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
## 2021-04-15 NOTE — Group Note (Signed)
LCSW Group Therapy Note  Group Date: 04/15/2021 Start Time: 1300 End Time: 1400   Type of Therapy and Topic:  Group Therapy: Positive Affirmations  Participation Level:  Did Not Attend   Description of Group:   This group addressed positive affirmation towards self and others.  Patients went around the room and identified two positive things about themselves and two positive things about a peer in the room.  Patients reflected on how it felt to share something positive with others, to identify positive things about themselves, and to hear positive things from others/ Patients were encouraged to have a daily reflection of positive characteristics or circumstances.   Therapeutic Goals: Patients will verbalize two of their positive qualities Patients will demonstrate empathy for others by stating two positive qualities about a peer in the group Patients will verbalize their feelings when voicing positive self affirmations and when voicing positive affirmations of others Patients will discuss the potential positive impact on their wellness/recovery of focusing on positive traits of self and others.  Summary of Patient Progress:  Did not attend  Therapeutic Modalities:   Cognitive [place], LCSW 04/15/2021  1:46 PM

## 2021-04-15 NOTE — Progress Notes (Signed)
Pt denies SI/HI and endorses AVH.  RN established rapport with pt and assessed for needs and concerns.  RN gave pt a specimen cup so that urine sample could be obtained.  Pt has not given back urine sample at this time.  Pt has good appetite and feels that her nauseated has subsided.  PRN zofran order available if needed.  04/15/21 0945  Psych Admission Type (Psych Patients Only)  Admission Status Involuntary  Psychosocial Assessment  Patient Complaints Other (Comment) (nauseated)  Eye Contact Brief  Facial Expression Sad;Anxious  Affect Anxious;Depressed;Sad  Speech Logical/coherent;Soft  Interaction Assertive  Motor Activity Slow  Appearance/Hygiene In scrubs  Behavior Characteristics Cooperative  Mood Depressed  Aggressive Behavior  Effect No apparent injury  Thought Process  Coherency WDL  Content Paranoia  Delusions None reported or observed  Perception Hallucinations  Hallucination Auditory;Visual  Judgment Impaired  Confusion None  Danger to Self  Current suicidal ideation? Denies  Danger to Others  Danger to Others None reported or observed

## 2021-04-16 LAB — GLUCOSE, CAPILLARY: Glucose-Capillary: 171 mg/dL — ABNORMAL HIGH (ref 70–99)

## 2021-04-16 MED ORDER — LORAZEPAM 1 MG PO TABS: 1.0000 mg | ORAL_TABLET | ORAL | Status: DC | PRN

## 2021-04-16 MED ORDER — ZIPRASIDONE MESYLATE 20 MG IM SOLR: 20.0000 mg | INTRAMUSCULAR | Status: DC | PRN

## 2021-04-16 MED ORDER — RISPERIDONE 2 MG PO TBDP: 2.0000 mg | ORAL_TABLET | Freq: Three times a day (TID) | ORAL | Status: DC | PRN

## 2021-04-16 MED ORDER — HYDROXYZINE HCL 25 MG PO TABS: 25.0000 mg | ORAL_TABLET | Freq: Three times a day (TID) | ORAL | Status: DC | PRN

## 2021-04-16 MED ORDER — RISPERIDONE 1 MG PO TABS
1.0000 mg | ORAL_TABLET | Freq: Once | ORAL | Status: AC
  Administered 2021-04-16: 1 mg via ORAL
  Filled 2021-04-16: qty 1

## 2021-04-16 MED ORDER — BENZTROPINE MESYLATE 0.5 MG PO TABS: 0.5000 mg | ORAL_TABLET | Freq: Two times a day (BID) | ORAL | Status: DC | PRN

## 2021-04-16 MED ORDER — GABAPENTIN 100 MG PO CAPS
100.0000 mg | ORAL_CAPSULE | Freq: Three times a day (TID) | ORAL | Status: DC
  Administered 2021-04-16 – 2021-04-20 (×12): 100 mg via ORAL
  Filled 2021-04-16 (×18): qty 1

## 2021-04-16 MED ORDER — RISPERIDONE 1 MG PO TABS
1.0000 mg | ORAL_TABLET | Freq: Every day | ORAL | Status: DC
  Administered 2021-04-16: 1 mg via ORAL
  Filled 2021-04-16 (×3): qty 1

## 2021-04-16 NOTE — BHH Group Notes (Signed)
Adult Psychoeducational Group Note  Date:  04/16/2021 Time:  10:40 AM  Group Topic/Focus:  Goals Group:   The focus of this group is to help patients establish daily goals to achieve during treatment and discuss how the patient can incorporate goal setting into their daily lives to aide in recovery.  Participation Level:  Did Not Attend    Dub Mikes 04/16/2021, 10:40 AM

## 2021-04-16 NOTE — Group Note (Signed)
Recreation Therapy Group Note   Group Topic:Health and Wellness  Group Date: 04/16/2021 Start Time: 1000 End Time: 1035 Facilitators: Victorino Sparrow, LRT,CTRS Location: [address] Dayroom   Goal Area(s) Addresses:  Patient will verbalize benefit of exercise during group session. Patient will verbalize an exercise that can be completed in their hospital room during admission. Patient will identify an exercise that can be completed post d/c.  Group Description:  Exercise.  LRT and patients discussed the importance of exercise and benefits.  LRT then led group in a series of stretches to loosen up.  Each patient will take turns leading the group in some sort of movement of their choice.  The exercise will last at least 30 minutes.  Patients were encouraged to take breaks and get water as needed.   Affect/Mood: N/A   Participation Level: Did not attend    Clinical Observations/Individualized Feedback:      Plan: Continue to engage patient in RT group sessions 2-3x/week.   Victorino Sparrow, LRT,CTRS  04/16/2021 11:05 AM

## 2021-04-16 NOTE — BHH Counselor (Signed)
ACTT team contacted and they agreed to come to do an assessment next week.  CSW will follow up about scheduling an appointment to get connected to services.    Itzae Mccurdy, LCSW, [place] Social Worker  [facility]
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
## 2021-04-16 NOTE — Progress Notes (Signed)
°   04/16/21 1000  Psych Admission Type (Psych Patients Only)  Admission Status Involuntary  Psychosocial Assessment  Patient Complaints Substance abuse  Eye Contact Brief  Facial Expression Sad;Anxious  Affect Anxious;Depressed;Sad  Speech Logical/coherent;Soft  Interaction Assertive  Motor Activity Slow  Appearance/Hygiene In scrubs  Behavior Characteristics Cooperative  Mood Depressed  Aggressive Behavior  Effect No apparent injury  Thought Process  Coherency WDL  Content Paranoia  Delusions None reported or observed  Perception Hallucinations  Hallucination Auditory;Visual  Judgment Impaired  Confusion None  Danger to Self  Current suicidal ideation? Denies  Danger to Others  Danger to Others None reported or observed
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
## 2021-04-16 NOTE — Progress Notes (Signed)
Pt apprehensive about starting Risperdal, writer talked to pt and pt appeared to feel better    04/16/21 2100  Psych Admission Type (Psych Patients Only)  Admission Status Involuntary  Psychosocial Assessment  Patient Complaints Substance abuse;Suspiciousness  Eye Contact Brief  Facial Expression Sad;Anxious  Affect Anxious;Depressed;Sad  Speech Logical/coherent;Soft  Interaction Assertive  Motor Activity Slow  Appearance/Hygiene In scrubs  Behavior Characteristics Cooperative  Mood Suspicious;Depressed;Preoccupied  Aggressive Behavior  Effect No apparent injury  Thought Process  Coherency WDL  Content Paranoia  Delusions None reported or observed  Perception Hallucinations  Hallucination Auditory;Visual  Judgment Impaired  Confusion None  Danger to Self  Current suicidal ideation? Denies  Danger to Others  Danger to Others None reported or observed

## 2021-04-17 LAB — GLUCOSE, CAPILLARY: Glucose-Capillary: 134 mg/dL — ABNORMAL HIGH (ref 70–99)

## 2021-04-17 MED ORDER — RISPERIDONE 1 MG PO TABS
1.0000 mg | ORAL_TABLET | Freq: Two times a day (BID) | ORAL | Status: DC
  Administered 2021-04-17 – 2021-04-22 (×10): 1 mg via ORAL
  Filled 2021-04-17 (×14): qty 1

## 2021-04-17 MED ORDER — RISPERIDONE 1 MG PO TABS
1.0000 mg | ORAL_TABLET | Freq: Once | ORAL | Status: AC
  Administered 2021-04-17: 1 mg via ORAL
  Filled 2021-04-17: qty 1

## 2021-04-17 MED ORDER — NITROFURANTOIN MONOHYD MACRO 100 MG PO CAPS
100.0000 mg | ORAL_CAPSULE | Freq: Two times a day (BID) | ORAL | Status: DC
  Administered 2021-04-17 – 2021-04-22 (×10): 100 mg via ORAL
  Filled 2021-04-17 (×17): qty 1
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
## 2021-04-17 NOTE — Group Note (Signed)
Recreation Therapy Group Note   Group Topic:Other  Group Date: 04/17/2021 Start Time: 1010 End Time: 1040 Facilitators: Victorino Sparrow, LRT,CTRS Location: [address] Dayroom   Goal Area(s) Addresses:  Patient will appropriately identify why self expression is important. Patient will create a shield of armor describing what makes them who they are. Patient will successfully identify positive attributes about themselves.    Group Description: Self-Expression Shield. Patient attended a recreation therapy group session focused on self expression.  LRT gave each patient a blank drawing of a shield.  Patients was asked to design the shield in a way to show off what is important to them.  The  four quadrants reflected the following:   The Upper Left quadrant- two things or people they value. The Upper Right quadrant- two lessons learned thus far in life. The Lower Left quadrant- three characteristics that make you unique. The Lower Right quadrant- what is a goal you are working towards.   Patients were provided sheets with the shield printed on them and colored pencils, markers and crayons to complete the activity.  Patients and writer had group related discussions while individually working on their activity.  Patients were debriefed on the importance of healthy self expression     Affect/Mood: Depressed   Participation Level: Active   Participation Quality: Independent   Behavior: Appropriate   Speech/Thought Process: Barely audible  and Focused   Insight: Good   Judgement: Good   Modes of Intervention: Art   Patient Response to Interventions:  Attentive   Education Outcome:  Acknowledges education and In group clarification offered    Clinical Observations/Individualized Feedback: Pt came into group near the end.  Pt didn't get to complete a shield but did answer the questions.  Pt stated she values herself and God because she should.  Two lessons pt identified were "how  to be a good mother and how I'm going to get through after losing my son".  Pt identified the things that make her different as being different like the way she dresses, has a lot of quotes on social media page and is outspoken.  Lastly the goal pt was accomplish is "not relapsing after 5 years".    Plan: Continue to engage patient in RT group sessions 2-3x/week.   Victorino Sparrow, Glennis Brink 04/17/2021 12:46 PM

## 2021-04-17 NOTE — Progress Notes (Signed)
°   04/17/21 0545  Sleep  Number of Hours 7.75

## 2021-04-17 NOTE — Progress Notes (Addendum)
Pt visible in the dayroom much of the evening, pt stated she was feeling better AVH better        04/17/21 2000  Psych Admission Type (Psych Patients Only)  Admission Status Involuntary  Psychosocial Assessment  Patient Complaints Suspiciousness;Substance abuse  Eye Contact Brief  Facial Expression Sad;Anxious  Affect Anxious;Depressed;Sad  Speech Logical/coherent;Soft  Interaction Assertive  Motor Activity Slow  Appearance/Hygiene In scrubs  Behavior Characteristics Cooperative  Mood Depressed;Pleasant  Aggressive Behavior  Effect No apparent injury  Thought Process  Coherency WDL  Content Paranoia  Delusions None reported or observed  Perception Hallucinations  Hallucination Auditory;Visual  Judgment Impaired  Confusion None  Danger to Self  Current suicidal ideation? Denies  Danger to Others  Danger to Others None reported or observed

## 2021-04-17 NOTE — Group Note (Signed)
LCSW Group Therapy Note  Group Date: 04/17/2021 Start Time: 1100 End Time: 1200   Type of Therapy and Topic:  Group Therapy: Positive Affirmations  Participation Level:  Active   Description of Group:   This group addressed positive affirmation towards self and others.  Patients went around the room and identified two positive things about themselves and two positive things about a peer in the room.  Patients reflected on how it felt to share something positive with others, to identify positive things about themselves, and to hear positive things from others/ Patients were encouraged to have a daily reflection of positive characteristics or circumstances.   Therapeutic Goals: Patients will verbalize two of their positive qualities Patients will demonstrate empathy for others by stating two positive qualities about a peer in the group Patients will verbalize their feelings when voicing positive self affirmations and when voicing positive affirmations of others Patients will discuss the potential positive impact on their wellness/recovery of focusing on positive traits of self and others.  Summary of Patient Progress:  Patient was able to share she was proud of herself.  Therapeutic Modalities:   Cognitive Behavioral Therapy Motivational Interviewing    Vassie Moselle, LCSW 04/17/2021  11:29 AM

## 2021-04-17 NOTE — Progress Notes (Signed)
DAR NOTE: Patient presents with anxious affect and depressed mood.  Reports hearing voices, but not telling her to hurt self or others.  Rates depression at 7.5, hopelessness at 7, and anxiety at 7.5.  Maintained on routine safety checks.  Medications given as prescribed.  Support and encouragement offered as needed.  Attended group and participated.  States goal for today is "to remain calm."  Patient visible in milieu with minimal interaction.  Patient is safe on and off the unit.

## 2021-04-17 NOTE — Progress Notes (Signed)
Adult Psychoeducational Group Note  Date:  04/17/2021 Time:  8:25 PM  Group Topic/Focus:  Wrap-Up Group:   The focus of this group is to help patients review their daily goal of treatment and discuss progress on daily workbooks.  Participation Level:  Active  Participation Quality:  Appropriate  Affect:  Appropriate  Cognitive:  Appropriate  Insight: Appropriate  Engagement in Group:  Engaged  Modes of Intervention:  Discussion  Additional Comments:  Pt stated her goal for today was to focus on her treatment plan. Pt stated she accomplished her goal today. Pt stated she talk with a doctor and her social worker about her care today. Pt rated her overall day a 7 out of 10. Pt stated she was able to contact her son today which improved her overall day. Pt stated she felt better about herself tonight. Pt stated she was able to attend all meals today. Pt stated she was able to attend all groups held today. Pt stated she took all medications provided today. Pt stated her appetite was pretty good today. Pt rated her sleep last night was good. Pt stated the goal tonight was to get some rest. Pt stated she had no physical pain tonight. Pt deny visual hallucinations and auditory issues tonight. Pt denies thoughts of harming herself or others. Pt stated she would alert staff if anything changed.  Candy Sledge 04/17/2021, 8:25 PM

## 2021-04-17 NOTE — [facility] Group Notes (Signed)
[facility] Group Notes:  (Nursing/MHT/Case Management/Adjunct)  Date:  04/17/2021  Time:  9:52 AM  Type of Therapy:   Orientation/Goals group  Participation Level:  Minimal  Participation Quality:  Appropriate  Affect:  Appropriate  Cognitive:  Appropriate  Insight:  Improving  Engagement in Group:  Engaged and Improving  Modes of Intervention:  Discussion, Education, and Orientation  Summary of Progress/Problems: Pt goal for today is to remain calm and focus on herself.   Joanne Gonzalez J Joanne Gonzalez 04/17/2021, 9:52 AM

## 2021-04-18 LAB — URINE CULTURE: Culture: 40000 — AB

## 2021-04-18 LAB — GLUCOSE, CAPILLARY: Glucose-Capillary: 130 mg/dL — ABNORMAL HIGH (ref 70–99)

## 2021-04-18 MED ORDER — CLONIDINE HCL 0.1 MG PO TABS: 0.1000 mg | ORAL_TABLET | Freq: Four times a day (QID) | ORAL | Status: DC | PRN

## 2021-04-18 MED ORDER — WHITE PETROLATUM EX OINT
TOPICAL_OINTMENT | CUTANEOUS | Status: AC
  Administered 2021-04-18: 1
  Filled 2021-04-18: qty 5

## 2021-04-18 MED ORDER — CLONIDINE HCL 0.1 MG PO TABS
0.1000 mg | ORAL_TABLET | Freq: Once | ORAL | Status: AC
  Administered 2021-04-18: 0.1 mg via ORAL
  Filled 2021-04-18 (×2): qty 1

## 2021-04-18 NOTE — Group Note (Signed)
°  [facility] LCSW Group Therapy Note  Date/Time:  04/18/2021 11:15AM-12:00PM  Type of Therapy and Topic:  Group Therapy:  Feelings About Hospitalization  Participation Level:  Minimal   Description of Group This process group involved patients discussing their feelings related to being hospitalized, as well as the benefits they see to being in the hospital.  These feelings and benefits were itemized.  The group then brainstormed specific ways in which they could seek those same benefits when they discharge and return home.  Therapeutic Goals Patient will identify and describe positive and negative feelings related to hospitalization Patient will verbalize benefits of hospitalization to themselves personally Patients will brainstorm together ways they can obtain similar benefits in the outpatient setting, identify barriers to wellness and possible solutions  Summary of Patient Progress:  The patient expressed her primary feelings about being hospitalized are "embarrassed" even though she feels she needs to be in the hospital.  She was able to acknowledge that if she was in the hospital for a physical illness it would not be embarrassing, but this is with her mind and therefore it is, to her, different.  When asked if her mind/brain is part of her physical body, she was able to agree but did not understand the connection.  Another patient was extremely intrusive and counterproductive to group leader making progress in explaining the connection.  Eventually the patient left the room and did not return.  Therapeutic Modalities Cognitive Behavioral Therapy Motivational Interviewing    Selmer Dominion, LCSW 04/18/2021, 11:50 AM

## 2021-04-18 NOTE — Progress Notes (Signed)
Pt was encouraged but didn't attend afternoon psycho-ed group.

## 2021-04-18 NOTE — Progress Notes (Signed)
Pt was encouraged but didn't attend orientation/goals group.

## 2021-04-18 NOTE — Progress Notes (Signed)
°   04/18/21 2101  Psych Admission Type (Psych Patients Only)  Admission Status Involuntary  Psychosocial Assessment  Patient Complaints Anxiety;Depression;Worrying  Eye Contact Brief  Facial Expression Anxious;Sullen;Sad  Affect Appropriate to circumstance;Depressed  Speech Logical/coherent;Soft  Interaction Assertive  Motor Activity Slow  Appearance/Hygiene Unremarkable  Behavior Characteristics Cooperative;Appropriate to situation  Mood Anxious;Depressed  Thought Process  Coherency WDL  Content Paranoia  Delusions None reported or observed  Perception Hallucinations  Hallucination Auditory  Judgment Impaired  Confusion None  Danger to Self  Current suicidal ideation? Denies  Danger to Others  Danger to Others None reported or observed

## 2021-04-18 NOTE — Progress Notes (Signed)
°   04/18/21 0530  Sleep  Number of Hours 6
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
## 2021-04-18 NOTE — [facility] Group Notes (Signed)
Goals Group 04/18/21    Group Focus: affirmation, clarity of thought, and goals/reality orientation Treatment Modality:  Psychoeducation Interventions utilized were assignment, group exercise, and support Purpose: To be able to understand and verbalize the reason for their admission to the hospital. To understand that the medication helps with their chemical imbalance but they also need to work on their choices in life. To be challenged to develop a list of 30 positives about themselves. Also introduce the concept that "feelings" are not reality.  Participation Level:  did not attend Paulino Rily

## 2021-04-18 NOTE — Progress Notes (Signed)
Adult Psychoeducational Group Note  Date:  04/18/2021 Time:  8:56 PM  Group Topic/Focus:  Wrap-Up Group:   The focus of this group is to help patients review their daily goal of treatment and discuss progress on daily workbooks.  Participation Level:  Active  Participation Quality:  Appropriate  Affect:  Appropriate  Cognitive:  Appropriate  Insight: Appropriate  Engagement in Group:  Engaged  Modes of Intervention:  Discussion  Additional Comments:  Pt stated her goal for today was to focus on her treatment plan. Pt stated she accomplished her goal today. Pt stated she was not sure if she talk with a doctor or her social worker about her care today. Pt rated her overall day a 8 out of 10. Pt stated she was able to contact her boyfriend today which improved her overall day. Pt stated she felt better about herself tonight. Pt stated she was able to attend all meals today. Pt stated she was able to attend all groups held today. Pt stated she took all medications provided today. Pt stated her appetite was pretty good today. Pt rated her sleep last night was good. Pt stated the goal tonight was to get some rest. Pt stated she had no physical pain tonight. Pt deny visual hallucinations and auditory issues tonight. Pt denies thoughts of harming herself or others. Pt stated she would alert staff if anything changed.  Candy Sledge 04/18/2021, 8:56 PM

## 2021-04-19 LAB — GLUCOSE, CAPILLARY: Glucose-Capillary: 145 mg/dL — ABNORMAL HIGH (ref 70–99)

## 2021-04-19 MED ORDER — MIRTAZAPINE 30 MG PO TABS
30.0000 mg | ORAL_TABLET | Freq: Every day | ORAL | Status: DC
  Administered 2021-04-19 – 2021-04-21 (×3): 30 mg via ORAL
  Filled 2021-04-19 (×6): qty 1

## 2021-04-19 NOTE — Progress Notes (Signed)
Chaplain met with Br[place] and provided listening support as she shared about her son who was visiting her from [place] when he was murdered.  She also shared about another son who is in prison currently.  He is getting out very soon and she is trying to focus on that.  She did not wish to continue to talk about her sons, as she was trying to bring herself out of feeling so much grief for the time.  [place], [place] Pager, [phone] 9:18 PM

## 2021-04-19 NOTE — Progress Notes (Signed)
Pt was encouraged but didn't attend orientation/goals group.

## 2021-04-19 NOTE — Progress Notes (Addendum)
D: Patient continues to express some abdominal pain; however, she is up and visible in the milieu. She is compliant with her medications and is pleasant with staff. She states her nausea has improved since yesterday. She denies any thoughts of self harm; she denies any delusional thoughts. Patient appears guarded and anxious.  A: Continue to monitor medication management and MD orders.  Safety checks completed every 15 minutes per protocol.  Offer support and encouragement as needed.  R: Patient is receptive to staff; her behavior is appropriate.       04/19/21 0800  Psych Admission Type (Psych Patients Only)  Admission Status Involuntary  Psychosocial Assessment  Patient Complaints Anxiety  Eye Contact Brief  Facial Expression Sullen;Sad  Affect Appropriate to circumstance  Speech Soft  Interaction Assertive  Motor Activity Slow  Appearance/Hygiene Unremarkable  Behavior Characteristics Cooperative  Mood Anxious;Depressed  Thought Process  Coherency WDL  Content Paranoia  Delusions None reported or observed  Perception Hallucinations  Hallucination Auditory  Judgment Impaired  Confusion None  Danger to Self  Current suicidal ideation? Denies  Danger to Others  Danger to Others None reported or observed
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
## 2021-04-19 NOTE — Group Note (Signed)
[place] LCSW Group Therapy Note  Date/Time:  04/19/2021  11:00AM-12:00PM  Type of Therapy and Topic:  Group Therapy:  Music and Mood  Participation Level:  Did Not Attend   Description of Group: In this process group, members listened to a variety of genres of music and identified that different types of music evoke different responses.  Patients were encouraged to identify music that was soothing for them and music that was energizing for them.  Patients discussed how this knowledge can help with wellness and recovery in various ways including managing depression and anxiety as well as encouraging healthy sleep habits.    Therapeutic Goals: Patients will explore the impact of different varieties of music on mood Patients will verbalize the thoughts they have when listening to different types of music Patients will identify music that is soothing to them as well as music that is energizing to them Patients will discuss how to use this knowledge to assist in maintaining wellness and recovery Patients will explore the use of music as a coping skill  Summary of Patient Progress:  The patient was invited but did not attend group.  Therapeutic Modalities: Solution Focused Brief Therapy Activity   Selmer Dominion, LCSW
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
## 2021-04-19 NOTE — Progress Notes (Signed)
D:  Joanne Gonzalez was up and visible on the unit.  She was pleasant and cooperative.  She reported her day was "good and bad."  She is worried about her other son because her family didn't tell him about the death of his brother because he was just released from prison two days ago.  "He knows now and I am worried that he is going to do something stupid."  She reported other family members are on their way to talk to him and she doesn't know the outcome at this time.  She denied SI or AVH.  She did state that she wanted to kill her son because "I don't know what to do" but was unable to explain further just stating "no I don't want that but I still don't know what to do."  She denied actively wanting to harm anyone.  She took her hs medications without difficulty.  She questioned taking trazodone but was encouraged to wait to take that later if the increase in Remeron didn't help.  She did come back up 2.5 hours later and requested the prn trazodone.   A:  1:1 with RN for support and encouragement.  Medications given as ordered.  Q 15 minute checks maintained for safety.  Encouraged participation in group and unit activities.   R:  She is currently resting with her eyes closed and appears to be asleep.  She remains safe on the unit.  We will continue to monitor the progress towards her goals.

## 2021-04-20 ENCOUNTER — Encounter (HOSPITAL_COMMUNITY): Payer: Self-pay

## 2021-04-20 LAB — GLUCOSE, CAPILLARY: Glucose-Capillary: 135 mg/dL — ABNORMAL HIGH (ref 70–99)

## 2021-04-20 MED ORDER — MELATONIN 3 MG PO TABS
3.0000 mg | ORAL_TABLET | Freq: Every evening | ORAL | Status: DC | PRN
  Filled 2021-04-20: qty 1

## 2021-04-20 MED ORDER — GABAPENTIN 100 MG PO CAPS
200.0000 mg | ORAL_CAPSULE | Freq: Three times a day (TID) | ORAL | Status: DC
  Administered 2021-04-20 – 2021-04-22 (×7): 200 mg via ORAL
  Filled 2021-04-20 (×12): qty 2

## 2021-04-20 NOTE — BHH Counselor (Addendum)
CSW called [facility] to set up an intake appointment prior to patient being discharge.  CSW left a message for a call back to set up the appointment.   Addendum: Team Lead called back and scheduled intake for ACTT services on Wednesday, 04/22/2021 at [place], LCSW, [place] Worker  [facility]

## 2021-04-20 NOTE — Group Note (Signed)
LCSW Group Therapy Note   Group Date: 04/20/2021 Start Time: 1300 End Time: 1400    Type of Therapy:  Group Therapy: Value Clarification      Participation Level: Did not attend   Description of Group:  In this group, patients will learn to explore, identify, and clarify the values that influence their decisions and behavior and encourages them to build on their inner resources and strengths. This group will be clarifying, exporational and eductional, with patients participating in identifying their own values, identifying others values, and identifying societal values and their impact on their lives.      Therapeutic Goals: Patient will learn to identify values. Patient will learn to identify others values. Patient will learn ways to use their values in decisions they make in their daily lives. Patient will receive support and feedback from others     Therapeutic Modalities:  Acceptance and Commitment Therapy      Summary of Progress/Problems: did not attend    Darletta Moll MSW, [place]

## 2021-04-20 NOTE — BHH Group Notes (Signed)
Adult Psychoeducational Group Note  Date:  04/20/2021 Time:  9:01 AM  Group Topic/Focus:  Goals Group:   The focus of this group is to help patients establish daily goals to achieve during treatment and discuss how the patient can incorporate goal setting into their daily lives to aide in recovery.  Participation Level:  Did Not Attend    Dub Mikes 04/20/2021, 9:01 AM

## 2021-04-20 NOTE — BHH Group Notes (Addendum)
Wrap up group. Joanne Gonzalez reported her day was up and down.  She was able to speak with family which made it better.  She continued to state she was just confused and wasn't sure what to do.

## 2021-04-20 NOTE — Progress Notes (Signed)
°   04/20/21 1000  Psych Admission Type (Psych Patients Only)  Admission Status Involuntary  Psychosocial Assessment  Patient Complaints Worrying  Eye Contact Fair  Facial Expression Anxious;Sad  Affect Appropriate to circumstance  Speech Soft;Logical/coherent  Interaction Assertive  Motor Activity Slow  Appearance/Hygiene Unremarkable  Behavior Characteristics Cooperative  Mood Depressed  Thought Process  Coherency WDL  Content Paranoia  Delusions None reported or observed  Perception Hallucinations  Hallucination Auditory  Judgment Impaired  Confusion None  Danger to Self  Current suicidal ideation? Denies  Danger to Others  Danger to Others None reported or observed

## 2021-04-20 NOTE — BH IP Treatment Plan (Signed)
Interdisciplinary Treatment and Diagnostic Plan Update  04/20/2021 Time of Session: 9:55am Joanne Gonzalez MRN: [id number]  Principal Diagnosis: Schizophrenia, unspecified ([place])  Secondary Diagnoses: Principal Problem:   Schizophrenia, unspecified ([place]) Active Problems:   Cocaine abuse ([place])   Current Medications:  Current Facility-Administered Medications  Medication Dose Route Frequency Provider Last Rate Last Admin   acetaminophen (TYLENOL) tablet 650 mg  650 mg Oral Q6H PRN Rankin, Shuvon B, NP       alum & mag hydroxide-simeth (MAALOX/MYLANTA) 200-200-20 MG/5ML suspension 30 mL  30 mL Oral Q4H PRN Rankin, Shuvon B, NP       benztropine (COGENTIN) tablet 0.5 mg  0.5 mg Oral BID PRN Nelda Marseille, Amy E, MD       cloNIDine (CATAPRES) tablet 0.1 mg  0.1 mg Oral Q6H PRN Nicholes Rough, NP       gabapentin (NEURONTIN) capsule 100 mg  100 mg Oral TID Damita Dunnings B, MD   100 mg at 04/20/21 0836   hydrOXYzine (ATARAX) tablet 25 mg  25 mg Oral TID PRN Harlow Asa, MD       risperiDONE (RISPERDAL M-TABS) disintegrating tablet 2 mg  2 mg Oral Q8H PRN Nelda Marseille, Amy E, MD       And   LORazepam (ATIVAN) tablet 1 mg  1 mg Oral PRN Harlow Asa, MD       And   ziprasidone (GEODON) injection 20 mg  20 mg Intramuscular PRN Harlow Asa, MD       metFORMIN (GLUCOPHAGE) tablet 500 mg  500 mg Oral Q breakfast Rankin, Shuvon B, NP   500 mg at 04/20/21 0836   mirtazapine (REMERON) tablet 30 mg  30 mg Oral QHS Nkwenti, Tamela Oddi, NP   30 mg at 04/19/21 2109   nicotine (NICODERM CQ - dosed in mg/24 hr) patch 7 mg  7 mg Transdermal Daily PRN Harlow Asa, MD       nitrofurantoin (macrocrystal-monohydrate) (MACROBID) capsule 100 mg  100 mg Oral Q12H Nelda Marseille, Amy E, MD   100 mg at 04/20/21 0836   ondansetron (ZOFRAN) tablet 4 mg  4 mg Oral Q8H PRN Harlow Asa, MD   4 mg at 04/18/21 0634   pantoprazole (PROTONIX) EC tablet 40 mg  40 mg Oral Daily Rankin, Shuvon B, NP   40 mg at 04/15/21 0944    risperiDONE (RISPERDAL) tablet 1 mg  1 mg Oral BID Damita Dunnings B, MD   1 mg at 04/20/21 0836   traZODone (DESYREL) tablet 150 mg  150 mg Oral QHS PRN Rankin, Shuvon B, NP   150 mg at 04/19/21 2349   PTA Medications: Medications Prior to Admission  Medication Sig Dispense Refill Last Dose   metFORMIN (GLUCOPHAGE) 500 MG tablet Take 1 tablet (500 mg total) by mouth daily with breakfast. 30 tablet 1    omeprazole (PRILOSEC) 20 MG capsule Take 1 capsule (20 mg total) by mouth daily. (Patient taking differently: Take 20 mg by mouth daily as needed (for heartburn or reflux).) 30 capsule 3    ondansetron (ZOFRAN) 4 MG tablet Take 1 tablet (4 mg total) by mouth every 8 (eight) hours as needed for nausea or vomiting. (Patient not taking: Reported on 04/12/2021) 20 tablet 0    QUEtiapine 150 MG TABS Take 150 mg by mouth at bedtime. 30 tablet 1    traZODone (DESYREL) 150 MG tablet Take 1 tablet (150 mg total) by mouth at bedtime as needed for sleep. 30 tablet 1  Vitamin D, Ergocalciferol, (DRISDOL) 1.25 MG (50000 UNIT) CAPS capsule Take 1 capsule (50,000 Units total) by mouth every 7 (seven) days. (Patient taking differently: Take 50,000 Units by mouth every Tuesday.) 4 capsule 2     Patient Stressors: Substance abuse   Other: unsure if she can return to [facility]    Patient Strengths: Supportive family/friends   Treatment Modalities: Medication Management, Group therapy, Case management,  1 to 1 session with clinician, Psychoeducation, Recreational therapy.   Physician Treatment Plan for Primary Diagnosis: Schizophrenia, unspecified ([place]) Long Term Goal(s): Improvement in symptoms so as ready for discharge   Short Term Goals: Ability to identify changes in lifestyle to reduce recurrence of condition will improve Ability to verbalize feelings will improve Ability to disclose and discuss suicidal ideas Ability to demonstrate self-control will improve Ability to identify and develop  effective coping behaviors will improve Ability to maintain clinical measurements within normal limits will improve Compliance with prescribed medications will improve Ability to identify triggers associated with substance abuse/mental health issues will improve  Medication Management: Evaluate patient's response, side effects, and tolerance of medication regimen.  Therapeutic Interventions: 1 to 1 sessions, Unit Group sessions and Medication administration.  Evaluation of Outcomes: Progressing  Physician Treatment Plan for Secondary Diagnosis: Principal Problem:   Schizophrenia, unspecified ([place]) Active Problems:   Cocaine abuse ([place])  Long Term Goal(s): Improvement in symptoms so as ready for discharge   Short Term Goals: Ability to identify changes in lifestyle to reduce recurrence of condition will improve Ability to verbalize feelings will improve Ability to disclose and discuss suicidal ideas Ability to demonstrate self-control will improve Ability to identify and develop effective coping behaviors will improve Ability to maintain clinical measurements within normal limits will improve Compliance with prescribed medications will improve Ability to identify triggers associated with substance abuse/mental health issues will improve     Medication Management: Evaluate patient's response, side effects, and tolerance of medication regimen.  Therapeutic Interventions: 1 to 1 sessions, Unit Group sessions and Medication administration.  Evaluation of Outcomes: Progressing   RN Treatment Plan for Primary Diagnosis: Schizophrenia, unspecified ([place]) Long Term Goal(s): Knowledge of disease and therapeutic regimen to maintain health will improve  Short Term Goals: Ability to remain free from injury will improve, Ability to verbalize frustration and anger appropriately will improve, Ability to demonstrate self-control, Ability to identify and develop effective coping behaviors will  improve, and Compliance with prescribed medications will improve  Medication Management: RN will administer medications as ordered by provider, will assess and evaluate patient's response and provide education to patient for prescribed medication. RN will report any adverse and/or side effects to prescribing provider.  Therapeutic Interventions: 1 on 1 counseling sessions, Psychoeducation, Medication administration, Evaluate responses to treatment, Monitor vital signs and CBGs as ordered, Perform/monitor CIWA, COWS, AIMS and Fall Risk screenings as ordered, Perform wound care treatments as ordered.  Evaluation of Outcomes: Progressing   LCSW Treatment Plan for Primary Diagnosis: Schizophrenia, unspecified ([place]) Long Term Goal(s): Safe transition to appropriate next level of care at discharge, Engage patient in therapeutic group addressing interpersonal concerns.  Short Term Goals: Engage patient in aftercare planning with referrals and resources, Increase social support, Increase ability to appropriately verbalize feelings, Increase emotional regulation, Identify triggers associated with mental health/substance abuse issues, and Increase skills for wellness and recovery  Therapeutic Interventions: Assess for all discharge needs, 1 to 1 time with Social worker, Explore available resources and support systems, Assess for adequacy in community support network, Educate  family and significant other(s) on suicide prevention, Complete Psychosocial Assessment, Interpersonal group therapy.  Evaluation of Outcomes: Progressing   Progress in Treatment: Attending groups: Yes. Participating in groups: Yes. Taking medication as prescribed: Yes. Toleration medication: Yes. Family/Significant other contact made: Yes, individual(s) contacted:  Roommate  Patient understands diagnosis: No. Discussing patient identified problems/goals with staff: Yes. Medical problems stabilized or resolved: Yes. Denies  suicidal/homicidal ideation: Yes. Issues/concerns per patient self-inventory: No.     New problem(s) identified: No, Describe:  None    New Short Term/Long Term Goal(s): medication stabilization, elimination of SI thoughts, development of comprehensive mental wellness plan.    Patient Goals: "To clear my head of negative thoughts"    Discharge Plan or Barriers: Patient is to return to [facility] and is being referred for ACTT services.   Reason for Continuation of Hospitalization: Depression Hallucinations Medication stabilization    Estimated Length of Stay: 3 to 5 days    Scribe for Treatment Team: Vassie Moselle, LCSW 04/20/2021 11:16 AM

## 2021-04-20 NOTE — Group Note (Signed)
Recreation Therapy Group Note   Group Topic:Coping Skills  Group Date: 04/20/2021 Start Time: 1000 End Time: 3785 Facilitators: Victorino Sparrow, LRT,CTRS Location: [address] Dayroom   Goal Area(s) Addresses: Patient will define what a coping skill is. Patient will work to create a list of healthy coping skills beginning with each letter of the alphabet. Patient will successfully identify positive coping skills they can use post d/c.  Patient will acknowledge benefit(s) of using learned coping skills post d/c.   Group Description: Coping A to Z. Patient asked to identify what a coping skill is and when they use them. Patients with Probation officer discussed healthy versus unhealthy coping skills. Next patients were given a blank worksheet titled "Coping Skills A-Z". Patients were instructed to come up with at least one positive coping skill per letter of the alphabet, addressing a specific challenge (ex: stress, anger, anxiety, depression, grief, doubt, isolation, self-harm/suicidal thoughts, substance use). Patients were given 15 minutes to brainstorm with, before ideas were presented to the large group. Patients and LRT debriefed on the importance of coping skill selection based on situation and back-up plans when a skill tried is not effective. At the end of group, patients were given an handout of alphabetized strategies to keep for future reference.   Affect/Mood: Appropriate   Participation Level: Engaged   Participation Quality: Independent   Behavior: Appropriate   Speech/Thought Process: Focused   Insight: Good   Judgement: Good   Modes of Intervention: Worksheet   Patient Response to Interventions:  Engaged   Education Outcome:  Acknowledges education and In group clarification offered    Clinical Observations/Individualized Feedback: Pt arrived near the group.  Pt was given the challenge of dealing with the loss of a loved one.  Pt came up some coping skills like acknowledge,  behavior, cleaning, forgive, grateful, healthy, inspire, justice, no nonsense, open minded, perfection, useful and victorious.  Pt expressed not knowing what coping skills she would try that she hadn't.  Pt did however, identify some of the coping skills she uses from her list as grateful, forgive, love, useful, tender, cleaning and justice.    Plan: Continue to engage patient in RT group sessions 2-3x/week.   Victorino Sparrow, LRT,CTRS  04/20/2021 12:16 PM
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
## 2021-04-20 NOTE — Progress Notes (Signed)
Adult Psychoeducational Group Note  Date:  04/20/2021 Time:  11:12 PM  Group Topic/Focus:  Wrap-Up Group:   The focus of this group is to help patients review their daily goal of treatment and discuss progress on daily workbooks.  Participation Level:  Active  Participation Quality:  Appropriate  Affect:  Appropriate  Cognitive:  Appropriate  Insight: Appropriate  Engagement in Group:  Engaged  Modes of Intervention:  Discussion  Additional Comments:  Pt stated her goal for today was to focus on her treatment plan. Pt stated she accomplished her goal today. Pt stated she talk with  her doctor and with her social worker about her care today. Pt rated her overall day a 9 out of 10. Pt stated she was able to contact her father and her son  today which improved her overall day. Pt stated she felt better about herself tonight. Pt stated she was able to attend all meals today. Pt stated she was able to attend all groups held today. Pt stated she took all medications provided today. Pt stated her appetite was pretty good today. Pt rated her sleep last night was poor. Pt stated the goal tonight was to get some rest. Pt stated she had no physical pain tonight. Pt deny visual hallucinations and auditory issues tonight. Pt denies thoughts of harming herself or others. Pt stated she would alert staff if anything changed  Candy Sledge 04/20/2021, 11:12 PM

## 2021-04-21 DIAGNOSIS — F29 Unspecified psychosis not due to a substance or known physiological condition: Secondary | ICD-10-CM

## 2021-04-21 LAB — GLUCOSE, CAPILLARY: Glucose-Capillary: 144 mg/dL — ABNORMAL HIGH (ref 70–99)
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
## 2021-04-21 NOTE — Progress Notes (Signed)
°   04/21/21 1100  Psych Admission Type (Psych Patients Only)  Admission Status Involuntary  Psychosocial Assessment  Patient Complaints Anxiety  Eye Contact Fair  Facial Expression Anxious;Sad  Affect Appropriate to circumstance  Speech Soft;Logical/coherent  Interaction Assertive  Motor Activity Other (Comment) (unremarkable)  Appearance/Hygiene Unremarkable  Behavior Characteristics Cooperative  Mood Anxious  Thought Process  Coherency WDL  Content Paranoia  Delusions None reported or observed  Perception WDL  Hallucination None reported or observed  Judgment Impaired  Confusion None  Danger to Self  Current suicidal ideation? Denies  Danger to Others  Danger to Others None reported or observed

## 2021-04-21 NOTE — Progress Notes (Signed)
Pt was encouraged but didn't attend orientation/goals group.

## 2021-04-21 NOTE — Progress Notes (Signed)
°   04/21/21 2153  Psych Admission Type (Psych Patients Only)  Admission Status Involuntary  Psychosocial Assessment  Patient Complaints None  Eye Contact Fair  Facial Expression Anxious;Sad  Affect Appropriate to circumstance  Speech Soft;Logical/coherent  Interaction Assertive  Motor Activity Other (Comment) (unremarkable)  Appearance/Hygiene Unremarkable  Behavior Characteristics Cooperative  Mood Pleasant  Thought Process  Coherency WDL  Content Paranoia  Delusions None reported or observed  Perception WDL  Hallucination None reported or observed  Judgment Impaired  Confusion None  Danger to Self  Current suicidal ideation? Denies  Danger to Others  Danger to Others None reported or observed

## 2021-04-21 NOTE — [facility] Group Notes (Signed)
Spirituality group facilitated by Chaplain Katy Arlenne Kimbley, B[facility].   Group Description: Group focused on topic of hope. Patients participated in facilitated discussion around topic, connecting with one another around experiences and definitions for hope. Group members engaged with visual explorer photos, reflecting on what hope looks like for them today. Group engaged in discussion around how their definitions of hope are present today in hospital.   Modalities: Psycho-social ed, Adlerian, Narrative, MI   Patient Progress: Did not attend.  

## 2021-04-21 NOTE — Progress Notes (Signed)
Adult Psychoeducational Group Note  Date:  04/21/2021 Time:  8:35 PM  Group Topic/Focus:  Wrap-Up Group:   The focus of this group is to help patients review their daily goal of treatment and discuss progress on daily workbooks.  Participation Level:  Active  Participation Quality:  Appropriate  Affect:  Appropriate  Cognitive:  Appropriate  Insight: Appropriate  Engagement in Group:  Engaged  Modes of Intervention:  Discussion  Additional Comments:  Pt stated her goal for today was to focus on her treatment plan. Pt stated she accomplished her goal today. Pt stated she talk with  her doctor but did not get a chance to speak with her social worker about her care today. Pt rated her overall day a 9 out of 10. Pt stated she was able to contact her son and a family friend  today which improved her overall day. Pt stated she felt better about herself tonight. Pt stated she was able to attend all meals today. Pt stated she was able to attend all groups held today. Pt stated she took all medications provided today. Pt stated her appetite was pretty good today. Pt rated her sleep last night was good. Pt stated the goal tonight was to get some rest. Pt stated she had no physical pain tonight. Pt deny visual hallucinations and auditory issues tonight. Pt denies thoughts of harming herself or others. Pt stated she would alert staff if anything changed  Candy Sledge 04/21/2021, 8:35 PM

## 2021-04-21 NOTE — Group Note (Signed)
Recreation Therapy Group Note   Group Topic:Health and Wellness  Group Date: 04/21/2021 Start Time: 1000 End Time: 1030 Facilitators: Victorino Sparrow, LRT,CTRS Location: [address] Dayroom   Goal Area(s) Addresses:  Patient will verbalize benefit of exercise during group session. Patient will verbalize an exercise that can be completed in their hospital room during admission. Patient will identify an exercise that can be completed post d/c. Patient will acknowledge benefits of exercise when used as a coping mechanism.   Group Description:  Dancing.  LRT talked with patients about the importance of moving the body, explaining it doesn't always have to be regular exercise.  LRT also explained the purpose of the activity today is was the same as if doing an aerobic exercise.  Getting the heart rate up by moving and stretching the muscles was the most important thing.  Patients were to follow along with the music and to decompress, release any tension and calm the mind.   Affect/Mood: N/A   Participation Level: Did not attend    Clinical Observations/Individualized Feedback:     Plan: Continue to engage patient in RT group sessions 2-3x/week.   Victorino Sparrow, LRT,CTRS 04/21/2021 11:21 AM

## 2021-04-21 NOTE — Progress Notes (Signed)
D:  Briona was in the day room much of the evening.  She was quiet but pleasant.  She attended evening wrap up group.  She did report that today was better than yesterday and denied any anxiety or depression.  She continues to worry about her other son but he is safely with other family members at this time.  She denied any pain or discomfort and appeared to be in no acute distress.  Prn's for sleep given with good relief.   A:  1:1 with RN for support and encouragement.  Medications given as ordered.  Q 15 minute checks maintained for safety.  Encouraged participation in group and unit activities.   R:  She is currently resting with her eyes closed and appears to be asleep.  She remains safe on the unit.  We will continue to monitor the progress towards her goals.

## 2021-04-22 LAB — GLUCOSE, CAPILLARY: Glucose-Capillary: 141 mg/dL — ABNORMAL HIGH (ref 70–99)

## 2021-04-22 MED ORDER — RISPERIDONE 1 MG PO TABS: 1.0000 mg | ORAL_TABLET | Freq: Two times a day (BID) | ORAL | 0 refills | Status: DC

## 2021-04-22 MED ORDER — MIRTAZAPINE 30 MG PO TABS: 30.0000 mg | ORAL_TABLET | Freq: Every day | ORAL | 0 refills | Status: DC

## 2021-04-22 MED ORDER — NICOTINE 7 MG/24HR TD PT24: 7.0000 mg | MEDICATED_PATCH | Freq: Every day | TRANSDERMAL | 0 refills | Status: DC | PRN

## 2021-04-22 MED ORDER — GABAPENTIN 100 MG PO CAPS: 200.0000 mg | ORAL_CAPSULE | Freq: Three times a day (TID) | ORAL | 0 refills | Status: DC

## 2021-04-22 MED ORDER — NITROFURANTOIN MONOHYD MACRO 100 MG PO CAPS: 100.0000 mg | ORAL_CAPSULE | Freq: Two times a day (BID) | ORAL | 0 refills | Status: DC

## 2021-04-22 MED ORDER — BENZTROPINE MESYLATE 0.5 MG PO TABS: 0.5000 mg | ORAL_TABLET | Freq: Two times a day (BID) | ORAL | 0 refills | Status: AC | PRN

## 2021-04-22 NOTE — Group Note (Signed)
Recreation Therapy Group Note   Group Topic:Self-Esteem  Group Date: 04/22/2021 Start Time: 1000 End Time: 1040 Facilitators: Victorino Sparrow, LRT,CTRS Location: 500 [address] Dayroom   Goal Area(s) Addresses:  Patient will identify and write at least one positive trait about themself. Patient will acknowledge the benefit of healthy self-esteem. Patient will endorse understanding of ways to increase self-esteem.   Group Description:  LRT began group session with open dialogue asking the patients to define self-esteem and verbally identify positive qualities and traits people may possess. Patients were then instructed to design a personalized license plate, with words and drawings, representing at least 3 positive things about themselves. Pts were encouraged to include favorites, things they are proud of, what they enjoy doing, and dreams for their future. If a patient had a life motto or a meaningful phase that expressed their life values, pt's were asked to incorporate that into their design as well. Patients were given the opportunity to share their completed work with the group.   Affect/Mood: Appropriate   Participation Level: Engaged   Participation Quality: Independent   Behavior: Appropriate   Speech/Thought Process: Focused   Insight: Good   Judgement: Good   Modes of Intervention: Art and Music   Patient Response to Interventions:  Engaged   Education Outcome:  Acknowledges education and In group clarification offered    Clinical Observations/Individualized Feedback: Pt was bright and engaged.  Pt was excited about going home.  Pt highlighted on her plate being from [place], [place], attended [employer] and graduated from [employer] in 1987.  Pt also wrote the phrase "2 sexy 4 you 2".  Pt stated the phrase in on her actually license plate and it's true.     Plan: Continue to engage patient in RT group sessions 2-3x/week.   Victorino Sparrow, LRT,CTRS 04/22/2021 11:36 AM

## 2021-04-22 NOTE — Progress Notes (Signed)
Recreation Therapy Notes  INPATIENT RECREATION TR PLAN  Patient Details Name: Chris Narasimhan MRN: [id number] DOB: February 01, 1966 Today's Date: 04/22/2021  Rec Therapy Plan Is patient appropriate for Therapeutic Recreation?: Yes Treatment times per week: about 3 days Estimated Length of Stay: 5-7 days TR Treatment/Interventions: Group participation (Comment)  Discharge Criteria Pt will be discharged from therapy if:: Discharged Treatment plan/goals/alternatives discussed and agreed upon by:: Patient/family  Discharge Summary Short term goals set: See patient care plan Short term goals met: Complete Progress toward goals comments: Groups attended Which groups?: Self-esteem, Coping skills, Other (Comment) (Self Expression) Reason goals not met: None Therapeutic equipment acquired: N/A Reason patient discharged from therapy: Discharge from hospital Pt/family agrees with progress & goals achieved: Yes Date patient discharged from therapy: 04/22/21   Victorino Sparrow, Vickki Muff, Shadow Schedler A 04/22/2021, 1:05 PM

## 2021-04-22 NOTE — Progress Notes (Signed)
Patient ID: Joanne Gonzalez, female   DOB: [birth year], 56 y.o.   MRN: [id number]   Pt ambulatory, alert, and oriented X4 on and off the unit. Education, support, and encouragement provided. Discharge summary/AVS, prescriptions, medications, and follow up appointments reviewed with pt and a copy of the AVS was given to pt. Suicide prevention resources provided. Pt's belongings in locker 22 returned and belongings sheet signed. Pt denies SI/HI, A/VH, pain, or any concerns at this time. Pt discharged to lobby to [facility] to be transported to her destination.
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
## 2021-04-22 NOTE — Group Note (Signed)
LCSW Group Therapy Note  Group Date: 04/22/2021 Start Time: 1300 End Time: 1400   Type of Therapy and Topic:  Group Therapy: Using "I" Statements  Participation Level:  Did Not Attend  Description of Group:  Patients were asked to provide details of some interpersonal conflicts they have experienced. Patients were then educated about I statements, communication which focuses on feelings or views of the speaker rather than what the other person is doing. T group members were asked to reflect on past conflicts and to provide specific examples for utilizing I statements.  Therapeutic Goals:  Patients will verbalize understanding of ineffective communication and effective communication. Patients will be able to empathize with whom they are having conflict. Patients will practice effective communication in the form of I statements.    Summary of Patient Progress:  Did not attend  Therapeutic Modalities:   Cognitive Behavioral Therapy Solution-Focused Therapy    Vassie Moselle, LCSW 04/22/2021  1:45 PM

## 2021-04-22 NOTE — BHH Counselor (Signed)
ACTT team lead, Colletta [place], met with patient for intake and for patient to get connected to services.    Anyelina Claycomb, LCSW, [place] Social Worker  [facility]

## 2021-04-22 NOTE — Plan of Care (Signed)
Patient was able to identify positive coping skills at completion of recreation therapy group sessions.   Victorino Sparrow, LRT,CTRS

## 2021-04-22 NOTE — Progress Notes (Signed)
°  [facility] Adult Case Management Discharge Plan :  Will you be returning to the same living situation after discharge:  Yes,  [place] At discharge, do you have transportation home?: No. Will need safe transport Do you have the ability to pay for your medications: Yes,  insurance  Release of information consent forms completed and in the chart;  Patient's signature needed at discharge.  Patient to Follow up at:  Follow-up Information     [facility]. Call.   Specialty: Hospice and Palliative Medicine Why: Please call to personally schedule an appointment for grief/bereavement therapy services. Contact information: [place] Follow up.   Specialty: Behavioral Health Contact information: [place] Follow up.   Why: You have been referred for ACTT services.  They will complete an intake, please continue to follow up for ongoing services. Contact information: [place] [address] [phone]                 Next level of care provider has access to [facility] and Suicide Prevention discussed: Yes,  [place]     Has patient been referred to the Quitline?: Patient refused referral  Patient has been referred for addiction treatment: Yes- ACTT and [place], LCSW 04/22/2021, 10:45 AM

## 2021-04-22 NOTE — BHH Group Notes (Signed)
[place] Group Notes:  (Nursing/MHT/Case Management/Adjunct)  Date:  04/22/2021  Time:  10:28 AM  Type of Therapy:   Orientation/Goals group  Participation Level:  Active  Participation Quality:  Appropriate  Affect:  Appropriate  Cognitive:  Appropriate  Insight:  Appropriate  Engagement in Group:  Engaged  Modes of Intervention:  Discussion, Education, and Orientation  Summary of Progress/Problems: Pt goal for today is to try to focus on positive things and to get discharged.   Bernardino Dowell J Yaniris Braddock 04/22/2021, 10:28 AM
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
## 2021-04-24 ENCOUNTER — Telehealth: Payer: Self-pay | Admitting: Physician Assistant

## 2021-04-24 ENCOUNTER — Telehealth: Payer: Self-pay | Admitting: Pharmacist

## 2021-04-24 ENCOUNTER — Ambulatory Visit: Payer: Self-pay | Admitting: *Deleted

## 2021-04-24 ENCOUNTER — Other Ambulatory Visit: Payer: Self-pay | Admitting: Pharmacist

## 2021-04-24 DIAGNOSIS — R7303 Prediabetes: Secondary | ICD-10-CM

## 2021-04-24 DIAGNOSIS — F5104 Psychophysiologic insomnia: Secondary | ICD-10-CM

## 2021-04-24 MED ORDER — ACCU-CHEK GUIDE VI STRP: ORAL_STRIP | 2 refills | Status: DC

## 2021-04-24 MED ORDER — ACCU-CHEK SOFTCLIX LANCETS MISC: 2 refills | Status: DC

## 2021-04-24 MED ORDER — ACCU-CHEK GUIDE W/DEVICE KIT: PACK | 0 refills | Status: AC

## 2021-04-24 MED ORDER — METFORMIN HCL 500 MG PO TABS: 500.0000 mg | ORAL_TABLET | Freq: Every day | ORAL | 1 refills | Status: DC

## 2021-04-24 MED ORDER — TRAZODONE HCL 150 MG PO TABS: 150.0000 mg | ORAL_TABLET | Freq: Every evening | ORAL | 1 refills | Status: DC | PRN

## 2021-04-24 NOTE — Telephone Encounter (Signed)
Rx sent 

## 2021-04-24 NOTE — Telephone Encounter (Signed)
Called to request medication refills since being discharged from [facility] 2 days ago. Denies symptoms . Requesting trazodone and metformin.

## 2021-04-24 NOTE — Telephone Encounter (Signed)
Medication: traZODone (DESYREL) 150 MG tablet [[id number]] , metFORMIN (GLUCOPHAGE) 500 MG tablet [[id number]] , Pt is requesting a glucometer. Does not know the previous brand  Has the patient contacted their pharmacy? YES  (Agent: If no, request that the patient contact the pharmacy for the refill. If patient does not wish to contact the pharmacy document the reason why and proceed with request.) (Agent: If yes, when and what did the pharmacy advise?)  Preferred Pharmacy (with phone number or street name): [place], [place] [address] Phone: [phone] Fax: [phone] Hours: Not open 24 hours   Has the patient been seen for an appointment in the last year OR does the patient have an upcoming appointment? YES   Agent: Please be advised that RX refills may take up to 3 business days. We ask that you follow-up with your pharmacy.

## 2021-04-24 NOTE — Telephone Encounter (Signed)
Received triage note requesting refills for metformin and trazodone to be sent to [facility]. Rxs sent.

## 2021-04-24 NOTE — Telephone Encounter (Signed)
Pt is requesting a glucometer. Does not know the previous brand.  Medications already sent to [facility] today in another encounter.

## 2021-05-11 ENCOUNTER — Ambulatory Visit: Payer: Medicaid Other | Admitting: Internal Medicine

## 2021-05-13 ENCOUNTER — Ambulatory Visit: Payer: Self-pay

## 2021-05-13 NOTE — Telephone Encounter (Signed)
°  Chief Complaint: vaginal bleeding Symptoms: vaginal bleeding that occurs frequently and is heavy flow, abd cramping Frequency: ongoing for months Pertinent Negatives: NA Disposition: [] ED /[] Urgent Care (no appt availability in office) / [] Appointment(In office/virtual)/ []  [facility]/ [] Home Care/ [] Refused Recommended Disposition /[x] [facility]/ []  Follow-up with PCP Additional Notes: Pt is concerned about bleeding she has been having and feels like it occurs more than not at this point. She states she soaks a tampon and pad every 2 hrs. Pt states she was taking a birth control previously while at [facility] that helped but has been out for a while now. Pt is also concerned that with her NPA being rescheduled to 07/06/21 she will run out of gabapentin her leg pain is severe when she runs out. Advised her she can go to [facility] for the mobile unit to be seen. Gave location and times for her to set up transportation for this upcoming Monday.    Reason for Disposition  [1] Periods with > 6 soaked pads or tampons per day AND [2] last > 7 days  Answer Assessment - Initial Assessment Questions 1. AMOUNT: "Describe the bleeding that you are having."    - SPOTTING: spotting, or pinkish / brownish mucous discharge; does not fill panty liner or pad    - MILD:  less than 1 pad / hour; less than patient's usual menstrual bleeding   - MODERATE: 1-2 pads / hour; 1 menstrual cup every 6 hours; small-medium blood clots (e.g., pea, grape, small coin)   - SEVERE: soaking 2 or more pads/hour for 2 or more hours; 1 menstrual cup every 2 hours; bleeding not contained by pads or continuous red blood from vagina; large blood clots (e.g., golf ball, large coin)      Changing pad and tampon every 2 hours  2. ONSET: "When did the bleeding begin?" "Is it continuing now?"     Stopped Friday but feels like starting again  3. MENSTRUAL PERIOD: "When was the last normal menstrual period?" "How is this  different than your period?"     Cant recall 4. REGULARITY: "How regular are your periods?"     No 5. ABDOMINAL PAIN: "Do you have any pain?" "How bad is the pain?"  (e.g., Scale 1-10; mild, moderate, or severe)   - MILD (1-3): doesn't interfere with normal activities, abdomen soft and not tender to touch    - MODERATE (4-7): interferes with normal activities or awakens from sleep, abdomen tender to touch    - SEVERE (8-10): excruciating pain, doubled over, unable to do any normal activities      6 8. HORMONES: "Are you taking any hormone medications, prescription or OTC?" (e.g., birth control pills, estrogen)     No 9. BLOOD THINNERS: "Do you take any blood thinners?" (e.g., Coumadin/warfarin, Pradaxa/dabigatran, aspirin)     No 11. HEMODYNAMIC STATUS: "Are you weak or feeling lightheaded?" If Yes, ask: "Can you stand and walk normally?"        No 12. OTHER SYMPTOMS: "What other symptoms are you having with the bleeding?" (e.g., passed tissue, vaginal discharge, fever, menstrual-type cramps)       Cramps  Protocols used: Vaginal Bleeding - Abnormal-A-AH

## 2021-06-09 ENCOUNTER — Emergency Department (HOSPITAL_COMMUNITY): Payer: Medicaid Other

## 2021-06-09 ENCOUNTER — Encounter (HOSPITAL_COMMUNITY): Payer: Self-pay | Admitting: Emergency Medicine

## 2021-06-09 ENCOUNTER — Other Ambulatory Visit: Payer: Self-pay

## 2021-06-09 ENCOUNTER — Emergency Department (HOSPITAL_COMMUNITY)
Admission: EM | Admit: 2021-06-09 | Discharge: 2021-06-10 | Disposition: A | Discharge status: Home / Self Care | Payer: Medicaid Other | Attending: Emergency Medicine | Admitting: Emergency Medicine

## 2021-06-09 DIAGNOSIS — Z7984 Long term (current) use of oral hypoglycemic drugs: Secondary | ICD-10-CM | POA: Diagnosis not present

## 2021-06-09 DIAGNOSIS — N9489 Other specified conditions associated with female genital organs and menstrual cycle: Secondary | ICD-10-CM | POA: Insufficient documentation

## 2021-06-09 DIAGNOSIS — R55 Syncope and collapse: Secondary | ICD-10-CM | POA: Insufficient documentation

## 2021-06-09 DIAGNOSIS — R102 Pelvic and perineal pain: Secondary | ICD-10-CM | POA: Insufficient documentation

## 2021-06-09 DIAGNOSIS — N939 Abnormal uterine and vaginal bleeding, unspecified: Secondary | ICD-10-CM | POA: Insufficient documentation

## 2021-06-09 LAB — URINALYSIS, ROUTINE W REFLEX MICROSCOPIC
Bilirubin Urine: NEGATIVE
Glucose, UA: NEGATIVE mg/dL
Ketones, ur: NEGATIVE mg/dL
Leukocytes,Ua: NEGATIVE
Nitrite: NEGATIVE
Protein, ur: NEGATIVE mg/dL
Specific Gravity, Urine: 1.016 (ref 1.005–1.030)
pH: 6 (ref 5.0–8.0)

## 2021-06-09 LAB — TYPE AND SCREEN
ABO/RH(D): O POS
Antibody Screen: NEGATIVE

## 2021-06-09 LAB — CBC WITH DIFFERENTIAL/PLATELET
Abs Immature Granulocytes: 0.01 10*3/uL (ref 0.00–0.07)
Basophils Absolute: 0.1 10*3/uL (ref 0.0–0.1)
Basophils Relative: 2 %
Eosinophils Absolute: 0.2 10*3/uL (ref 0.0–0.5)
Eosinophils Relative: 4 %
HCT: 38.2 % (ref 36.0–46.0)
Hemoglobin: 12.7 g/dL (ref 12.0–15.0)
Immature Granulocytes: 0 %
Lymphocytes Relative: 35 %
Lymphs Abs: 2.1 10*3/uL (ref 0.7–4.0)
MCH: 31 pg (ref 26.0–34.0)
MCHC: 33.2 g/dL (ref 30.0–36.0)
MCV: 93.2 fL (ref 80.0–100.0)
Monocytes Absolute: 0.5 10*3/uL (ref 0.1–1.0)
Monocytes Relative: 8 %
Neutro Abs: 3.2 10*3/uL (ref 1.7–7.7)
Neutrophils Relative %: 51 %
Platelets: 317 10*3/uL (ref 150–400)
RBC: 4.1 MIL/uL (ref 3.87–5.11)
RDW: 13.3 % (ref 11.5–15.5)
WBC: 6.2 10*3/uL (ref 4.0–10.5)
nRBC: 0 % (ref 0.0–0.2)

## 2021-06-09 LAB — I-STAT BETA HCG BLOOD, ED (MC, WL, AP ONLY): I-stat hCG, quantitative: <5 m[IU]/mL (ref <5)

## 2021-06-09 LAB — COMPREHENSIVE METABOLIC PANEL
ALT: 29 U/L (ref 0–44)
AST: 24 U/L (ref 15–41)
Albumin: 3.9 g/dL (ref 3.5–5.0)
Alkaline Phosphatase: 57 U/L (ref 38–126)
Anion gap: 9 (ref 5–15)
BUN: 8 mg/dL (ref 6–20)
CO2: 19 mmol/L — ABNORMAL LOW (ref 22–32)
Calcium: 9.2 mg/dL (ref 8.9–10.3)
Chloride: 109 mmol/L (ref 98–111)
Creatinine, Ser: 0.95 mg/dL (ref 0.44–1.00)
GFR, Estimated: >60 mL/min (ref >60)
Glucose, Bld: 99 mg/dL (ref 70–99)
Potassium: 3.6 mmol/L (ref 3.5–5.1)
Sodium: 137 mmol/L (ref 135–145)
Total Bilirubin: 0.3 mg/dL (ref 0.3–1.2)
Total Protein: 7.4 g/dL (ref 6.5–8.1)

## 2021-06-09 LAB — ABO/RH: ABO/RH(D): O POS

## 2021-06-09 NOTE — ED Triage Notes (Signed)
Pt states she is been having some heavy vaginal bleeding for the past 30 days and she had a syncope episode yesterday night. Pt is AO x 4 on triage, NAD noticed. ?

## 2021-06-09 NOTE — ED Provider Notes (Signed)
?[facility] ?Provider Note ? ? ?CSN: [id number] ?Arrival date & time: 06/09/21  1923 ? ?  ? ?History ? ?Chief Complaint  ?Patient presents with  ? Loss of Consciousness  ? ? ?Joanne Gonzalez is a 56 y.o. female with a hx of CVA, schizophrenia, seizures, dysmenorrhea, and substance use who presents to the ED with complaints of vaginal bleeding for the past 30 days.  Patient states that she is having daily vaginal bleeding with pelvic cramping, got worse today with passage of small clots.  She was seen by [facility] for this problem about 2 weeks ago and had negative STD testing at that time, was given 6 days of what she thinks with birth control to take once per day which she took without relief.  She states that with this she has felt very fatigued, she has had poor p.o. intake, and has felt lightheaded with quick position changes.  She states that yesterday she was at her engagement party she quickly stood up got lightheaded and passed out briefly.  She denies fever, chest pain, dyspnea (despite triage note), or vomiting.  She is currently sexually active in a monogamous relationship without concern for STD. ? ?HPI ? ?  ? ?Home Medications ?Prior to Admission medications   ?Medication Sig Start Date End Date Taking? Authorizing Provider  ?Accu-Chek Softclix Lancets lancets Use to check blood sugar once daily. R73.03 04/24/21   Ladell Pier, MD  ?benztropine (COGENTIN) 0.5 MG tablet Take 1 tablet (0.5 mg total) by mouth 2 (two) times daily as needed for tremors (EPS). 04/22/21   Freida Busman, MD  ?Blood Glucose Monitoring Suppl (ACCU-CHEK GUIDE) w/Device KIT Use to check blood sugar once daily. R73.03 04/24/21   Ladell Pier, MD  ?gabapentin (NEURONTIN) 100 MG capsule Take 2 capsules (200 mg total) by mouth 3 (three) times daily. 04/22/21   Freida Busman, MD  ?glucose blood (ACCU-CHEK GUIDE) test strip Use to check blood sugar once daily. R73.03 04/24/21    Ladell Pier, MD  ?metFORMIN (GLUCOPHAGE) 500 MG tablet Take 1 tablet (500 mg total) by mouth daily with breakfast. 04/24/21   Charlott Rakes, MD  ?mirtazapine (REMERON) 30 MG tablet Take 1 tablet (30 mg total) by mouth at bedtime. 04/22/21   Freida Busman, MD  ?nicotine (NICODERM CQ - DOSED IN MG/24 HR) 7 mg/24hr patch Place 1 patch (7 mg total) onto the skin daily as needed (smoking cessation). 04/22/21   Freida Busman, MD  ?nitrofurantoin, macrocrystal-monohydrate, (MACROBID) 100 MG capsule Take 1 capsule (100 mg total) by mouth every 12 (twelve) hours. 04/22/21   Freida Busman, MD  ?omeprazole (PRILOSEC) 20 MG capsule Take 1 capsule (20 mg total) by mouth daily. ?Patient taking differently: Take 20 mg by mouth daily as needed (for heartburn or reflux). 03/24/21   Mayers, Cari S, PA-C  ?QUEtiapine 150 MG TABS Take 150 mg by mouth at bedtime. 03/09/21   Mayers, Cari S, PA-C  ?risperiDONE (RISPERDAL) 1 MG tablet Take 1 tablet (1 mg total) by mouth 2 (two) times daily. 04/22/21   Freida Busman, MD  ?traZODone (DESYREL) 150 MG tablet Take 1 tablet (150 mg total) by mouth at bedtime as needed for sleep. 04/24/21   Charlott Rakes, MD  ?Vitamin D, Ergocalciferol, (DRISDOL) 1.25 MG (50000 UNIT) CAPS capsule Take 1 capsule (50,000 Units total) by mouth every 7 (seven) days. ?Patient taking differently: Take 50,000 Units by mouth every Tuesday. 03/10/21  Mayers, Cari S, PA-C  ?sertraline (ZOLOFT) 50 MG tablet Take 1 tablet (50 mg total) by mouth daily. ?Patient not taking: Reported on 03/14/2015 09/02/14 03/14/15  Luan Moore, MD  ?   ? ?Allergies    ?Keflex [cephalexin], Penicillins, Bactrim [sulfamethoxazole-trimethoprim], and Lactose intolerance (gi)   ? ?Review of Systems   ?Review of Systems  ?Constitutional:  Positive for appetite change and fatigue. Negative for chills and fever.  ?Respiratory:  Negative for cough and shortness of breath.   ?Cardiovascular:  Negative for chest pain.  ?Gastrointestinal:   Negative for vomiting.  ?Genitourinary:  Positive for pelvic pain and vaginal bleeding. Negative for decreased urine volume.  ?Neurological:  Positive for syncope, weakness (generalized) and light-headedness.  ?All other systems reviewed and are negative. ? ?Physical Exam ?Updated Vital Signs ?BP (!) 154/80   Pulse 70   Temp 99 ?F (37.2 ?C) (Oral)   Resp (!) 21   Ht _0  (1.651 m)   Wt 72.6 kg   SpO2 99%   BMI 26.63 kg/m?  ?Physical Exam ?Vitals and nursing note reviewed.  ?Constitutional:   ?   General: She is not in acute distress. ?   Appearance: She is well-developed. She is not toxic-appearing.  ?HENT:  ?   Head: Normocephalic and atraumatic.  ?Eyes:  ?   General:     ?   Right eye: No discharge.     ?   Left eye: No discharge.  ?   Conjunctiva/sclera: Conjunctivae normal.  ?Cardiovascular:  ?   Rate and Rhythm: Normal rate and regular rhythm.  ?Pulmonary:  ?   Effort: No respiratory distress.  ?   Breath sounds: Normal breath sounds. No wheezing or rales.  ?Abdominal:  ?   General: There is no distension.  ?   Palpations: Abdomen is soft.  ?   Tenderness: There is no abdominal tenderness. There is no guarding or rebound.  ?Genitourinary: ?   Comments: Patient declined pelvic exam.  ?Musculoskeletal:  ?   Cervical back: Neck supple.  ?Skin: ?   General: Skin is warm and dry.  ?Neurological:  ?   Mental Status: She is alert.  ?   Comments: Clear speech. PERRL. EOMI. Moving all extremities.   ?Psychiatric:     ?   Behavior: Behavior normal.  ? ? ?ED Results / Procedures / Treatments   ?Labs ?(all labs ordered are listed, but only abnormal results are displayed) ?Labs Reviewed  ?COMPREHENSIVE METABOLIC PANEL - Abnormal; Notable for the following components:  ?    Result Value  ? CO2 19 (*)   ? All other components within normal limits  ?URINALYSIS, ROUTINE W REFLEX MICROSCOPIC - Abnormal; Notable for the following components:  ? APPearance HAZY (*)   ? Hgb urine dipstick SMALL (*)   ? Bacteria, UA RARE (*)    ? All other components within normal limits  ?CBC WITH DIFFERENTIAL/PLATELET  ?I-STAT BETA HCG BLOOD, ED ([facility], [facility], [facility] ONLY)  ?TYPE AND SCREEN  ?ABO/RH  ? ? ?EKG ?None ? ?Radiology ?US Transvaginal Non-OB ? ?Result Date: 06/09/2021 ?CLINICAL DATA:  Vaginal bleeding. EXAM: TRANSABDOMINAL AND TRANSVAGINAL ULTRASOUND OF PELVIS DOPPLER ULTRASOUND OF OVARIES TECHNIQUE: Both transabdominal and transvaginal ultrasound examinations of the pelvis were performed. Transabdominal technique was performed for global imaging of the pelvis including uterus, ovaries, adnexal regions, and pelvic cul-de-sac. It was necessary to proceed with endovaginal exam following the transabdominal exam to visualize the ovaries and endometrium. Color and duplex Doppler ultrasound  was utilized to evaluate blood flow to the ovaries. COMPARISON:  Pelvic ultrasound 11/23/2016. FINDINGS: Uterus Measurements: 11.2 x 6.5 x 8.1 cm = volume: 310 mL. Diffusely heterogeneous myometrium. No focal lesions identified. Endometrium Thickness: 3.5 cm. Heterogeneous containing some vascularity. No focal lesion identified. Right ovary Measurements: 2.0 x 1.2 x 2.2 cm = volume: 2.8 mL. Normal appearance/no adnexal mass. Left ovary Measurements: 2.4 x 1.4 x 1.5 cm = volume: 2.8 mL. Normal appearance/no adnexal mass. Pulsed Doppler evaluation of both ovaries demonstrates normal low-resistance arterial and venous waveforms. Other findings No abnormal free fluid. IMPRESSION: 1. Thickened heterogeneous endometrium measuring 3.5 cm containing some vascularity. No focal lesion identified in the endometrium, although given thickening and underlying lesion would be difficult to exclude. If bleeding remains unresponsive to hormonal or medical therapy, focal lesion work-up with sonohysterogram should be considered. Endometrial biopsy should also be considered in pre-menopausal patients at high risk for endometrial carcinoma. (Ref: Radiological Reasoning: Algorithmic Workup of  Abnormal Vaginal Bleeding with Endovaginal Sonography and Sonohysterography. AJR 2008; [id number]:[id number]) 2. Heterogeneous uterine myometrium may be related to diffuse fibroid change. Electronically Signed   By: Warren Lacy

## 2021-06-09 NOTE — ED Provider Triage Note (Signed)
Emergency Medicine Provider Triage Evaluation Note ? ?Joanne Gonzalez , a 56 y.o. female  was evaluated in triage.  Pt complains of vaginal bleeding x30 days.  States that she is used the whole pack of pads and 2 boxes of tampons for the last 2 days.  She she currently has 2 pads and 3 tampons in place and is concerned that she may bleed through and is already changed once in the 40 minutes she is been in the emergency department.  Endorses severe lower abdominal cramping.  Syncopal episode yesterday with lightheadedness and shortness of breath exertion ? ?Review of Systems  ?Positive: Vaginal bleeding, abdominal cramping, shortness of breath with exertion, syncope ?Negative: Chest pain ? ?Physical Exam  ?BP (!) 174/78 (BP Location: Right Arm)   Pulse 71   Temp 99 ?F (37.2 ?C) (Oral)   Resp 16   Ht '5\' 5"'$  (1.651 m)   Wt 72.6 kg   SpO2 95%   BMI 26.63 kg/m?  ?Gen:   Awake, no distress   ?Resp:  Normal effort  ?MSK:   Moves extremities without difficulty  ?Other:  RRR no etc. Cixi.  Lung CTA B.  Abdominal exam is benign. ? ?Medical Decision Making  ?Medically screening exam initiated at 8:06 PM.  Appropriate orders placed.  [name] was informed that the remainder of the evaluation will be completed by another provider, this initial triage assessment does not replace that evaluation, and the importance of remaining in the ED until their evaluation is complete. ? ?This chart was dictated using voice recognition software, Dragon. Despite the best efforts of this provider to proofread and correct errors, errors may still occur which can change documentation meaning. ? ?  ?Emeline Darling, PA-C ?06/09/21 2012 ? ?

## 2021-06-10 MED ORDER — NAPROXEN 500 MG PO TABS: 500.0000 mg | ORAL_TABLET | Freq: Two times a day (BID) | ORAL | 0 refills | Status: DC | PRN

## 2021-06-10 MED ORDER — SODIUM CHLORIDE 0.9 % IV BOLUS
1000.0000 mL | Freq: Once | INTRAVENOUS | Status: AC
  Administered 2021-06-10: 1000 mL via INTRAVENOUS

## 2021-06-10 MED ORDER — MEGESTROL ACETATE 40 MG PO TABS: 40.0000 mg | ORAL_TABLET | Freq: Two times a day (BID) | ORAL | 0 refills | Status: DC

## 2021-06-10 MED ORDER — KETOROLAC TROMETHAMINE 15 MG/ML IJ SOLN
15.0000 mg | Freq: Once | INTRAMUSCULAR | Status: AC
Start: 2021-06-10 — End: 2021-06-10
  Administered 2021-06-10: 15 mg via INTRAVENOUS
  Filled 2021-06-10: qty 1

## 2021-06-10 NOTE — Discharge Instructions (Addendum)
You were seen in the emergency department today for vaginal bleeding and passing out.  Your blood work was overall reassuring.  Your ultrasound did show that your endometrium is thickened, it is extremely important that you follow-up with gynecology as soon as possible.  In the meantime we are starting you on Megace which is a hormone to try to help stop the bleeding.  Please take 1 tablet (40 mg) twice per day for the next month.  If after 1 week you continue to have bleeding you may increase this to 2 tablets (80 mg) twice per day. ? ?We have prescribed you new medication(s) today. Discuss the medications prescribed today with your pharmacist as they can have adverse effects and interactions with your other medicines including over the counter and prescribed medications. Seek medical evaluation if you start to experience new or abnormal symptoms after taking one of these medicines, seek care immediately if you start to experience difficulty breathing, feeling of your throat closing, facial swelling, or rash as these could be indications of a more serious allergic reaction ? ?We are sending you home with naproxen to help with pain. ?- Naproxen- this is a nonsteroidal anti-inflammatory medication that will help with pain and swelling. Be sure to take this medication as prescribed with food, 1 pill every 12 hours,  It should be taken with food, as it can cause stomach upset, and more seriously, stomach bleeding. Do not take other nonsteroidal anti-inflammatory medications with this such as Advil, Motrin, Aleve, Mobic, Goodie Powder, or Motrin etc..   ? ?We have prescribed you new medication(s) today. Discuss the medications prescribed today with your pharmacist as they can have adverse effects and interactions with your other medicines including over the counter and prescribed medications. Seek medical evaluation if you start to experience new or abnormal symptoms after taking one of these medicines, seek care  immediately if you start to experience difficulty breathing, feeling of your throat closing, facial swelling, or rash as these could be indications of a more serious allergic reaction ? ?Please follow-up with your OB/GYN as soon as possible.  If you are not having improvement in your endometrial lining you may need a biopsy. If you cannot follow up with your doctor for some reason we have provided information for the [facility].  ? ?In terms of passing out this may have been due to a drop in your blood pressure related to not eating and drinking throughout the day.  It is very important that you drink 8 glasses of water per day, have 3 meals, and snacks as needed.  Please follow-up closely with your primary care provider regarding this.  Return to the ER for any new or worsening symptoms including but not limited to new or worsening pain, recurrence of passing out, chest pain, trouble breathing, fever, or any other concerns. ? ?

## 2021-06-30 ENCOUNTER — Other Ambulatory Visit: Payer: Self-pay | Admitting: Family Medicine

## 2021-06-30 DIAGNOSIS — R7303 Prediabetes: Secondary | ICD-10-CM

## 2021-07-06 ENCOUNTER — Ambulatory Visit: Payer: Medicaid Other | Attending: Internal Medicine | Admitting: Internal Medicine

## 2021-07-06 ENCOUNTER — Encounter: Payer: Self-pay | Admitting: Internal Medicine

## 2021-07-06 VITALS — BP 110/84 | HR 94 | Resp 16 | Ht 65.0 in | Wt 158.6 lb

## 2021-07-06 DIAGNOSIS — Z79899 Other long term (current) drug therapy: Secondary | ICD-10-CM | POA: Insufficient documentation

## 2021-07-06 DIAGNOSIS — Z8673 Personal history of transient ischemic attack (TIA), and cerebral infarction without residual deficits: Secondary | ICD-10-CM | POA: Diagnosis not present

## 2021-07-06 DIAGNOSIS — N939 Abnormal uterine and vaginal bleeding, unspecified: Secondary | ICD-10-CM | POA: Insufficient documentation

## 2021-07-06 DIAGNOSIS — N938 Other specified abnormal uterine and vaginal bleeding: Secondary | ICD-10-CM

## 2021-07-06 DIAGNOSIS — F17210 Nicotine dependence, cigarettes, uncomplicated: Secondary | ICD-10-CM

## 2021-07-06 DIAGNOSIS — F329 Major depressive disorder, single episode, unspecified: Secondary | ICD-10-CM | POA: Insufficient documentation

## 2021-07-06 DIAGNOSIS — D509 Iron deficiency anemia, unspecified: Secondary | ICD-10-CM | POA: Diagnosis not present

## 2021-07-06 DIAGNOSIS — F1410 Cocaine abuse, uncomplicated: Secondary | ICD-10-CM | POA: Diagnosis not present

## 2021-07-06 DIAGNOSIS — I10 Essential (primary) hypertension: Secondary | ICD-10-CM | POA: Insufficient documentation

## 2021-07-06 DIAGNOSIS — F17200 Nicotine dependence, unspecified, uncomplicated: Secondary | ICD-10-CM

## 2021-07-06 DIAGNOSIS — Z8659 Personal history of other mental and behavioral disorders: Secondary | ICD-10-CM | POA: Diagnosis not present

## 2021-07-06 DIAGNOSIS — R7303 Prediabetes: Secondary | ICD-10-CM | POA: Diagnosis not present

## 2021-07-06 DIAGNOSIS — Z1231 Encounter for screening mammogram for malignant neoplasm of breast: Secondary | ICD-10-CM | POA: Insufficient documentation

## 2021-07-06 DIAGNOSIS — Z7689 Persons encountering health services in other specified circumstances: Secondary | ICD-10-CM | POA: Diagnosis not present

## 2021-07-06 DIAGNOSIS — Z7984 Long term (current) use of oral hypoglycemic drugs: Secondary | ICD-10-CM | POA: Diagnosis not present

## 2021-07-06 LAB — GLUCOSE, POCT (MANUAL RESULT ENTRY): POC Glucose: 130 mg/dl — AB (ref 70–99)

## 2021-07-06 MED ORDER — MEGESTROL ACETATE 40 MG PO TABS: 40.0000 mg | ORAL_TABLET | Freq: Two times a day (BID) | ORAL | 0 refills | Status: AC
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
## 2021-07-07 ENCOUNTER — Telehealth: Payer: Self-pay | Admitting: Internal Medicine

## 2021-07-07 ENCOUNTER — Encounter (HOSPITAL_COMMUNITY): Payer: Self-pay | Admitting: Emergency Medicine

## 2021-07-07 ENCOUNTER — Emergency Department (HOSPITAL_COMMUNITY)
Admission: EM | Admit: 2021-07-07 | Discharge: 2021-07-09 | Disposition: A | Discharge status: Other Acute Inpatient Hospital | Payer: Medicaid Other | Attending: Student | Admitting: Student

## 2021-07-07 ENCOUNTER — Other Ambulatory Visit: Payer: Self-pay

## 2021-07-07 DIAGNOSIS — F1910 Other psychoactive substance abuse, uncomplicated: Secondary | ICD-10-CM

## 2021-07-07 DIAGNOSIS — Z5900 Homelessness unspecified: Secondary | ICD-10-CM | POA: Diagnosis not present

## 2021-07-07 DIAGNOSIS — D649 Anemia, unspecified: Secondary | ICD-10-CM | POA: Insufficient documentation

## 2021-07-07 DIAGNOSIS — R45851 Suicidal ideations: Secondary | ICD-10-CM | POA: Diagnosis not present

## 2021-07-07 DIAGNOSIS — Y900 Blood alcohol level of less than 20 mg/100 ml: Secondary | ICD-10-CM | POA: Insufficient documentation

## 2021-07-07 DIAGNOSIS — Z20822 Contact with and (suspected) exposure to covid-19: Secondary | ICD-10-CM | POA: Insufficient documentation

## 2021-07-07 DIAGNOSIS — Z87891 Personal history of nicotine dependence: Secondary | ICD-10-CM | POA: Insufficient documentation

## 2021-07-07 DIAGNOSIS — F1914 Other psychoactive substance abuse with psychoactive substance-induced mood disorder: Secondary | ICD-10-CM | POA: Diagnosis not present

## 2021-07-07 DIAGNOSIS — F203 Undifferentiated schizophrenia: Secondary | ICD-10-CM | POA: Diagnosis not present

## 2021-07-07 DIAGNOSIS — F1994 Other psychoactive substance use, unspecified with psychoactive substance-induced mood disorder: Secondary | ICD-10-CM | POA: Diagnosis not present

## 2021-07-07 DIAGNOSIS — F209 Schizophrenia, unspecified: Secondary | ICD-10-CM

## 2021-07-07 DIAGNOSIS — Z8616 Personal history of COVID-19: Secondary | ICD-10-CM | POA: Diagnosis not present

## 2021-07-07 DIAGNOSIS — Z046 Encounter for general psychiatric examination, requested by authority: Secondary | ICD-10-CM | POA: Diagnosis present

## 2021-07-07 DIAGNOSIS — F1490 Cocaine use, unspecified, uncomplicated: Secondary | ICD-10-CM

## 2021-07-07 DIAGNOSIS — F1590 Other stimulant use, unspecified, uncomplicated: Secondary | ICD-10-CM | POA: Diagnosis present

## 2021-07-07 DIAGNOSIS — F1410 Cocaine abuse, uncomplicated: Secondary | ICD-10-CM | POA: Insufficient documentation

## 2021-07-07 DIAGNOSIS — E876 Hypokalemia: Secondary | ICD-10-CM | POA: Insufficient documentation

## 2021-07-07 LAB — CBC
Hematocrit: 38.2 % (ref 34.0–46.6)
Hemoglobin: 12.3 g/dL (ref 11.1–15.9)
MCH: 29.1 pg (ref 26.6–33.0)
MCHC: 32.2 g/dL (ref 31.5–35.7)
MCV: 90 fL (ref 79–97)
Platelets: 343 10*3/uL (ref 150–450)
RBC: 4.23 x10E6/uL (ref 3.77–5.28)
RDW: 12.8 % (ref 11.7–15.4)
WBC: 6.7 10*3/uL (ref 3.4–10.8)

## 2021-07-07 NOTE — Telephone Encounter (Signed)
-----   Message from Ena Dawley sent at 07/07/2021  8:20 AM EDT ----- ?Regarding: Gyn ?I sent the referral to   [facility] [place]   ?According to Epic   patient already schedule  with [facility]   ? ?07/09/2021 ?2:00 PM 07/09/2021 ?2:15 PM ? ?Providers ? ?Hendricks Milo, MD ?Obstetrics ?NPI: [id number] ?[address] ?[place] [address] ?? ?Phone: [phone] ?Fax: [phone] ? ? ?----- Message ----- ?From: Ladell Pier, MD ?Sent: 07/06/2021   5:35 PM EDT ?To: Ena Dawley ? ?Try to get her in with gynecology as soon as possible. ? ? ?

## 2021-07-07 NOTE — ED Triage Notes (Signed)
Patient needing detox from cocaine.  She denies any SI/HI but she is depressed.   ?

## 2021-07-08 ENCOUNTER — Encounter (HOSPITAL_COMMUNITY): Payer: Self-pay | Admitting: Registered Nurse

## 2021-07-08 ENCOUNTER — Other Ambulatory Visit: Payer: Self-pay

## 2021-07-08 DIAGNOSIS — F203 Undifferentiated schizophrenia: Secondary | ICD-10-CM

## 2021-07-08 DIAGNOSIS — F1994 Other psychoactive substance use, unspecified with psychoactive substance-induced mood disorder: Secondary | ICD-10-CM

## 2021-07-08 DIAGNOSIS — F1410 Cocaine abuse, uncomplicated: Secondary | ICD-10-CM

## 2021-07-08 DIAGNOSIS — Z5900 Homelessness unspecified: Secondary | ICD-10-CM

## 2021-07-08 LAB — COMPREHENSIVE METABOLIC PANEL
ALT: 22 U/L (ref 0–44)
AST: 19 U/L (ref 15–41)
Albumin: 4.1 g/dL (ref 3.5–5.0)
Alkaline Phosphatase: 57 U/L (ref 38–126)
Anion gap: 7 (ref 5–15)
BUN: 9 mg/dL (ref 6–20)
CO2: 20 mmol/L — ABNORMAL LOW (ref 22–32)
Calcium: 9.4 mg/dL (ref 8.9–10.3)
Chloride: 109 mmol/L (ref 98–111)
Creatinine, Ser: 0.63 mg/dL (ref 0.44–1.00)
GFR, Estimated: >60 mL/min (ref >60)
Glucose, Bld: 97 mg/dL (ref 70–99)
Potassium: 3.4 mmol/L — ABNORMAL LOW (ref 3.5–5.1)
Sodium: 136 mmol/L (ref 135–145)
Total Bilirubin: 0.4 mg/dL (ref 0.3–1.2)
Total Protein: 7.9 g/dL (ref 6.5–8.1)

## 2021-07-08 LAB — RESP PANEL BY RT-PCR (FLU A&B, COVID) ARPGX2
Influenza A by PCR: NEGATIVE
Influenza B by PCR: NEGATIVE
SARS Coronavirus 2 by RT PCR: NEGATIVE

## 2021-07-08 LAB — CBC WITH DIFFERENTIAL/PLATELET
Abs Immature Granulocytes: 0.02 10*3/uL (ref 0.00–0.07)
Basophils Absolute: 0.1 10*3/uL (ref 0.0–0.1)
Basophils Relative: 1 %
Eosinophils Absolute: 0.3 10*3/uL (ref 0.0–0.5)
Eosinophils Relative: 3 %
HCT: 35.3 % — ABNORMAL LOW (ref 36.0–46.0)
Hemoglobin: 11.6 g/dL — ABNORMAL LOW (ref 12.0–15.0)
Immature Granulocytes: 0 %
Lymphocytes Relative: 32 %
Lymphs Abs: 2.5 10*3/uL (ref 0.7–4.0)
MCH: 29.7 pg (ref 26.0–34.0)
MCHC: 32.9 g/dL (ref 30.0–36.0)
MCV: 90.5 fL (ref 80.0–100.0)
Monocytes Absolute: 0.7 10*3/uL (ref 0.1–1.0)
Monocytes Relative: 8 %
Neutro Abs: 4.4 10*3/uL (ref 1.7–7.7)
Neutrophils Relative %: 56 %
Platelets: 337 10*3/uL (ref 150–400)
RBC: 3.9 MIL/uL (ref 3.87–5.11)
RDW: 12.7 % (ref 11.5–15.5)
WBC: 7.9 10*3/uL (ref 4.0–10.5)
nRBC: 0 % (ref 0.0–0.2)

## 2021-07-08 LAB — RAPID URINE DRUG SCREEN, HOSP PERFORMED
Amphetamines: NOT DETECTED
Barbiturates: NOT DETECTED
Benzodiazepines: NOT DETECTED
Cocaine: POSITIVE — AB
Opiates: NOT DETECTED
Tetrahydrocannabinol: NOT DETECTED

## 2021-07-08 LAB — ACETAMINOPHEN LEVEL: Acetaminophen (Tylenol), Serum: <10 ug/mL — ABNORMAL LOW (ref 10–30)

## 2021-07-08 LAB — ETHANOL: Alcohol, Ethyl (B): <10 mg/dL (ref <10)

## 2021-07-08 LAB — I-STAT BETA HCG BLOOD, ED (MC, WL, AP ONLY): I-stat hCG, quantitative: <5 m[IU]/mL (ref <5)

## 2021-07-08 LAB — SALICYLATE LEVEL: Salicylate Lvl: <7 mg/dL — ABNORMAL LOW (ref 7.0–30.0)

## 2021-07-08 MED ORDER — TRAZODONE HCL 50 MG PO TABS
150.0000 mg | ORAL_TABLET | Freq: Every evening | ORAL | Status: DC | PRN
  Administered 2021-07-08: 150 mg via ORAL
  Filled 2021-07-08: qty 1

## 2021-07-08 MED ORDER — ACETAMINOPHEN 325 MG PO TABS
650.0000 mg | ORAL_TABLET | ORAL | Status: DC | PRN
  Administered 2021-07-08: 650 mg via ORAL
  Filled 2021-07-08: qty 2
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
## 2021-07-08 NOTE — ED Notes (Signed)
[place] Counselor at bedside via tele psych TTS ?
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
## 2021-07-08 NOTE — ED Notes (Signed)
[facility] tomorrow after 0800 report number [phone] for report, accepting Dr. Myles Lipps ?

## 2021-07-08 NOTE — BH Assessment (Signed)
Per Myrlene Broker, RN pt is not ready to be assessed, clinician asked RN to let clinician know when the pt is able to be assessed. ? ? ?Vertell Novak, MS, [facility], [facility] ?Triage Specialist ?[phone] ? ?

## 2021-07-08 NOTE — ED Notes (Signed)
Pt currently laying on hospital stretcher facing away from the doorway talking aloud about her frustration and grief over the death of her son. "I wish I could kill her, she killed my son" unable to determine if pt is still talking to staff, as she had been talking to writer minutes prior, or if she is responding to internal stimuli.  ?

## 2021-07-08 NOTE — Progress Notes (Signed)
Patient has been faxed out per the request of Dr. Dwyane Dee. Patient meets [facility] inpatient criteria per Earleen Newport, NP. Patient has been faxed out to the following facilities:  ? ?[place]  [address] [place] [address] [phone]  ?[facility]  [address] [place] [address] [phone]  ?[place]  [address], [place] [address] [phone]  ?[place] [place] [address] [phone]  ?[place] [address] [place] [address] [phone]  ?[facility]  [address], [place] [address] [phone]  ?[facility]  [address], [place] [address] [phone]  ?[facility]  [address] [place] [address] [phone]  ?[place] [place] [address] [phone]  ?[facility]  [address], [place] [address] [phone]  ?[facility]  [address] [place] [address] [phone]  ?[facility]  [address] [place] [address] [phone]  ? ?Mariea Clonts, MSW, LCSW-A  ?1:50 PM 07/08/2021   ?

## 2021-07-08 NOTE — BH Assessment (Signed)
Clinician message William Hamburger. Munnett, RN: "Hey. It's Trey with TTS. Is the pt able to engage in the assessment, if so the pt will need to be placed in a private room. Also is the pt under IVC?"  ? ? ?Clinician awaiting response.  ? ? ?Vertell Novak, MS, [facility], CRC ?Triage Specialist ?[phone] ? ? ?

## 2021-07-08 NOTE — ED Provider Triage Note (Signed)
Emergency Medicine Provider Triage Evaluation Note ? ?Kyrene Longan , a 56 y.o. female  was evaluated in triage.  Pt complains of suicidal ideation with plan to overdose.  She is currently residing at [facility], attempting to stay clean from cocaine.  States she got some stressful news yesterday that her son is going to be incarcerated for murder and that she relapsed with cocaine use today.  She states she was sent here by her facility due to her suicidality and is requesting admission.  She denies HI, AVH at this time.. ? ?Review of Systems  ?Positive: Suicidality ?Negative: HI, AVH ? ?Physical Exam  ?BP (!) 169/98 (BP Location: Right Arm)   Pulse 99   Temp 98.6 ?F (37 ?C) (Oral)   Resp 16   SpO2 100%  ?Gen:   Awake, tearful ?Resp:  Normal effort  ?MSK:   Moves extremities without difficulty  ?Other:  Tearful, anxious affect, does not appear to be responding to internal stimuli.  Cardiopulmonary and abdominal exams are benign. ? ?Medical Decision Making  ?Medically screening exam initiated at 12:12 AM.  Appropriate orders placed.  Aamirah Salmi was informed that the remainder of the evaluation will be completed by another provider, this initial triage assessment does not replace that evaluation, and the importance of remaining in the ED until their evaluation is complete. ? ?This chart was dictated using voice recognition software, Dragon. Despite the best efforts of this provider to proofread and correct errors, errors may still occur which can change documentation meaning. ? ?  ?Emeline Darling, PA-C ?07/08/21 0020 ? ?

## 2021-07-08 NOTE — ED Notes (Signed)
Patient endorses SI with PA.  She states that she will OD on meds if she is not seen.   ?

## 2021-07-08 NOTE — ED Provider Notes (Signed)
?[facility] ?Provider Note ? ? ?CSN: [id number] ?Arrival date & time: 07/07/21  2325 ? ?  ? ?History ? ?Chief Complaint  ?Patient presents with  ? Medical Clearance  ? ? ?Joanne Gonzalez is a 56 y.o. female who presents from [place] with concern for relapse of cocaine use with suicidality this evening.  States that her son died in 2023/02/10 and she found out today that her other son will be going to prison after committing serious crime.  She states that she relapsed and given her threat of suicidal attempt by overdose to her facility she was brought into the emergency department. ? ?I personally reviewed this patient's medical record she has history of polysubstance abuse, CVA, schizophrenia, and depression.  At this time she denies HI or AVH. ? ?HPI ? ?  ? ?Home Medications ?Prior to Admission medications   ?Medication Sig Start Date End Date Taking? Authorizing Provider  ?Accu-Chek Softclix Lancets lancets Use to check blood sugar once daily. R73.03 04/24/21   Ladell Pier, MD  ?benztropine (COGENTIN) 0.5 MG tablet Take 1 tablet (0.5 mg total) by mouth 2 (two) times daily as needed for tremors (EPS). 04/22/21   Freida Busman, MD  ?Blood Glucose Monitoring Suppl (ACCU-CHEK GUIDE) w/Device KIT Use to check blood sugar once daily. R73.03 04/24/21   Ladell Pier, MD  ?gabapentin (NEURONTIN) 100 MG capsule Take 2 capsules (200 mg total) by mouth 3 (three) times daily. 04/22/21   Freida Busman, MD  ?glucose blood (ACCU-CHEK GUIDE) test strip Use to check blood sugar once daily. R73.03 04/24/21   Ladell Pier, MD  ?megestrol (MEGACE) 40 MG tablet Take 1 tablet (40 mg total) by mouth 2 (two) times daily. 07/06/21 08/05/21  Ladell Pier, MD  ?metFORMIN (GLUCOPHAGE) 500 MG tablet TAKE ONE TABLET BY MOUTH DAILY WITH BREAKFAST 07/01/21   Charlott Rakes, MD  ?mirtazapine (REMERON) 30 MG tablet Take 1 tablet (30 mg total) by mouth at bedtime. 04/22/21   Freida Busman, MD  ?naproxen (NAPROSYN) 500 MG tablet Take 1 tablet (500 mg total) by mouth 2 (two) times daily as needed for moderate pain. 06/10/21   Petrucelli, Samantha R, PA-C  ?nicotine (NICODERM CQ - DOSED IN MG/24 HR) 7 mg/24hr patch Place 1 patch (7 mg total) onto the skin daily as needed (smoking cessation). 04/22/21   Freida Busman, MD  ?nitrofurantoin, macrocrystal-monohydrate, (MACROBID) 100 MG capsule Take 1 capsule (100 mg total) by mouth every 12 (twelve) hours. 04/22/21   Freida Busman, MD  ?omeprazole (PRILOSEC) 20 MG capsule Take 1 capsule (20 mg total) by mouth daily. ?Patient taking differently: Take 20 mg by mouth daily as needed (for heartburn or reflux). 03/24/21   Mayers, Cari S, PA-C  ?QUEtiapine 150 MG TABS Take 150 mg by mouth at bedtime. 03/09/21   Mayers, Cari S, PA-C  ?risperiDONE (RISPERDAL) 1 MG tablet Take 1 tablet (1 mg total) by mouth 2 (two) times daily. 04/22/21   Freida Busman, MD  ?traZODone (DESYREL) 150 MG tablet Take 1 tablet (150 mg total) by mouth at bedtime as needed for sleep. 04/24/21   Charlott Rakes, MD  ?Vitamin D, Ergocalciferol, (DRISDOL) 1.25 MG (50000 UNIT) CAPS capsule Take 1 capsule (50,000 Units total) by mouth every 7 (seven) days. ?Patient taking differently: Take 50,000 Units by mouth every Tuesday. 03/10/21   Mayers, Cari S, PA-C  ?sertraline (ZOLOFT) 50 MG tablet Take 1 tablet (50 mg total)  by mouth daily. ?Patient not taking: Reported on 03/14/2015 09/02/14 03/14/15  Luan Moore, MD  ?   ? ?Allergies    ?Keflex [cephalexin], Penicillins, Bactrim [sulfamethoxazole-trimethoprim], and Lactose intolerance (gi)   ? ?Review of Systems   ?Review of Systems  ?Psychiatric/Behavioral:  Positive for suicidal ideas.   ? ?Physical Exam ?Updated Vital Signs ?BP (!) 169/98 (BP Location: Right Arm)   Pulse 99   Temp 98.6 ?F (37 ?C) (Oral)   Resp 16   SpO2 100%  ?Physical Exam ?Vitals and nursing note reviewed.  ?Constitutional:   ?   Appearance: She is not ill-appearing or  toxic-appearing.  ?HENT:  ?   Head: Normocephalic and atraumatic.  ?   Mouth/Throat:  ?   Mouth: Mucous membranes are moist.  ?   Pharynx: No oropharyngeal exudate or posterior oropharyngeal erythema.  ?Eyes:  ?   General:     ?   Right eye: No discharge.     ?   Left eye: No discharge.  ?   Extraocular Movements: Extraocular movements intact.  ?   Conjunctiva/sclera: Conjunctivae normal.  ?   Pupils: Pupils are equal, round, and reactive to light.  ?Cardiovascular:  ?   Rate and Rhythm: Normal rate and regular rhythm.  ?   Pulses: Normal pulses.  ?   Heart sounds: Normal heart sounds. No murmur heard. ?Pulmonary:  ?   Effort: Pulmonary effort is normal. No respiratory distress.  ?   Breath sounds: Normal breath sounds. No wheezing or rales.  ?Abdominal:  ?   General: Bowel sounds are normal. There is no distension.  ?   Palpations: Abdomen is soft.  ?   Tenderness: There is no abdominal tenderness. There is no right CVA tenderness, left CVA tenderness, guarding or rebound.  ?Musculoskeletal:     ?   General: No deformity.  ?   Cervical back: Neck supple.  ?   Right lower leg: No edema.  ?   Left lower leg: No edema.  ?Skin: ?   General: Skin is warm and dry.  ?   Capillary Refill: Capillary refill takes less than 2 seconds.  ?Neurological:  ?   General: No focal deficit present.  ?   Mental Status: She is alert and oriented to person, place, and time. Mental status is at baseline.  ?Psychiatric:     ?   Mood and Affect: Mood normal. Affect is tearful.     ?   Thought Content: Thought content includes suicidal ideation. Thought content does not include homicidal ideation. Thought content includes suicidal plan.  ? ? ?ED Results / Procedures / Treatments   ?Labs ?(all labs ordered are listed, but only abnormal results are displayed) ?Labs Reviewed  ?COMPREHENSIVE METABOLIC PANEL - Abnormal; Notable for the following components:  ?    Result Value  ? Potassium 3.4 (*)   ? CO2 20 (*)   ? All other components within  normal limits  ?CBC WITH DIFFERENTIAL/PLATELET - Abnormal; Notable for the following components:  ? Hemoglobin 11.6 (*)   ? HCT 35.3 (*)   ? All other components within normal limits  ?ACETAMINOPHEN LEVEL - Abnormal; Notable for the following components:  ? Acetaminophen (Tylenol), Serum <10 (*)   ? All other components within normal limits  ?SALICYLATE LEVEL - Abnormal; Notable for the following components:  ? Salicylate Lvl <2.4 (*)   ? All other components within normal limits  ?RESP PANEL BY RT-PCR (FLU A&B, COVID) ARPGX2  ?  ETHANOL  ?RAPID URINE DRUG SCREEN, HOSP PERFORMED  ?I-STAT BETA HCG BLOOD, ED ([facility], [facility], [facility] ONLY)  ? ? ?EKG ?None ? ?Radiology ?No results found. ? ?Procedures ?Procedures  ? ? ?Medications Ordered in ED ?Medications - No data to display ? ?ED Course/ Medical Decision Making/ A&P ?  ?                        ?Medical Decision Making ?56 year old female presents with concern for suicidality. ? ?hypertensive on intake, vital signs otherwise normal.  Cardiopulmonary exam is normal, abdominal Sam is benign.  Patient does not appear to be responding to internal stimuli though she has tearful, labile affect and endorses suicidality. ? ?Amount and/or Complexity of Data Reviewed ?Labs: ordered. ?   Details: CBC without leukocystosis, mild anemia.  CMP with mild hypokalemia of 3.4, RVP negative.  Ingestion labs negative.  Urine studies pending at this time. ? ? ?Medical clearance labs ordered and pending, patient medically cleared at this time. Psych recommending inpatient treatment. Patient under psychiatric hold.  ? ?This chart was dictated using voice recognition software, Dragon. Despite the best efforts of this provider to proofread and correct errors, errors may still occur which can change documentation meaning. ? ?Final Clinical Impression(s) / ED Diagnoses ?Final diagnoses:  ?None  ? ? ?Rx / DC Orders ?ED Discharge Orders   ? ? None  ? ?  ? ? ?  ?Emeline Darling, PA-C ?07/08/21 8948 ? ?   ?Merryl Hacker, MD ?07/08/21 [phone] ? ?

## 2021-07-08 NOTE — ED Notes (Signed)
TTS in process 

## 2021-07-09 NOTE — ED Notes (Signed)
This Probation officer is calling [place] to give report ?

## 2021-07-09 NOTE — ED Notes (Signed)
Report accepted ?

## 2021-07-09 NOTE — ED Provider Notes (Addendum)
Emergency Medicine Observation Re-evaluation Note ? ?Joanne Gonzalez is a 56 y.o. female, seen on rounds today.  Pt initially presented to the ED for complaints of Medical Clearance ?Currently, the patient is resting comfortably.  ? ?Physical Exam  ?BP 113/74   Pulse 88   Temp 99 ?F (37.2 ?C)   Resp 18   SpO2 99%  ?Physical Exam ?Vitals and nursing note reviewed.  ?Constitutional:   ?   Appearance: Normal appearance.  ?Pulmonary:  ?   Effort: Pulmonary effort is normal.  ?Neurological:  ?   Mental Status: She is alert.  ?   GCS: GCS eye subscore is 4. GCS verbal subscore is 5. GCS motor subscore is 6.  ?Psychiatric:     ?   Attention and Perception: Attention and perception normal.     ?   Mood and Affect: Mood and affect normal.     ?   Speech: Speech normal.     ?   Behavior: Behavior normal. Behavior is cooperative.     ?   Thought Content: Thought content includes suicidal ideation.  ? ? ? ?ED Course / MDM  ?EKG:EKG Interpretation ? ?Date/Time:  Wednesday July 08 2021 08:20:58 EDT ?Ventricular Rate:  87 ?PR Interval:  138 ?QRS Duration: 84 ?QT Interval:  374 ?QTC Calculation: 450 ?R Axis:   13 ?Text Interpretation: Normal sinus rhythm Minimal voltage criteria for LVH, may be normal variant ( R in aVL ) When compared with ECG of 09-Jun-2021 19:54, PREVIOUS ECG IS PRESENT Confirmed by Blanchie Dessert [phone]) on 07/08/2021 11:16:53 AM ? ?I have reviewed the labs performed to date as well as medications administered while in observation.  Recent changes in the last 24 hours include waiting for placement. ? ?Plan  ?Patient sent via [facility] Transport to [facility]. Accepting physician Dr. Myles Lipps.  ? ? Joanne Gonzalez is not under involuntary commitment. ? ? ?  ?Joanne Cure, DO ?92/42/68 0911 ? ?  ?Joanne Cure, DO ?34/19/62 1153 ? ?

## 2021-09-16 ENCOUNTER — Other Ambulatory Visit: Payer: Self-pay

## 2021-09-16 ENCOUNTER — Encounter (HOSPITAL_COMMUNITY): Payer: Self-pay

## 2021-09-16 ENCOUNTER — Emergency Department (HOSPITAL_COMMUNITY)
Admission: EM | Admit: 2021-09-16 | Discharge: 2021-09-16 | Disposition: A | Discharge status: Home / Self Care | Payer: Medicaid Other | Attending: Emergency Medicine | Admitting: Emergency Medicine

## 2021-09-16 DIAGNOSIS — R197 Diarrhea, unspecified: Secondary | ICD-10-CM | POA: Insufficient documentation

## 2021-09-16 DIAGNOSIS — R112 Nausea with vomiting, unspecified: Secondary | ICD-10-CM | POA: Insufficient documentation

## 2021-09-16 DIAGNOSIS — N9489 Other specified conditions associated with female genital organs and menstrual cycle: Secondary | ICD-10-CM | POA: Diagnosis not present

## 2021-09-16 LAB — COMPREHENSIVE METABOLIC PANEL
ALT: 18 U/L (ref 0–44)
AST: 44 U/L — ABNORMAL HIGH (ref 15–41)
Albumin: 4 g/dL (ref 3.5–5.0)
Alkaline Phosphatase: 66 U/L (ref 38–126)
Anion gap: 8 (ref 5–15)
BUN: 12 mg/dL (ref 6–20)
CO2: 22 mmol/L (ref 22–32)
Calcium: 9.2 mg/dL (ref 8.9–10.3)
Chloride: 110 mmol/L (ref 98–111)
Creatinine, Ser: 0.86 mg/dL (ref 0.44–1.00)
GFR, Estimated: >60 mL/min (ref >60)
Glucose, Bld: 115 mg/dL — ABNORMAL HIGH (ref 70–99)
Potassium: 4.7 mmol/L (ref 3.5–5.1)
Sodium: 140 mmol/L (ref 135–145)
Total Bilirubin: 0.8 mg/dL (ref 0.3–1.2)
Total Protein: 8 g/dL (ref 6.5–8.1)

## 2021-09-16 LAB — I-STAT BETA HCG BLOOD, ED (MC, WL, AP ONLY): I-stat hCG, quantitative: <5 m[IU]/mL (ref <5)

## 2021-09-16 LAB — CBC WITH DIFFERENTIAL/PLATELET
Abs Immature Granulocytes: 0.02 10*3/uL (ref 0.00–0.07)
Basophils Absolute: 0.1 10*3/uL (ref 0.0–0.1)
Basophils Relative: 1 %
Eosinophils Absolute: 0.2 10*3/uL (ref 0.0–0.5)
Eosinophils Relative: 3 %
HCT: 37.3 % (ref 36.0–46.0)
Hemoglobin: 11.9 g/dL — ABNORMAL LOW (ref 12.0–15.0)
Immature Granulocytes: 0 %
Lymphocytes Relative: 26 %
Lymphs Abs: 1.7 10*3/uL (ref 0.7–4.0)
MCH: 28.3 pg (ref 26.0–34.0)
MCHC: 31.9 g/dL (ref 30.0–36.0)
MCV: 88.6 fL (ref 80.0–100.0)
Monocytes Absolute: 0.7 10*3/uL (ref 0.1–1.0)
Monocytes Relative: 10 %
Neutro Abs: 3.9 10*3/uL (ref 1.7–7.7)
Neutrophils Relative %: 60 %
Platelets: 375 10*3/uL (ref 150–400)
RBC: 4.21 MIL/uL (ref 3.87–5.11)
RDW: 16.8 % — ABNORMAL HIGH (ref 11.5–15.5)
WBC: 6.6 10*3/uL (ref 4.0–10.5)
nRBC: 0 % (ref 0.0–0.2)

## 2021-09-16 LAB — LIPASE, BLOOD: Lipase: 27 U/L (ref 11–51)

## 2021-09-16 MED ORDER — SODIUM CHLORIDE 0.9 % IV BOLUS
1000.0000 mL | Freq: Once | INTRAVENOUS | Status: AC
  Administered 2021-09-16: 1000 mL via INTRAVENOUS

## 2021-09-16 MED ORDER — ONDANSETRON 4 MG PO TBDP: 4.0000 mg | ORAL_TABLET | Freq: Three times a day (TID) | ORAL | 0 refills | Status: DC | PRN

## 2021-09-16 MED ORDER — ONDANSETRON HCL 4 MG/2ML IJ SOLN
4.0000 mg | Freq: Once | INTRAMUSCULAR | Status: AC
  Administered 2021-09-16: 4 mg via INTRAVENOUS
  Filled 2021-09-16: qty 2

## 2021-09-16 NOTE — ED Provider Notes (Signed)
[facility] Emergency Department Provider Note MRN:  [id number]  Alex date & time: 09/16/21     Chief Complaint   Abdominal Pain   History of Present Illness   Joanne Gonzalez is a 56 y.o. year-old female presents to the ED with chief complaint of nausea, vomiting, and diarrhea.  Onset yesterday.  Denies any fever.  States that her abdomen feels crampy from vomiting.  Denies dysuria.  Denies any other associated symptoms.  States she feels dehydrated.  History provided by patient.   Review of Systems  Pertinent review of systems noted in HPI.    Physical Exam   Vitals:   09/16/21 0330 09/16/21 0345  BP: (!) 160/73 139/88  Pulse: 63 (!) 55  Resp:  20  Temp:    SpO2: 99% 97%    CONSTITUTIONAL:  non toxic-appearing, NAD NEURO:  Alert and oriented x 3, CN 3-12 grossly intact EYES:  eyes equal and reactive ENT/NECK:  Supple, no stridor  CARDIO:  normal rate, appears well-perfused  PULM:  No respiratory distress,  GI/GU:  non-distended, no focal abdominal tenderness MSK/SPINE:  No gross deformities, no edema, moves all extremities  SKIN:  no rash, atraumatic   *Additional and/or pertinent findings included in MDM below  Diagnostic and Interventional Summary    EKG Interpretation  Date/Time:    Ventricular Rate:    PR Interval:    QRS Duration:   QT Interval:    QTC Calculation:   R Axis:     Text Interpretation:         Labs Reviewed  COMPREHENSIVE METABOLIC PANEL - Abnormal; Notable for the following components:      Result Value   Glucose, Bld 115 (*)    AST 44 (*)    All other components within normal limits  CBC WITH DIFFERENTIAL/PLATELET - Abnormal; Notable for the following components:   Hemoglobin 11.9 (*)    RDW 16.8 (*)    All other components within normal limits  LIPASE, BLOOD  I-STAT BETA HCG BLOOD, ED ([facility], [facility], [facility] ONLY)    No orders to display    Medications  sodium chloride 0.9 % bolus 1,000 mL (1,000 mLs  Intravenous New Bag/Given 09/16/21 0143)  ondansetron (ZOFRAN) injection 4 mg (4 mg Intravenous Given 09/16/21 0143)     Procedures  /  Critical Care Procedures  ED Course and Medical Decision Making  I have reviewed the triage vital signs, the nursing notes, and pertinent available records from the EMR.  Social Determinants Affecting Complexity of Care: Patient has no clinically significant social determinants affecting this chief complaint..   ED Course:   Patient here with nausea, vomiting, and diarrhea.  Top differential diagnoses include gastro, pancreatitis, obstruction. Medical Decision Making Patient here with nausea, vomiting, diarrhea that started yesterday.  States that she feels dehydrated.  Will check labs and give fluids.  3:51 AM Patient feels improved after fluids and medication in ED.  Tolerating p.o.  Will discharge with PCP follow-up.  Return precautions discussed.    Problems Addressed: Nausea vomiting and diarrhea: acute illness or injury  Amount and/or Complexity of Data Reviewed Labs: ordered.    Details: Labs reassuring, pregnancy test negative, no significant electrolyte derangement, no leukocytosis, lipase is normal  Risk Prescription drug management.     Consultants: No consultations were needed in caring for this patient.   Treatment and Plan: Emergency department workup does not suggest an emergent condition requiring admission or immediate intervention beyond  what  has been performed at this time. The patient is safe for discharge and has  been instructed to return immediately for worsening symptoms, change in  symptoms or any other concerns    Final Clinical Impressions(s) / ED Diagnoses     ICD-10-CM   1. Nausea vomiting and diarrhea  R11.2    R19.7       ED Discharge Orders          Ordered    ondansetron (ZOFRAN-ODT) 4 MG disintegrating tablet  Every 8 hours PRN        09/16/21 0349              Discharge Instructions  Discussed with and Provided to Patient:     Discharge Instructions      Return for new or worsening symptoms.  Take medications as prescribed.       Montine Circle, PA-C 09/16/21 0351    Shanon Rosser, MD 09/16/21 [phone]

## 2021-09-16 NOTE — ED Notes (Signed)
Provided sprite and graham crackers

## 2021-09-16 NOTE — ED Triage Notes (Signed)
BIBA for epigastric abd pain with n/v x1 day. Denies tenderness. Refused all treatment with EMS

## 2021-09-16 NOTE — Discharge Instructions (Addendum)
Return for new or worsening symptoms.  Take medications as prescribed.

## 2021-10-01 ENCOUNTER — Other Ambulatory Visit: Payer: Self-pay

## 2021-10-01 ENCOUNTER — Emergency Department (HOSPITAL_COMMUNITY)
Admission: EM | Admit: 2021-10-01 | Discharge: 2021-10-01 | Disposition: A | Discharge status: Home / Self Care | Payer: Medicaid Other | Source: Home / Self Care

## 2021-10-03 ENCOUNTER — Other Ambulatory Visit: Payer: Self-pay

## 2021-10-03 ENCOUNTER — Emergency Department (HOSPITAL_COMMUNITY)
Admission: EM | Admit: 2021-10-03 | Discharge: 2021-10-03 | Discharge status: Against Medical Advice | Payer: Medicaid Other

## 2021-10-03 NOTE — ED Notes (Signed)
Pt walked out of triage room advising she was going to get her keys out of her car. Pt never showed back up to lobby. Will try to call her name one more time.

## 2021-10-06 IMAGING — CT CT HEAD CODE STROKE
4 series · 16 of 47 positions shown, 18 images · non-contrast
Comparison: Brain MRI 02/11/2019, CT angiogram head/neck
02/10/2019, noncontrast head CT 08/30/2014

CLINICAL DATA: Code stroke. Focal neuro deficit, greater than 6
hours, stroke suspected. Additional history: Slurred speech,
left-sided facial droop and blacked.

EXAM:
CT HEAD WITHOUT CONTRAST
TECHNIQUE: Contiguous axial images were obtained from the base of the skull
through the vertex without intravenous contrast.

[Series 3: head wo · axial · 0.45mm/px · z∈[+1282,+1402]mm · 7 of 32 slices shown, 9 images]
[im 4/32  brain]
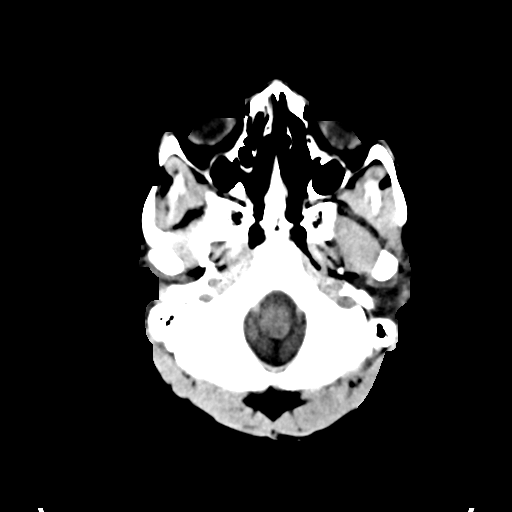
[im 4/32  bone]
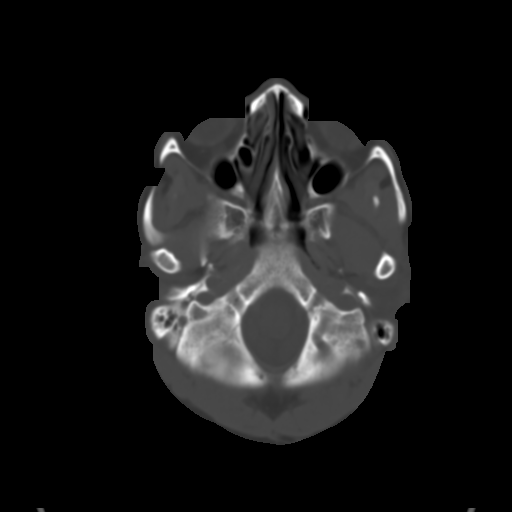
[im 8/32  brain]
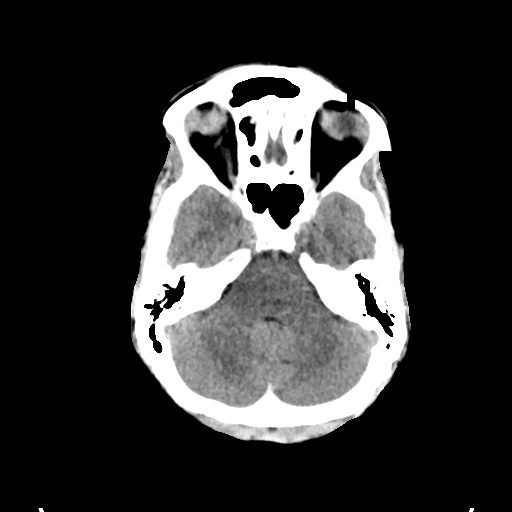
[im 12/32  brain]
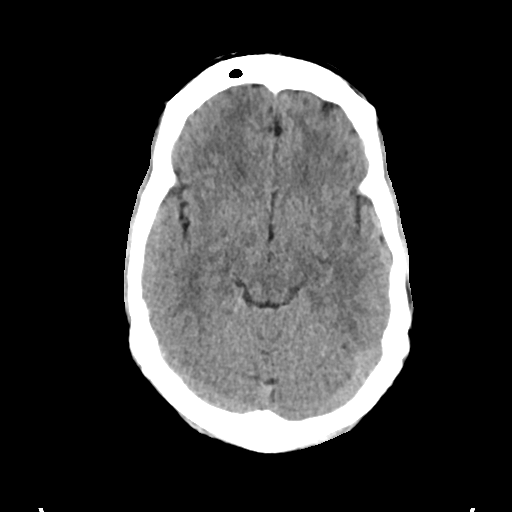
[im 16/32  brain]
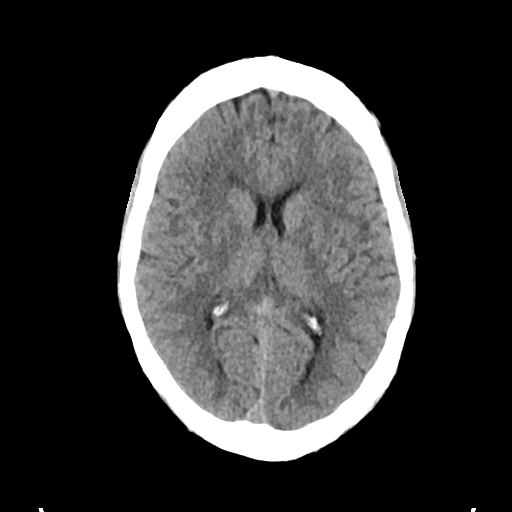
[im 20/32  brain]
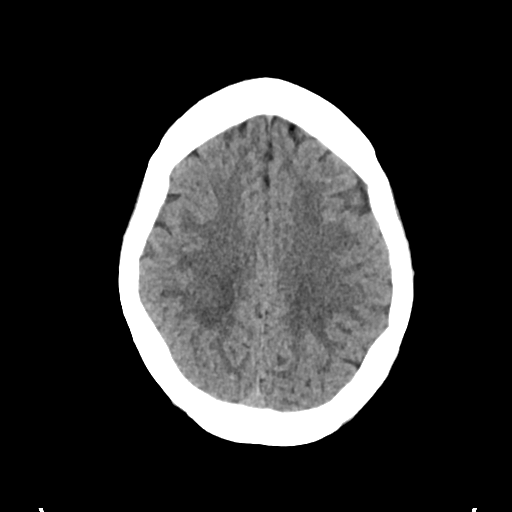
[im 20/32  bone]
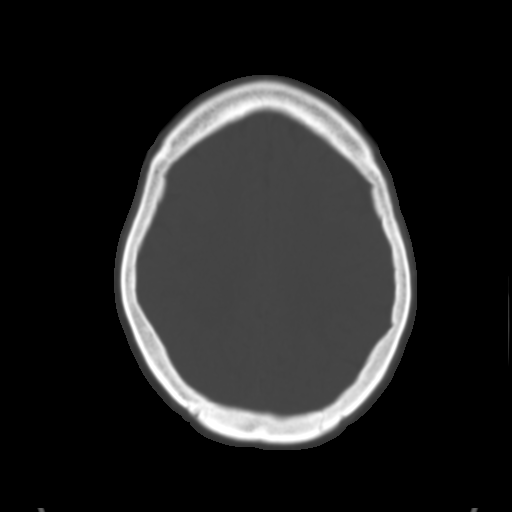
[im 24/32  brain]
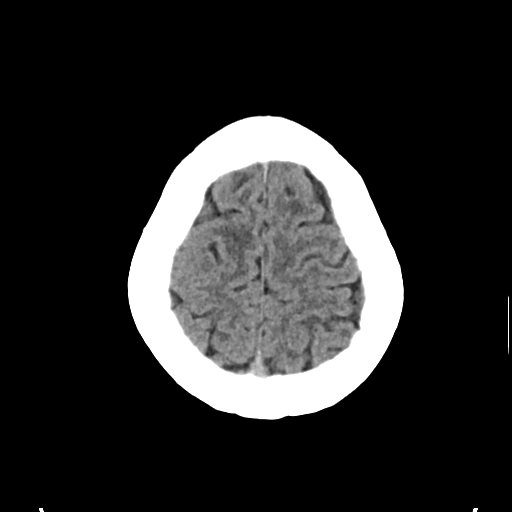
[im 28/32  brain]
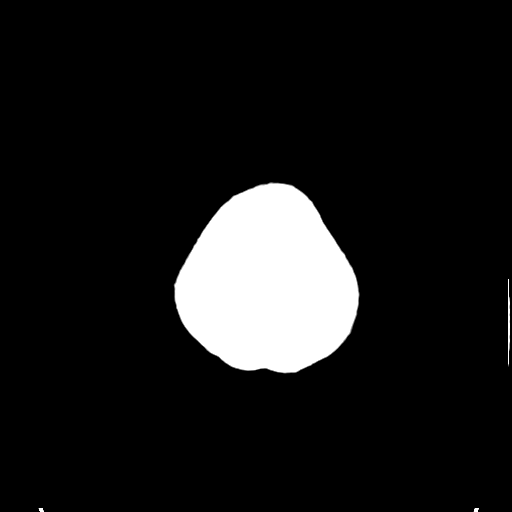

[Series 4: head bone · axial · 0.45mm/px · z∈[+1281,+1313]mm · 3 of 79 slices shown]
[im 8/79  bone]
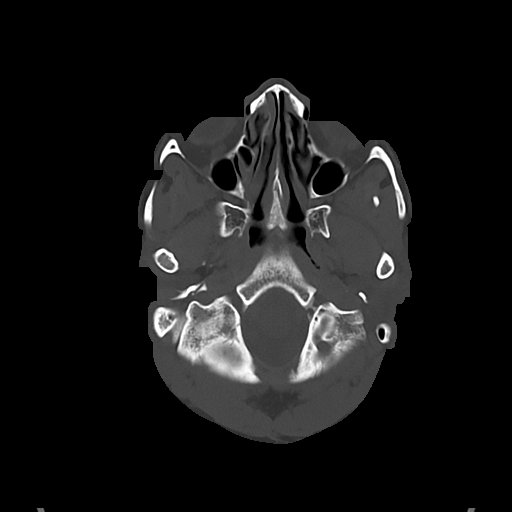
[im 16/79  bone]
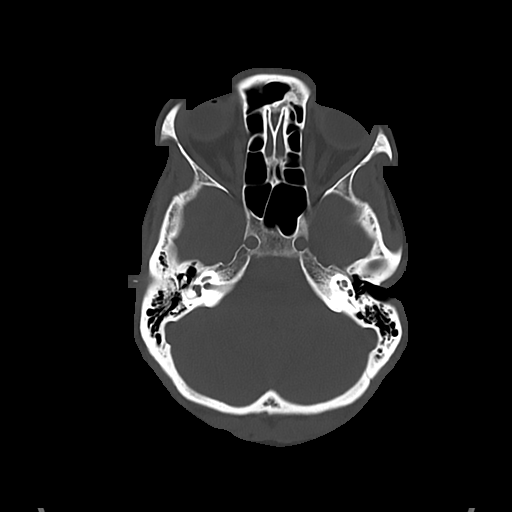
[im 24/79  bone]
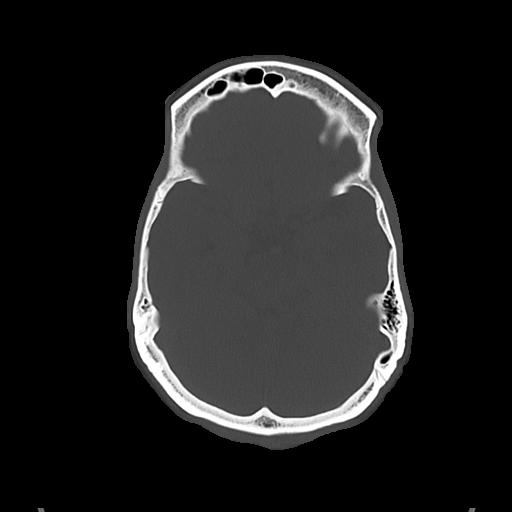

[Series 5: cor soft · coronal · 0.31mm/px · 3 of 66 slices shown]
[im 22/66  brain]
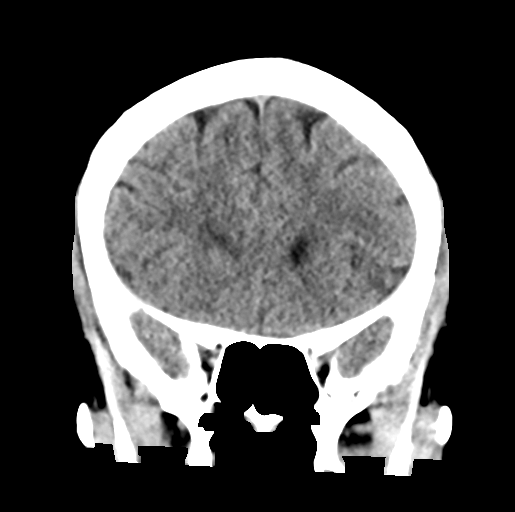
[im 29/66  brain]
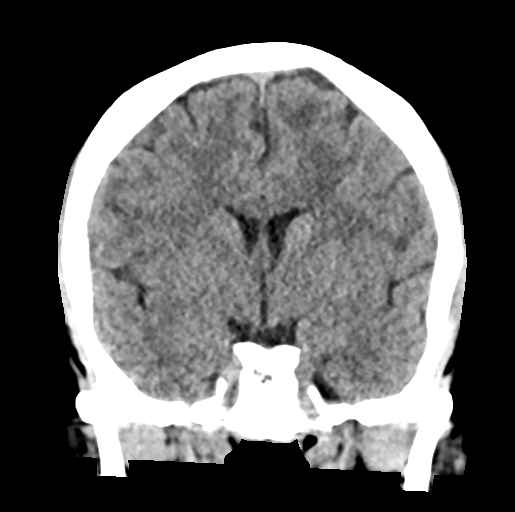
[im 37/66  brain]
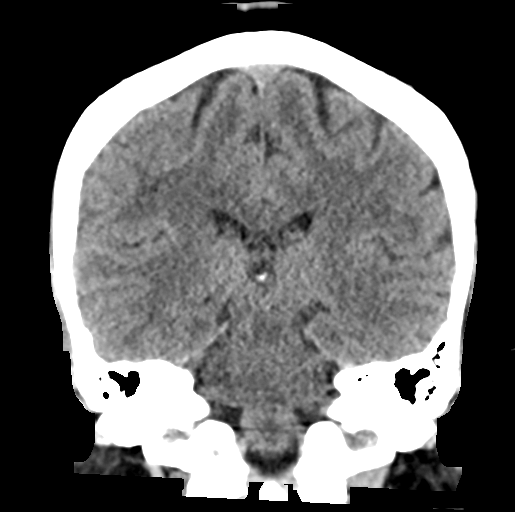

[Series 6: sag soft · sagittal · 0.31mm/px · 3 of 55 slices shown]
[im 19/55  brain]
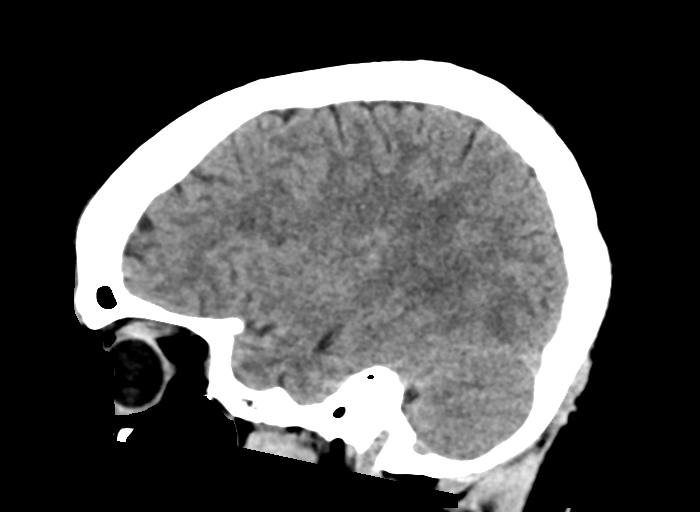
[im 28/55  brain]
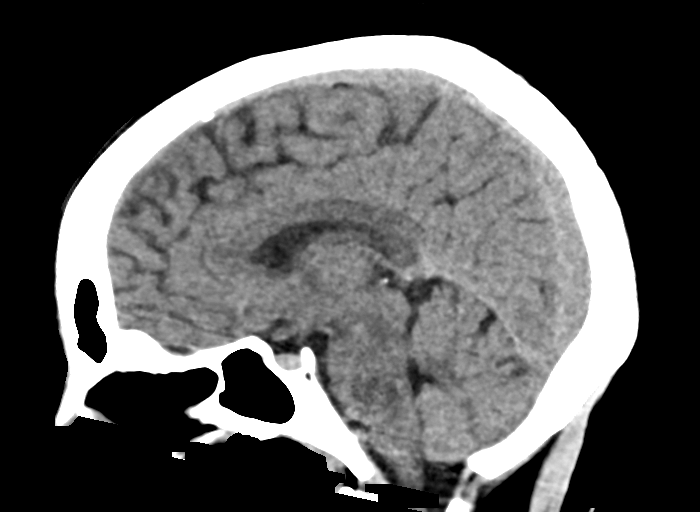
[im 37/55  brain]
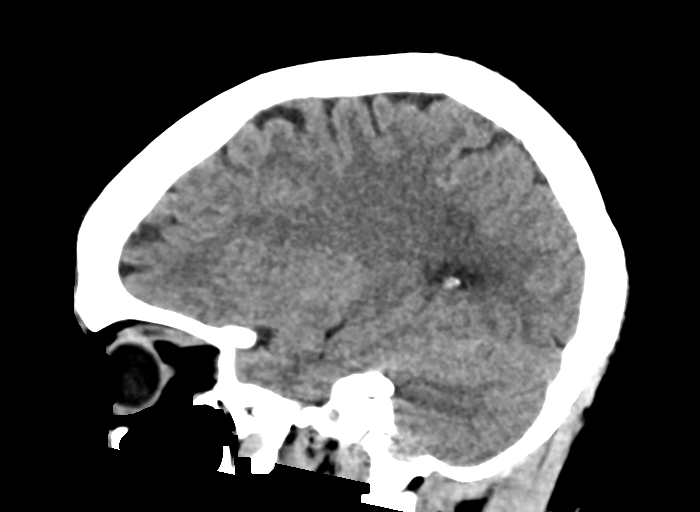

[16 of 47 positions shown; findings below may reference images not displayed]

FINDINGS: Brain:

No evidence of acute intracranial hemorrhage.

No demarcated cortical infarction.

No evidence of intracranial mass.

No midline shift or extra-axial fluid collection.

Mild patchy hypoattenuation within the cerebral white matter is
nonspecific, but consistent with chronic small vessel ischemic
disease and similar to prior examination.

Cerebral volume is normal for age.

Vascular: No hyperdense vessel.

Skull: Normal. Negative for fracture or focal lesion.

Sinuses/Orbits: Visualized orbits demonstrate no acute abnormality.

Other: No significant paranasal sinus disease at the imaged levels.

These results were communicated to Dr. Md. Mahbub At [DATE] pmon
02/28/2019by text page via the [employer] messaging system.
IMPRESSION: 1. No evidence of acute intracranial hemorrhage or acute infarct.
2. Mild chronic small vessel ischemic disease is similar to prior
exam 02/10/2019.

## 2021-10-22 ENCOUNTER — Other Ambulatory Visit: Payer: Self-pay

## 2021-10-22 ENCOUNTER — Emergency Department (HOSPITAL_COMMUNITY)
Admission: EM | Admit: 2021-10-22 | Discharge: 2021-10-22 | Discharge status: Against Medical Advice | Payer: Medicaid Other

## 2021-10-22 NOTE — ED Notes (Signed)
Pt not responding to be triage

## 2021-11-28 ENCOUNTER — Emergency Department (HOSPITAL_COMMUNITY): Payer: Medicaid Other

## 2021-11-28 ENCOUNTER — Encounter (HOSPITAL_COMMUNITY): Payer: Self-pay

## 2021-11-28 ENCOUNTER — Other Ambulatory Visit: Payer: Self-pay

## 2021-11-28 ENCOUNTER — Emergency Department (HOSPITAL_COMMUNITY)
Admission: EM | Admit: 2021-11-28 | Discharge: 2021-11-28 | Disposition: A | Discharge status: Home / Self Care | Payer: Medicaid Other | Attending: Emergency Medicine | Admitting: Emergency Medicine

## 2021-11-28 DIAGNOSIS — Z79899 Other long term (current) drug therapy: Secondary | ICD-10-CM | POA: Diagnosis not present

## 2021-11-28 DIAGNOSIS — F1990 Other psychoactive substance use, unspecified, uncomplicated: Secondary | ICD-10-CM

## 2021-11-28 DIAGNOSIS — E876 Hypokalemia: Secondary | ICD-10-CM | POA: Insufficient documentation

## 2021-11-28 DIAGNOSIS — R531 Weakness: Secondary | ICD-10-CM | POA: Diagnosis not present

## 2021-11-28 DIAGNOSIS — F1490 Cocaine use, unspecified, uncomplicated: Secondary | ICD-10-CM | POA: Insufficient documentation

## 2021-11-28 DIAGNOSIS — R0789 Other chest pain: Secondary | ICD-10-CM | POA: Insufficient documentation

## 2021-11-28 LAB — COMPREHENSIVE METABOLIC PANEL
ALT: 20 U/L (ref 0–44)
AST: 27 U/L (ref 15–41)
Albumin: 4.2 g/dL (ref 3.5–5.0)
Alkaline Phosphatase: 80 U/L (ref 38–126)
Anion gap: 8 (ref 5–15)
BUN: 12 mg/dL (ref 6–20)
CO2: 22 mmol/L (ref 22–32)
Calcium: 9.4 mg/dL (ref 8.9–10.3)
Chloride: 110 mmol/L (ref 98–111)
Creatinine, Ser: 0.97 mg/dL (ref 0.44–1.00)
GFR, Estimated: >60 mL/min (ref >60)
Glucose, Bld: 169 mg/dL — ABNORMAL HIGH (ref 70–99)
Potassium: 2.8 mmol/L — ABNORMAL LOW (ref 3.5–5.1)
Sodium: 140 mmol/L (ref 135–145)
Total Bilirubin: 0.4 mg/dL (ref 0.3–1.2)
Total Protein: 8.2 g/dL — ABNORMAL HIGH (ref 6.5–8.1)

## 2021-11-28 LAB — TROPONIN I (HIGH SENSITIVITY)
Troponin I (High Sensitivity): 7 ng/L (ref <18)
Troponin I (High Sensitivity): 10 ng/L (ref <18)

## 2021-11-28 LAB — CBC WITH DIFFERENTIAL/PLATELET
Abs Immature Granulocytes: 0.02 10*3/uL (ref 0.00–0.07)
Basophils Absolute: 0.1 10*3/uL (ref 0.0–0.1)
Basophils Relative: 1 %
Eosinophils Absolute: 0.2 10*3/uL (ref 0.0–0.5)
Eosinophils Relative: 2 %
HCT: 40.6 % (ref 36.0–46.0)
Hemoglobin: 13.1 g/dL (ref 12.0–15.0)
Immature Granulocytes: 0 %
Lymphocytes Relative: 20 %
Lymphs Abs: 1.6 10*3/uL (ref 0.7–4.0)
MCH: 29.2 pg (ref 26.0–34.0)
MCHC: 32.3 g/dL (ref 30.0–36.0)
MCV: 90.4 fL (ref 80.0–100.0)
Monocytes Absolute: 0.7 10*3/uL (ref 0.1–1.0)
Monocytes Relative: 8 %
Neutro Abs: 5.7 10*3/uL (ref 1.7–7.7)
Neutrophils Relative %: 69 %
Platelets: 275 10*3/uL (ref 150–400)
RBC: 4.49 MIL/uL (ref 3.87–5.11)
RDW: 16 % — ABNORMAL HIGH (ref 11.5–15.5)
WBC: 8.2 10*3/uL (ref 4.0–10.5)
nRBC: 0 % (ref 0.0–0.2)

## 2021-11-28 LAB — ACETAMINOPHEN LEVEL: Acetaminophen (Tylenol), Serum: <10 ug/mL — ABNORMAL LOW (ref 10–30)

## 2021-11-28 LAB — ETHANOL: Alcohol, Ethyl (B): <10 mg/dL (ref <10)

## 2021-11-28 LAB — BLOOD GAS, VENOUS
Acid-base deficit: 2.7 mmol/L — ABNORMAL HIGH (ref 0.0–2.0)
Bicarbonate: 20.4 mmol/L (ref 20.0–28.0)
O2 Saturation: 92.2 %
Patient temperature: 37
pCO2, Ven: 30 mmHg — ABNORMAL LOW (ref 44–60)
pH, Ven: 7.44 — ABNORMAL HIGH (ref 7.25–7.43)
pO2, Ven: 60 mmHg — ABNORMAL HIGH (ref 32–45)

## 2021-11-28 LAB — SALICYLATE LEVEL: Salicylate Lvl: <7 mg/dL — ABNORMAL LOW (ref 7.0–30.0)

## 2021-11-28 MED ORDER — SODIUM CHLORIDE 0.9 % IV BOLUS
1000.0000 mL | Freq: Once | INTRAVENOUS | Status: AC
  Administered 2021-11-28: 1000 mL via INTRAVENOUS

## 2021-11-28 MED ORDER — POTASSIUM CHLORIDE CRYS ER 20 MEQ PO TBCR
60.0000 meq | EXTENDED_RELEASE_TABLET | Freq: Once | ORAL | Status: AC
  Administered 2021-11-28: 60 meq via ORAL
  Filled 2021-11-28: qty 3

## 2021-11-28 MED ORDER — ASPIRIN 325 MG PO TABS
325.0000 mg | ORAL_TABLET | Freq: Every day | ORAL | Status: DC
  Administered 2021-11-28: 325 mg via ORAL
  Filled 2021-11-28: qty 1

## 2021-11-28 MED ORDER — LORAZEPAM 2 MG/ML IJ SOLN
1.0000 mg | Freq: Once | INTRAMUSCULAR | Status: AC
  Administered 2021-11-28: 1 mg via INTRAVENOUS
  Filled 2021-11-28: qty 1

## 2021-11-28 NOTE — ED Provider Notes (Signed)
[facility] DEPT Provider Note   CSN: [id number] Arrival date & time: 11/28/21  1351     History  Chief Complaint  Patient presents with   Ingestion   Chest Pain    Joanne Gonzalez is a 56 y.o. female with a history of crack/cocaine use, left-sided hemiaplasia and weakness and cerebellar stroke presenting today with ingestion/chest pain.  Staff reports the patient was dropped off by an unknown individual and helped into the department.  Patient is complaining of chest tightness.  She does report that around 3am this morning she smoked something that she thought was crack but says "it was not, something was wrong with it."   Ingestion Associated symptoms include chest pain.  Chest Pain      Home Medications Prior to Admission medications   Medication Sig Start Date End Date Taking? Authorizing Provider  Accu-Chek Softclix Lancets lancets Use to check blood sugar once daily. R73.03 04/24/21   Ladell Pier, MD  ALEVE 220 MG tablet Take 220-440 mg by mouth 2 (two) times daily as needed (for mild pain or headaches).    [provider]  benztropine (COGENTIN) 0.5 MG tablet Take 1 tablet (0.5 mg total) by mouth 2 (two) times daily as needed for tremors (EPS). 04/22/21   Freida Busman, MD  Blood Glucose Monitoring Suppl (ACCU-CHEK GUIDE) w/Device KIT Use to check blood sugar once daily. R73.03 04/24/21   Ladell Pier, MD  gabapentin (NEURONTIN) 100 MG capsule Take 2 capsules (200 mg total) by mouth 3 (three) times daily. Patient not taking: Reported on 07/08/2021 04/22/21   Freida Busman, MD  gabapentin (NEURONTIN) 300 MG capsule Take 300 mg by mouth 3 (three) times daily.    [provider]  glucose blood (ACCU-CHEK GUIDE) test strip Use to check blood sugar once daily. R73.03 04/24/21   Ladell Pier, MD  metFORMIN (GLUCOPHAGE) 500 MG tablet TAKE ONE TABLET BY MOUTH DAILY WITH BREAKFAST Patient taking differently: Take 500 mg  by mouth daily with breakfast. 07/01/21   Charlott Rakes, MD  mirtazapine (REMERON) 30 MG tablet Take 1 tablet (30 mg total) by mouth at bedtime. 04/22/21   Freida Busman, MD  naproxen (NAPROSYN) 500 MG tablet Take 1 tablet (500 mg total) by mouth 2 (two) times daily as needed for moderate pain. Patient not taking: Reported on 07/08/2021 06/10/21   Petrucelli, Glynda Jaeger, PA-C  nicotine (NICODERM CQ - DOSED IN MG/24 HR) 7 mg/24hr patch Place 1 patch (7 mg total) onto the skin daily as needed (smoking cessation). Patient not taking: Reported on 07/08/2021 04/22/21   Freida Busman, MD  nitrofurantoin, macrocrystal-monohydrate, (MACROBID) 100 MG capsule Take 1 capsule (100 mg total) by mouth every 12 (twelve) hours. Patient not taking: Reported on 07/08/2021 04/22/21   Freida Busman, MD  omeprazole (PRILOSEC) 20 MG capsule Take 1 capsule (20 mg total) by mouth daily. Patient taking differently: Take 20 mg by mouth daily as needed (for heartburn or reflux). 03/24/21   Mayers, Cari S, PA-C  ondansetron (ZOFRAN-ODT) 4 MG disintegrating tablet Take 1 tablet (4 mg total) by mouth every 8 (eight) hours as needed for nausea or vomiting. 09/16/21   Montine Circle, PA-C  QUEtiapine (SEROQUEL) 100 MG tablet Take 100 mg by mouth at bedtime.    [provider]  QUEtiapine 150 MG TABS Take 150 mg by mouth at bedtime. Patient not taking: Reported on 07/08/2021 03/09/21   Mayers, Cari S, PA-C  risperiDONE (  RISPERDAL) 1 MG tablet Take 1 tablet (1 mg total) by mouth 2 (two) times daily. 04/22/21   Freida Busman, MD  traZODone (DESYREL) 150 MG tablet Take 1 tablet (150 mg total) by mouth at bedtime as needed for sleep. Patient taking differently: Take 150 mg by mouth at bedtime. 04/24/21   Charlott Rakes, MD  Vitamin D, Ergocalciferol, (DRISDOL) 1.25 MG (50000 UNIT) CAPS capsule Take 1 capsule (50,000 Units total) by mouth every 7 (seven) days. 03/10/21   Mayers, Cari S, PA-C  sertraline (ZOLOFT) 50 MG tablet  Take 1 tablet (50 mg total) by mouth daily. Patient not taking: Reported on 03/14/2015 09/02/14 03/14/15  Luan Moore, MD      Allergies    Keflex [cephalexin], Penicillins, Risperdal [risperidone], Bactrim [sulfamethoxazole-trimethoprim], and Lactose intolerance (gi)    Review of Systems   Review of Systems  Cardiovascular:  Positive for chest pain.    Physical Exam Updated Vital Signs BP 114/65   Pulse 96   Temp 98.5 F (36.9 C)   Resp (!) 25   SpO2 96%  Physical Exam Vitals and nursing note reviewed.  Constitutional:      General: She is not in acute distress.    Appearance: Normal appearance. She is not ill-appearing.  HENT:     Head: Normocephalic and atraumatic.  Eyes:     General: No scleral icterus.    Conjunctiva/sclera: Conjunctivae normal.  Cardiovascular:     Rate and Rhythm: Regular rhythm. Tachycardia present.  Pulmonary:     Effort: Pulmonary effort is normal. No respiratory distress.  Chest:     Chest wall: No tenderness.  Abdominal:     Palpations: Abdomen is soft.     Tenderness: There is no abdominal tenderness.  Skin:    Findings: No rash.  Neurological:     Mental Status: She is alert.     Comments: 3/5 strength to left dorsiflexion.  Decreased strength with left finger grip.  Cranial nerves II through XII grossly intact.  No facial droop or slurred speech.  Psychiatric:        Mood and Affect: Mood is anxious.     ED Results / Procedures / Treatments   Labs (all labs ordered are listed, but only abnormal results are displayed) Labs Reviewed  COMPREHENSIVE METABOLIC PANEL - Abnormal; Notable for the following components:      Result Value   Potassium 2.8 (*)    Glucose, Bld 169 (*)    Total Protein 8.2 (*)    All other components within normal limits  CBC WITH DIFFERENTIAL/PLATELET - Abnormal; Notable for the following components:   RDW 16.0 (*)    All other components within normal limits  SALICYLATE LEVEL - Abnormal; Notable for the  following components:   Salicylate Lvl <7.9 (*)    All other components within normal limits  ACETAMINOPHEN LEVEL - Abnormal; Notable for the following components:   Acetaminophen (Tylenol), Serum <10 (*)    All other components within normal limits  BLOOD GAS, VENOUS - Abnormal; Notable for the following components:   pH, Ven 7.44 (*)    pCO2, Ven 30 (*)    pO2, Ven 60 (*)    Acid-base deficit 2.7 (*)    All other components within normal limits  ETHANOL  RAPID URINE DRUG SCREEN, HOSP PERFORMED  TROPONIN I (HIGH SENSITIVITY)    EKG None  Radiology CT Head Wo Contrast  Result Date: 11/28/2021 CLINICAL DATA:  Trauma EXAM: CT HEAD WITHOUT CONTRAST  TECHNIQUE: Contiguous axial images were obtained from the base of the skull through the vertex without intravenous contrast. RADIATION DOSE REDUCTION: This exam was performed according to the departmental dose-optimization program which includes automated exposure control, adjustment of the mA and/or kV according to patient size and/or use of iterative reconstruction technique. COMPARISON:  07/24/2021 FINDINGS: Brain: No acute intracranial findings are seen. There are no signs of bleeding within the cranium. Ventricles are not dilated. There is no focal mass effect. There is decreased density in periventricular white matter. Vascular: Unremarkable. Skull: Unremarkable. Sinuses/Orbits: Unremarkable. Other: None. IMPRESSION: No acute intracranial findings are seen in noncontrast CT brain. Electronically Signed   By: Elmer Picker M.D.   On: 11/28/2021 14:50   DG Chest Portable 1 View  Result Date: 11/28/2021 CLINICAL DATA:  Chest pain. EXAM: PORTABLE CHEST 1 VIEW COMPARISON:  December 23, 2020. FINDINGS: The heart size and mediastinal contours are within normal limits. Both lungs are clear. The visualized skeletal structures are unremarkable. IMPRESSION: No active disease. Electronically Signed   By: Marijo Conception M.D.   On: 11/28/2021 14:24     Procedures Procedures   Medications Ordered in ED Medications  aspirin tablet 325 mg (325 mg Oral Given 11/28/21 1414)  sodium chloride 0.9 % bolus 1,000 mL (1,000 mLs Intravenous New Bag/Given 11/28/21 1414)  LORazepam (ATIVAN) injection 1 mg (1 mg Intravenous Given 11/28/21 1414)  potassium chloride SA (KLOR-CON M) CR tablet 60 mEq (60 mEq Oral Given 11/28/21 1515)    ED Course/ Medical Decision Making/ A&P                           Medical Decision Making Amount and/or Complexity of Data Reviewed Labs: ordered. Radiology: ordered.  Risk OTC drugs. Prescription drug management.   This is a 56 yr old female who presents to the ED for concern of CP. The emergent differential diagnosis of chest pain includes: Acute coronary syndrome, pericarditis, aortic dissection, pulmonary embolism, tension pneumothorax, and esophageal rupture.    Past Medical History / Co-morbidities / Social History: Substance use disorder, hypokalemia, CVA   Additional history: Per chart review, patient has a history of cerebellar stroke condition.  This is present on her physical exam as well.   Physical Exam: Pertinent physical exam findings include Left-sided weakness which per chart review is baseline  Lab Tests: I ordered, and personally interpreted labs.  The pertinent results include: Potassium 2.8, hx of hypokalemia Negative troponin x2   Imaging Studies: I ordered and independently visualized and interpreted this x-ray and I agree with the radiologist that there were no acute findings   Cardiac Monitoring:  The patient was maintained on a cardiac monitor.  My attending physician Dr. Zenia Resides viewed and interpreted the cardiac monitored which showed an underlying rhythm of: NSR   Medications: I ordered medication including ASA and ativan. Repleted orally. Reevaluation of the patient after these medicines showed that the patient improved. I have reviewed the patients home medicines and have made  adjustments as needed.   MDM/Disposition: This is a female with a history of substance use disorder presenting today with chest tightness.  Has been going on ever since she smoked what she thought was crack this morning.  ACS work-up negative.  Patient resting comfortably with stable vital signs.  Low suspicion dissection.  Low Wells PE likelihood score.  Patient is likely experiencing side effects of the drug that she used this morning.  She has  been encouraged to stop using drugs.  At this time she is asymptomatic and hemodynamically stable.  Normal vital sign will be discharged home after oral potassium.  She and friend at the bedside are agreeable.   I discussed this case with my attending physician Dr. Zenia Resides who saw the patient, cosigned this note including patient's presenting symptoms, physical exam, and planned diagnostics and interventions. Attending physician stated agreement with plan or made changes to plan which were implemented.      Final Clinical Impression(s) / ED Diagnoses Final diagnoses:  Substance use  Chest tightness  Hypokalemia    Rx / DC Orders ED Discharge Orders     None      Results and diagnoses were explained to the patient. Return precautions discussed in full. Patient had no additional questions and expressed complete understanding.   This chart was dictated using voice recognition software.  Despite best efforts to proofread,  errors can occur which can change the documentation meaning.    Darliss Ridgel 11/28/21 1706    Lacretia Leigh, MD 11/29/21 1635

## 2021-11-28 NOTE — ED Provider Notes (Signed)
I provided a substantive portion of the care of this patient.  I personally performed the entirety of the medical decision making for this encounter.  EKG Interpretation  Date/Time:  Saturday November 28 2021 13:57:49 EDT Ventricular Rate:  89 PR Interval:  122 QRS Duration: 78 QT Interval:  426 QTC Calculation: 519 R Axis:   55 Text Interpretation: Sinus rhythm Abnormal R-wave progression, early transition Consider left ventricular hypertrophy Prolonged QT interval Confirmed by Lacretia Leigh ([id number]) on 11/28/2021 2:08:30 PM    56 year old female presents with constant chest pressure which began about 2 hours ago.  States that it is worse with movement.  Denies any cough congestion.  Did use cocaine about 12 hours ago.  Her EKG per my interpretation shows normal sinus rhythm.  No signs of acute coronary ischemia.  Low suspicion for dissection.  Will order chest x-ray and blood work and treat with Marcy Siren, MD 11/28/21 1409

## 2021-11-28 NOTE — ED Triage Notes (Signed)
Pt states she ingested something this morning, possibly smoking crack but not sure what it was, and now c/o chest pain.

## 2021-11-28 NOTE — Discharge Instructions (Addendum)
Your potassium was low so you were treated with an oral vitamin.  Did not, your lab work looks normal.  Your EKG and x-ray are normal as well.  Your pain was likely a side effect of the drugs you used earlier this morning.  Please stop using drugs.  Return with any recurring or worsening symptoms.

## 2021-12-03 ENCOUNTER — Emergency Department (HOSPITAL_COMMUNITY)
Admission: EM | Admit: 2021-12-03 | Discharge: 2021-12-03 | Discharge status: Against Medical Advice | Payer: Medicaid Other

## 2021-12-26 ENCOUNTER — Other Ambulatory Visit: Payer: Self-pay | Admitting: Internal Medicine

## 2021-12-26 DIAGNOSIS — R7303 Prediabetes: Secondary | ICD-10-CM

## 2022-03-01 ENCOUNTER — Emergency Department (HOSPITAL_COMMUNITY): Payer: Medicaid Other

## 2022-03-01 ENCOUNTER — Inpatient Hospital Stay (HOSPITAL_COMMUNITY)
Admission: EM | Admit: 2022-03-01 | Discharge: 2022-03-19 | DRG: 065 | Disposition: A | Discharge status: Home / Self Care | Payer: Medicaid Other | Attending: Family Medicine | Admitting: Family Medicine

## 2022-03-01 ENCOUNTER — Other Ambulatory Visit: Payer: Self-pay

## 2022-03-01 ENCOUNTER — Encounter (HOSPITAL_COMMUNITY): Payer: Self-pay | Admitting: Radiology

## 2022-03-01 DIAGNOSIS — F1410 Cocaine abuse, uncomplicated: Secondary | ICD-10-CM | POA: Diagnosis not present

## 2022-03-01 DIAGNOSIS — F1910 Other psychoactive substance abuse, uncomplicated: Secondary | ICD-10-CM | POA: Diagnosis present

## 2022-03-01 DIAGNOSIS — I6320 Cerebral infarction due to unspecified occlusion or stenosis of unspecified precerebral arteries: Secondary | ICD-10-CM | POA: Diagnosis not present

## 2022-03-01 DIAGNOSIS — Z882 Allergy status to sulfonamides status: Secondary | ICD-10-CM

## 2022-03-01 DIAGNOSIS — R531 Weakness: Secondary | ICD-10-CM

## 2022-03-01 DIAGNOSIS — Z7984 Long term (current) use of oral hypoglycemic drugs: Secondary | ICD-10-CM

## 2022-03-01 DIAGNOSIS — Z751 Person awaiting admission to adequate facility elsewhere: Secondary | ICD-10-CM

## 2022-03-01 DIAGNOSIS — R29712 NIHSS score 12: Secondary | ICD-10-CM | POA: Diagnosis present

## 2022-03-01 DIAGNOSIS — F17200 Nicotine dependence, unspecified, uncomplicated: Secondary | ICD-10-CM | POA: Diagnosis present

## 2022-03-01 DIAGNOSIS — F4312 Post-traumatic stress disorder, chronic: Secondary | ICD-10-CM

## 2022-03-01 DIAGNOSIS — H538 Other visual disturbances: Secondary | ICD-10-CM | POA: Diagnosis present

## 2022-03-01 DIAGNOSIS — D329 Benign neoplasm of meninges, unspecified: Secondary | ICD-10-CM | POA: Diagnosis present

## 2022-03-01 DIAGNOSIS — Z79899 Other long term (current) drug therapy: Secondary | ICD-10-CM

## 2022-03-01 DIAGNOSIS — Z881 Allergy status to other antibiotic agents status: Secondary | ICD-10-CM

## 2022-03-01 DIAGNOSIS — F1590 Other stimulant use, unspecified, uncomplicated: Secondary | ICD-10-CM | POA: Diagnosis present

## 2022-03-01 DIAGNOSIS — G8194 Hemiplegia, unspecified affecting left nondominant side: Secondary | ICD-10-CM | POA: Diagnosis present

## 2022-03-01 DIAGNOSIS — F209 Schizophrenia, unspecified: Secondary | ICD-10-CM | POA: Diagnosis not present

## 2022-03-01 DIAGNOSIS — F4321 Adjustment disorder with depressed mood: Secondary | ICD-10-CM | POA: Diagnosis present

## 2022-03-01 DIAGNOSIS — E739 Lactose intolerance, unspecified: Secondary | ICD-10-CM | POA: Diagnosis present

## 2022-03-01 DIAGNOSIS — Z8249 Family history of ischemic heart disease and other diseases of the circulatory system: Secondary | ICD-10-CM

## 2022-03-01 DIAGNOSIS — Z880 Allergy status to penicillin: Secondary | ICD-10-CM

## 2022-03-01 DIAGNOSIS — E876 Hypokalemia: Secondary | ICD-10-CM | POA: Diagnosis present

## 2022-03-01 DIAGNOSIS — Z8673 Personal history of transient ischemic attack (TIA), and cerebral infarction without residual deficits: Secondary | ICD-10-CM

## 2022-03-01 DIAGNOSIS — F203 Undifferentiated schizophrenia: Secondary | ICD-10-CM | POA: Diagnosis present

## 2022-03-01 DIAGNOSIS — N939 Abnormal uterine and vaginal bleeding, unspecified: Secondary | ICD-10-CM | POA: Diagnosis not present

## 2022-03-01 DIAGNOSIS — Z820 Family history of epilepsy and other diseases of the nervous system: Secondary | ICD-10-CM

## 2022-03-01 DIAGNOSIS — G4700 Insomnia, unspecified: Secondary | ICD-10-CM | POA: Diagnosis present

## 2022-03-01 DIAGNOSIS — G459 Transient cerebral ischemic attack, unspecified: Secondary | ICD-10-CM | POA: Diagnosis present

## 2022-03-01 DIAGNOSIS — Z5900 Homelessness unspecified: Secondary | ICD-10-CM

## 2022-03-01 DIAGNOSIS — R29810 Facial weakness: Secondary | ICD-10-CM | POA: Diagnosis present

## 2022-03-01 DIAGNOSIS — R140 Abdominal distension (gaseous): Secondary | ICD-10-CM | POA: Diagnosis not present

## 2022-03-01 DIAGNOSIS — R569 Unspecified convulsions: Secondary | ICD-10-CM | POA: Diagnosis present

## 2022-03-01 DIAGNOSIS — Z720 Tobacco use: Secondary | ICD-10-CM

## 2022-03-01 DIAGNOSIS — Z634 Disappearance and death of family member: Secondary | ICD-10-CM

## 2022-03-01 DIAGNOSIS — K0889 Other specified disorders of teeth and supporting structures: Secondary | ICD-10-CM | POA: Diagnosis present

## 2022-03-01 DIAGNOSIS — R112 Nausea with vomiting, unspecified: Secondary | ICD-10-CM | POA: Diagnosis not present

## 2022-03-01 DIAGNOSIS — N938 Other specified abnormal uterine and vaginal bleeding: Secondary | ICD-10-CM

## 2022-03-01 DIAGNOSIS — I6381 Other cerebral infarction due to occlusion or stenosis of small artery: Principal | ICD-10-CM | POA: Diagnosis present

## 2022-03-01 DIAGNOSIS — F449 Dissociative and conversion disorder, unspecified: Secondary | ICD-10-CM | POA: Diagnosis present

## 2022-03-01 DIAGNOSIS — I639 Cerebral infarction, unspecified: Principal | ICD-10-CM

## 2022-03-01 DIAGNOSIS — R9389 Abnormal findings on diagnostic imaging of other specified body structures: Secondary | ICD-10-CM | POA: Diagnosis present

## 2022-03-01 DIAGNOSIS — R109 Unspecified abdominal pain: Secondary | ICD-10-CM | POA: Diagnosis not present

## 2022-03-01 DIAGNOSIS — Z87898 Personal history of other specified conditions: Secondary | ICD-10-CM

## 2022-03-01 LAB — COMPREHENSIVE METABOLIC PANEL
ALT: 15 U/L (ref 0–44)
AST: 19 U/L (ref 15–41)
Albumin: 3.9 g/dL (ref 3.5–5.0)
Alkaline Phosphatase: 87 U/L (ref 38–126)
Anion gap: 11 (ref 5–15)
BUN: 7 mg/dL (ref 6–20)
CO2: 22 mmol/L (ref 22–32)
Calcium: 9.2 mg/dL (ref 8.9–10.3)
Chloride: 108 mmol/L (ref 98–111)
Creatinine, Ser: 0.67 mg/dL (ref 0.44–1.00)
GFR, Estimated: >60 mL/min (ref >60)
Glucose, Bld: 138 mg/dL — ABNORMAL HIGH (ref 70–99)
Potassium: 3.7 mmol/L (ref 3.5–5.1)
Sodium: 141 mmol/L (ref 135–145)
Total Bilirubin: 0.6 mg/dL (ref 0.3–1.2)
Total Protein: 8.2 g/dL — ABNORMAL HIGH (ref 6.5–8.1)

## 2022-03-01 LAB — URINALYSIS, ROUTINE W REFLEX MICROSCOPIC
Bilirubin Urine: NEGATIVE
Glucose, UA: NEGATIVE mg/dL
Ketones, ur: NEGATIVE mg/dL
Nitrite: NEGATIVE
Protein, ur: NEGATIVE mg/dL
Specific Gravity, Urine: 1.034 — ABNORMAL HIGH (ref 1.005–1.030)
pH: 6 (ref 5.0–8.0)

## 2022-03-01 LAB — CBC
HCT: 43.9 % (ref 36.0–46.0)
Hemoglobin: 14.2 g/dL (ref 12.0–15.0)
MCH: 30.2 pg (ref 26.0–34.0)
MCHC: 32.3 g/dL (ref 30.0–36.0)
MCV: 93.4 fL (ref 80.0–100.0)
Platelets: 338 10*3/uL (ref 150–400)
RBC: 4.7 MIL/uL (ref 3.87–5.11)
RDW: 14.5 % (ref 11.5–15.5)
WBC: 9.4 10*3/uL (ref 4.0–10.5)
nRBC: 0 % (ref 0.0–0.2)

## 2022-03-01 LAB — I-STAT BETA HCG BLOOD, ED (MC, WL, AP ONLY): I-stat hCG, quantitative: <5 m[IU]/mL (ref <5)

## 2022-03-01 LAB — DIFFERENTIAL
Abs Immature Granulocytes: 0.03 10*3/uL (ref 0.00–0.07)
Basophils Absolute: 0.1 10*3/uL (ref 0.0–0.1)
Basophils Relative: 1 %
Eosinophils Absolute: 0.2 10*3/uL (ref 0.0–0.5)
Eosinophils Relative: 2 %
Immature Granulocytes: 0 %
Lymphocytes Relative: 17 %
Lymphs Abs: 1.6 10*3/uL (ref 0.7–4.0)
Monocytes Absolute: 0.8 10*3/uL (ref 0.1–1.0)
Monocytes Relative: 9 %
Neutro Abs: 6.6 10*3/uL (ref 1.7–7.7)
Neutrophils Relative %: 71 %

## 2022-03-01 LAB — RAPID URINE DRUG SCREEN, HOSP PERFORMED
Amphetamines: NOT DETECTED
Barbiturates: NOT DETECTED
Benzodiazepines: NOT DETECTED
Cocaine: POSITIVE — AB
Opiates: NOT DETECTED
Tetrahydrocannabinol: POSITIVE — AB

## 2022-03-01 LAB — PROTIME-INR
INR: 1 (ref 0.8–1.2)
Prothrombin Time: 13 seconds (ref 11.4–15.2)

## 2022-03-01 LAB — APTT: aPTT: 29 seconds (ref 24–36)

## 2022-03-01 LAB — CBG MONITORING, ED: Glucose-Capillary: 145 mg/dL — ABNORMAL HIGH (ref 70–99)

## 2022-03-01 MED ORDER — LORAZEPAM 2 MG/ML IJ SOLN
2.0000 mg | Freq: Once | INTRAMUSCULAR | Status: AC
  Administered 2022-03-01: 2 mg via INTRAVENOUS
  Filled 2022-03-01: qty 1

## 2022-03-01 MED ORDER — GADOBUTROL 1 MMOL/ML IV SOLN
7.0000 mL | Freq: Once | INTRAVENOUS | Status: AC | PRN
  Administered 2022-03-01: 7 mL via INTRAVENOUS

## 2022-03-01 MED ORDER — IOHEXOL 350 MG/ML SOLN
100.0000 mL | Freq: Once | INTRAVENOUS | Status: AC | PRN
  Administered 2022-03-01: 100 mL via INTRAVENOUS
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
## 2022-03-01 NOTE — Progress Notes (Addendum)
1130 Stroke cart activated and elert sent to [facility]. Provider already assessed pt and currently at bedside. Pt presents with unknown LKW with c/o L facial droop and L arm and leg deficit. Code stroke called to r/o LVO or subdural hematoma 1136 pt to CT 1142 [facility] paged 2 [facility] Leonel Ramsay, MD) connected via stroke cart. [facility] giving SBAR to [facility] at this time. Pt has not returned from CT and scans continued to be monitored by both [facility] and [facility].  1206 [facility] updated [facility] of negative CT scan for any LVO and pt will not be candidate for IR. [facility] sts [facility] may log off, [facility] will remain on cart until pt returns from CT for assessment. [facility] logged off at this time.  [place], Print production planner

## 2022-03-01 NOTE — ED Notes (Signed)
Pt is asleep will notify MRI tech
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
## 2022-03-01 NOTE — ED Notes (Signed)
MRI has arrived to take the patient. Pt says that her IV is out and she is unsure how it came out. IV restarted. Pt refusing to be transported at this time until "point blank, I am asleep, I am not doing it until then". Ativian administered, dr. Christinia Gully tech made aware of situation and will be notified to return to pick up the patient.

## 2022-03-01 NOTE — ED Notes (Signed)
Patient transported to MRI 

## 2022-03-01 NOTE — ED Notes (Signed)
I made MD Pickering aware about the critical I Stat Chem 8
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
## 2022-03-01 NOTE — Progress Notes (Signed)
Went to get pt for MRI. Pt began tearing up and stating she was too scared to do MRI. Stated she "can't have MRIs and nothing helps me do them, I'm too scared." RN made aware at time of pt refusal.

## 2022-03-01 NOTE — ED Notes (Signed)
Pt. On cardiac monitor. 

## 2022-03-02 ENCOUNTER — Observation Stay (HOSPITAL_COMMUNITY): Payer: Medicaid Other

## 2022-03-02 ENCOUNTER — Observation Stay (HOSPITAL_BASED_OUTPATIENT_CLINIC_OR_DEPARTMENT_OTHER): Payer: Medicaid Other

## 2022-03-02 DIAGNOSIS — R140 Abdominal distension (gaseous): Secondary | ICD-10-CM | POA: Diagnosis not present

## 2022-03-02 DIAGNOSIS — I639 Cerebral infarction, unspecified: Secondary | ICD-10-CM | POA: Diagnosis present

## 2022-03-02 DIAGNOSIS — F4312 Post-traumatic stress disorder, chronic: Secondary | ICD-10-CM | POA: Diagnosis present

## 2022-03-02 DIAGNOSIS — F449 Dissociative and conversion disorder, unspecified: Secondary | ICD-10-CM | POA: Diagnosis present

## 2022-03-02 DIAGNOSIS — F444 Conversion disorder with motor symptom or deficit: Secondary | ICD-10-CM | POA: Diagnosis not present

## 2022-03-02 DIAGNOSIS — F203 Undifferentiated schizophrenia: Secondary | ICD-10-CM | POA: Diagnosis present

## 2022-03-02 DIAGNOSIS — G459 Transient cerebral ischemic attack, unspecified: Secondary | ICD-10-CM | POA: Diagnosis present

## 2022-03-02 DIAGNOSIS — D329 Benign neoplasm of meninges, unspecified: Secondary | ICD-10-CM | POA: Diagnosis present

## 2022-03-02 DIAGNOSIS — R112 Nausea with vomiting, unspecified: Secondary | ICD-10-CM | POA: Diagnosis not present

## 2022-03-02 DIAGNOSIS — R29712 NIHSS score 12: Secondary | ICD-10-CM | POA: Diagnosis present

## 2022-03-02 DIAGNOSIS — H538 Other visual disturbances: Secondary | ICD-10-CM | POA: Diagnosis present

## 2022-03-02 DIAGNOSIS — Z634 Disappearance and death of family member: Secondary | ICD-10-CM | POA: Diagnosis not present

## 2022-03-02 DIAGNOSIS — Z87898 Personal history of other specified conditions: Secondary | ICD-10-CM | POA: Diagnosis not present

## 2022-03-02 DIAGNOSIS — K0889 Other specified disorders of teeth and supporting structures: Secondary | ICD-10-CM | POA: Diagnosis present

## 2022-03-02 DIAGNOSIS — G8194 Hemiplegia, unspecified affecting left nondominant side: Secondary | ICD-10-CM | POA: Diagnosis present

## 2022-03-02 DIAGNOSIS — F4321 Adjustment disorder with depressed mood: Secondary | ICD-10-CM | POA: Diagnosis present

## 2022-03-02 DIAGNOSIS — R29810 Facial weakness: Secondary | ICD-10-CM | POA: Diagnosis present

## 2022-03-02 DIAGNOSIS — F1910 Other psychoactive substance abuse, uncomplicated: Secondary | ICD-10-CM | POA: Diagnosis not present

## 2022-03-02 DIAGNOSIS — F1410 Cocaine abuse, uncomplicated: Secondary | ICD-10-CM | POA: Diagnosis present

## 2022-03-02 DIAGNOSIS — I6381 Other cerebral infarction due to occlusion or stenosis of small artery: Secondary | ICD-10-CM | POA: Diagnosis not present

## 2022-03-02 DIAGNOSIS — E876 Hypokalemia: Secondary | ICD-10-CM | POA: Diagnosis present

## 2022-03-02 DIAGNOSIS — R531 Weakness: Secondary | ICD-10-CM | POA: Diagnosis not present

## 2022-03-02 DIAGNOSIS — E739 Lactose intolerance, unspecified: Secondary | ICD-10-CM | POA: Diagnosis present

## 2022-03-02 DIAGNOSIS — N939 Abnormal uterine and vaginal bleeding, unspecified: Secondary | ICD-10-CM | POA: Diagnosis not present

## 2022-03-02 DIAGNOSIS — R109 Unspecified abdominal pain: Secondary | ICD-10-CM | POA: Diagnosis not present

## 2022-03-02 DIAGNOSIS — Z8249 Family history of ischemic heart disease and other diseases of the circulatory system: Secondary | ICD-10-CM | POA: Diagnosis not present

## 2022-03-02 DIAGNOSIS — F17200 Nicotine dependence, unspecified, uncomplicated: Secondary | ICD-10-CM | POA: Diagnosis present

## 2022-03-02 DIAGNOSIS — I6389 Other cerebral infarction: Secondary | ICD-10-CM | POA: Diagnosis not present

## 2022-03-02 DIAGNOSIS — R569 Unspecified convulsions: Secondary | ICD-10-CM | POA: Diagnosis present

## 2022-03-02 DIAGNOSIS — R9389 Abnormal findings on diagnostic imaging of other specified body structures: Secondary | ICD-10-CM | POA: Diagnosis present

## 2022-03-02 DIAGNOSIS — G4700 Insomnia, unspecified: Secondary | ICD-10-CM | POA: Diagnosis present

## 2022-03-02 LAB — CBC
HCT: 43.4 % (ref 36.0–46.0)
Hemoglobin: 13.9 g/dL (ref 12.0–15.0)
MCH: 30.7 pg (ref 26.0–34.0)
MCHC: 32 g/dL (ref 30.0–36.0)
MCV: 95.8 fL (ref 80.0–100.0)
Platelets: 307 10*3/uL (ref 150–400)
RBC: 4.53 MIL/uL (ref 3.87–5.11)
RDW: 14.8 % (ref 11.5–15.5)
WBC: 8.9 10*3/uL (ref 4.0–10.5)
nRBC: 0 % (ref 0.0–0.2)

## 2022-03-02 LAB — COMPREHENSIVE METABOLIC PANEL
ALT: 13 U/L (ref 0–44)
AST: 17 U/L (ref 15–41)
Albumin: 3.3 g/dL — ABNORMAL LOW (ref 3.5–5.0)
Alkaline Phosphatase: 76 U/L (ref 38–126)
Anion gap: 9 (ref 5–15)
BUN: 11 mg/dL (ref 6–20)
CO2: 20 mmol/L — ABNORMAL LOW (ref 22–32)
Calcium: 9.2 mg/dL (ref 8.9–10.3)
Chloride: 108 mmol/L (ref 98–111)
Creatinine, Ser: 0.94 mg/dL (ref 0.44–1.00)
GFR, Estimated: >60 mL/min (ref >60)
Glucose, Bld: 114 mg/dL — ABNORMAL HIGH (ref 70–99)
Potassium: 3.4 mmol/L — ABNORMAL LOW (ref 3.5–5.1)
Sodium: 137 mmol/L (ref 135–145)
Total Bilirubin: 0.2 mg/dL — ABNORMAL LOW (ref 0.3–1.2)
Total Protein: 7.3 g/dL (ref 6.5–8.1)

## 2022-03-02 LAB — LIPID PANEL
Cholesterol: 147 mg/dL (ref 0–200)
HDL: 40 mg/dL — ABNORMAL LOW (ref >40)
LDL Cholesterol: 79 mg/dL (ref 0–99)
Total CHOL/HDL Ratio: 3.7 RATIO
Triglycerides: 142 mg/dL (ref <150)
VLDL: 28 mg/dL (ref 0–40)

## 2022-03-02 LAB — HIV ANTIBODY (ROUTINE TESTING W REFLEX): HIV Screen 4th Generation wRfx: NONREACTIVE

## 2022-03-02 MED ORDER — ASPIRIN 300 MG RE SUPP: 300.0000 mg | Freq: Every day | RECTAL | Status: DC

## 2022-03-02 MED ORDER — ATORVASTATIN CALCIUM 20 MG PO TABS
40.0000 mg | ORAL_TABLET | Freq: Every day | ORAL | Status: DC
  Administered 2022-03-02 – 2022-03-19 (×18): 40 mg via ORAL
  Filled 2022-03-02 (×12): qty 2
  Filled 2022-03-02: qty 1
  Filled 2022-03-02 (×5): qty 2

## 2022-03-02 MED ORDER — STROKE: EARLY STAGES OF RECOVERY BOOK: Freq: Once | Status: AC

## 2022-03-02 MED ORDER — ATORVASTATIN CALCIUM 40 MG PO TABS: 80.0000 mg | ORAL_TABLET | Freq: Every day | ORAL | Status: DC

## 2022-03-02 MED ORDER — ASPIRIN 300 MG RE SUPP
300.0000 mg | Freq: Every day | RECTAL | Status: DC
  Filled 2022-03-02 (×17): qty 1

## 2022-03-02 MED ORDER — ASPIRIN 81 MG PO TBEC: 81.0000 mg | DELAYED_RELEASE_TABLET | Freq: Every day | ORAL | Status: DC

## 2022-03-02 MED ORDER — SENNOSIDES-DOCUSATE SODIUM 8.6-50 MG PO TABS: 1.0000 | ORAL_TABLET | Freq: Every evening | ORAL | Status: DC | PRN

## 2022-03-02 MED ORDER — ACETAMINOPHEN 650 MG RE SUPP: 650.0000 mg | RECTAL | Status: DC | PRN

## 2022-03-02 MED ORDER — CLOPIDOGREL BISULFATE 75 MG PO TABS
75.0000 mg | ORAL_TABLET | Freq: Every day | ORAL | Status: DC
  Administered 2022-03-02 – 2022-03-19 (×18): 75 mg via ORAL
  Filled 2022-03-02 (×18): qty 1

## 2022-03-02 MED ORDER — ASPIRIN 81 MG PO TBEC
81.0000 mg | DELAYED_RELEASE_TABLET | Freq: Every day | ORAL | Status: DC
  Administered 2022-03-03 – 2022-03-19 (×17): 81 mg via ORAL
  Filled 2022-03-02 (×17): qty 1

## 2022-03-02 MED ORDER — ACETAMINOPHEN 160 MG/5ML PO SOLN: 650.0000 mg | ORAL | Status: DC | PRN

## 2022-03-02 MED ORDER — LORAZEPAM 2 MG/ML IJ SOLN
1.0000 mg | Freq: Once | INTRAMUSCULAR | Status: AC | PRN
  Administered 2022-03-02: 1 mg via INTRAVENOUS
  Filled 2022-03-02: qty 1

## 2022-03-02 MED ORDER — ENOXAPARIN SODIUM 40 MG/0.4ML IJ SOSY
40.0000 mg | PREFILLED_SYRINGE | INTRAMUSCULAR | Status: DC
  Administered 2022-03-03 – 2022-03-19 (×15): 40 mg via SUBCUTANEOUS
  Filled 2022-03-02 (×17): qty 0.4

## 2022-03-02 MED ORDER — LORAZEPAM 2 MG/ML IJ SOLN
0.5000 mg | Freq: Once | INTRAMUSCULAR | Status: AC
  Administered 2022-03-02: 0.5 mg via INTRAVENOUS
  Filled 2022-03-02: qty 1

## 2022-03-02 MED ORDER — SODIUM CHLORIDE 0.9 % IV SOLN: INTRAVENOUS | Status: DC

## 2022-03-02 MED ORDER — ASPIRIN 325 MG PO TBEC
325.0000 mg | DELAYED_RELEASE_TABLET | Freq: Every day | ORAL | Status: DC
  Administered 2022-03-02: 325 mg via ORAL
  Filled 2022-03-02: qty 1

## 2022-03-02 MED ORDER — ACETAMINOPHEN 325 MG PO TABS
650.0000 mg | ORAL_TABLET | ORAL | Status: DC | PRN
  Administered 2022-03-07 – 2022-03-18 (×13): 650 mg via ORAL
  Filled 2022-03-02 (×15): qty 2

## 2022-03-02 NOTE — Progress Notes (Signed)
OT Cancellation Note  Patient Details Name: Joanne Gonzalez MRN: [id number] DOB: 04/25/1965   Cancelled Treatment:    Reason Eval/Treat Not Completed: Patient declined, no reason specified Patient declined to participate in session asking repeatedly for food/drink with NPO order in place. OT to continue to follow and check back as schedule will allow.  Rennie Plowman, MS Acute Rehabilitation Department Office# [phone]  03/02/2022, 9:01 AM

## 2022-03-02 NOTE — Progress Notes (Signed)
  Progress Note   Patient: Joanne Gonzalez GGY:[id number] DOB: 1965-08-09 DOA: 03/01/2022     0 DOS: the patient was seen and examined on 03/02/2022   Brief hospital course: 56 year old female with a history of conversion disorder.  She presented multiple times 11/16/2019 with complaint of left-sided weakness thought to be functional.  She again presents today with complaints of left-sided weakness.  She has left-sided aural facial droop and lower extremity, noted since day prior to visit. Here for stroke w/u  Assessment and Plan: CVA/left temporoparietal region -MRI reviewed. Finding notable for punctate acute ischemic infarct in cortical gray matter of L temporoparietal region -Aspirin ordered -On atorvastatin, LDL 79 -PT/OT/speech consulted -Neuro consulted.  -Attempted MRA brain, however, reportedly had motion artifact, thus unclear how useful study would be. Defer to Neuro -recheck cbc and bmet in AM    Substance abuse ([place])  Cocaine abuse ([place]) -Will need cessation this visit    Undifferentiated schizophrenia ([place])  Adjustment disorder with depressed mood  Chronic post-traumatic stress disorder (PTSD) -Seroquel, risperidone, Neurontin, mirtazapine and trazodone were initially held at presentation with concerns that these may affect neuroexams   Conversion disorder -Follow closely    Tobacco use -Nicotine patch ordered -Need cessation      Subjective: Complains of continued weakness of L side.   Physical Exam: Vitals:   03/02/22 1000 03/02/22 1300 03/02/22 1314 03/02/22 1345  BP: 130/82 137/83  123/85  Pulse: 79 66 77 79  Resp: 13 (!) '22 19 18  '$ Temp:    98.3 F (36.8 C)  TempSrc:    Oral  SpO2: 94% 90% 99% 96%  Weight:       General exam: Awake, laying in bed, in nad Respiratory system: Normal respiratory effort, no wheezing Cardiovascular system: regular rate, s1, s2 Gastrointestinal system: Soft, nondistended, positive BS Central nervous system: CN2-12 grossly  intact, strength intact Extremities: Perfused, no clubbing Skin: Normal skin turgor, no notable skin lesions seen Psychiatry: Mood normal // no visual hallucinations   Data Reviewed:  Labs reviewed: Na 137, K 3.4, Cr 0.94  Family Communication: Pt in room, family not at bedside  Disposition: Status is: Observation The patient will require care spanning > 2 midnights and should be moved to inpatient because: Severity of illness  Planned Discharge Destination: Home    Author: Marylu Lund, MD 03/02/2022 5:16 PM  For on call review [email or web address].

## 2022-03-02 NOTE — Hospital Course (Addendum)
56 year old female with a history of conversion disorder.  She presented multiple times 11/16/2019 with complaint of left-sided weakness thought to be functional.  She again presents today with complaints of left-sided weakness.  She has left-sided aural facial droop and lower extremity, noted since day prior to visit. Here for stroke w/u.  Medically stable.  Unsafe discharge home therefore currently awaiting SNF placement in the hospital.

## 2022-03-02 NOTE — Progress Notes (Signed)
Completed NIH assessment as ordered by physician. Unreliable assessment as patient observed moving left sided limbs to eat/drink and move self up in bed, but would not move and claimed no sensation to left side during NIH assessment. Also, verbally, patient able to clearly state her wants/needs without tongue protrusion, but observed to push her tongue to left side and slur words when reading from [place] papers. No deficits seen with swallowing. Updated bedside RN.

## 2022-03-02 NOTE — Progress Notes (Signed)
  PT Cancellation Note  Patient Details Name: Joanne Gonzalez MRN: [id number] DOB: 1966-01-29   Cancelled Treatment:    Reason Eval/Treat Not Completed: Patient declined, reports headache, wants something to drink, NPO in  orders. Will check back another time. [place] Office [phone] Weekend [phone]   [name] 03/02/2022, 11:01 AM

## 2022-03-02 NOTE — Progress Notes (Signed)
Pt arrived to unit. Vitals stable. Pt came in wheelchair. Pt was able to stand and pivot to bed. Provider notified.

## 2022-03-02 NOTE — Progress Notes (Signed)
Bilateral lower extremity venous duplex has been completed. Preliminary results can be found in CV Proc through chart review.  Results were given to [place] NP.  03/02/22 2:17 PM Joanne Gonzalez RVT

## 2022-03-02 NOTE — Progress Notes (Signed)
Neurology Progress Note  Brief HPI: Patient with a history of conversion disorder has presented with left-sided weakness and facial droop which started on 12/3.  Patient has been seen multiple times for left-sided weakness, which has been determined to be functional previously.  She states that she has been under increased stress lately.  Her last known well time was unclear.  MRI shows left temporoparietal punctate stroke as well as a right paraclinoid mass concerning for meningioma.  Subjective: Patient complains of hunger and thirst and states that she continues to have left-sided weakness.  Exam: Vitals:   03/02/22 1314 03/02/22 1345  BP:  123/85  Pulse: 77 79  Resp: 19 18  Temp:  98.3 F (36.8 C)  SpO2: 99% 96%   Gen: In bed, NAD Resp: non-labored breathing, no acute distress  Neuro: Mental Status: Drowsy, but oriented to person place time and situation, able to follow simple and complex commands Cranial Nerves: Pupils equal round and reactive, extraocular movements intact, facial sensation diminished on the left, mild left facial droop, hearing intact to voice, phonation normal, tongue midline, shoulder shrug symmetric Motor: Moves right upper and lower extremities with good antigravity strength, but some drift noted in bilateral arms.  This may be effort dependent.  Unable to lift left lower extremity off of bed.  Positive Hoover sign Sensory: Sensation diminished on the left. Gait: Deferred  Pertinent Labs:    Latest Ref Rng & Units 03/02/2022    4:56 AM 03/01/2022   11:42 AM 11/28/2021    1:56 PM  CBC  WBC 4.0 - 10.5 K/uL 8.9  9.4  8.2   Hemoglobin 12.0 - 15.0 g/dL 13.9  14.2  13.1   Hematocrit 36.0 - 46.0 % 43.4  43.9  40.6   Platelets 150 - 400 K/uL 307  338  275        Latest Ref Rng & Units 03/02/2022    4:56 AM 03/01/2022   11:42 AM 11/28/2021    1:56 PM  BMP  Glucose 70 - 99 mg/dL 114  138  169   BUN 6 - 20 mg/dL '11  7  12   '$ Creatinine 0.44 - 1.00 mg/dL 0.94   0.67  0.97   Sodium 135 - 145 mmol/L 137  141  140   Potassium 3.5 - 5.1 mmol/L 3.4  3.7  2.8   Chloride 98 - 111 mmol/L 108  108  110   CO2 22 - 32 mmol/L '20  22  22   '$ Calcium 8.9 - 10.3 mg/dL 9.2  9.2  9.4   Urine drug screen positive for cocaine and THC Lipid Panel     Component Value Date/Time   CHOL 147 03/02/2022 0456   TRIG 142 03/02/2022 0456   HDL 40 (L) 03/02/2022 0456   CHOLHDL 3.7 03/02/2022 0456   VLDL 28 03/02/2022 0456   LDLCALC 79 03/02/2022 0456    A1c pending Imaging Reviewed:  CT head: No acute abnormality  MRI brain: Punctate acute ischemic infarct in left temporoparietal region, chronic microvascular ischemic disease, small enhancing right paraclinoid mass likely meningioma  MRA head: No LVO  Assessment: 56 year old patient with history of conversion disorder initially presented with left-sided weakness with functional characteristics.  However, on brain MRI she was found to have a small acute infarct in the left temporoparietal region.  This infarct does not explain her symptoms and is likely incidental.  Her symptoms of left-sided weakness appear functional with positive Hoover sign on exam.  RN also states that she has observed patient moving her left side well.  Echocardiogram performed shows concern for PFO, however patient's bilateral lower extremity ultrasound shows no DVT, eliminating concern for paradoxical embolism.  Given small size of stroke, stroke is most likely due to small vessel disease and is not embolic in origin, so no need for TEE at this time.  Will start patient on statin given mildly elevated LDL.  Small meningiomas seen on brain MRI with and without contrast, however this is likely not the cause of her acute symptoms, and outpatient neurosurgical follow-up is appropriate.  Impression: Incidental acute ischemic stroke in patient with functional left-sided weakness.   Recommendations:  -Outside of window for permissive hypertension -Start  atorvastatin 40 mg daily for elevated LDL -Aspirin 81 mg daily and Plavix 75 mg daily antiplt/anticoag - q4 hr neuro checks - STAT head CT for any change in neuro exam - Tele - PT/OT/SLP - Stroke education - Amb referral to neurology upon discharge - Neurology to sign off, please re-engage if additional neurologic concerns arise.  [place] , MSN, AGACNP-BC [facility] See Amion for schedule and pager information 03/02/2022 3:53 PM     Attending Neurohospitalist Addendum Patient seen and examined with APP/Resident. Agree with the history and physical as documented above. Agree with the plan as documented, which I helped formulate. I have edited the note above to reflect my full findings and recommendations. I have independently reviewed the chart, obtained history, review of systems and examined the patient.I have personally reviewed pertinent head/neck/spine imaging (CT/MRI). Please feel free to call with any questions.  -- Su Monks, MD [facility] [phone]  If 7pm- 7am, please page neurology on call as listed in [place].

## 2022-03-02 NOTE — Progress Notes (Signed)
2nd attempt at MRA. Pt was unable to hold still for MRA of the neck. MRA of the head was completed along with MRI of the brain last night. Pt is currently not a candidate of a MRA of the neck due to her level of claustrophobia.

## 2022-03-03 ENCOUNTER — Encounter (HOSPITAL_COMMUNITY): Payer: Self-pay | Admitting: Internal Medicine

## 2022-03-03 DIAGNOSIS — F449 Dissociative and conversion disorder, unspecified: Secondary | ICD-10-CM

## 2022-03-03 DIAGNOSIS — I639 Cerebral infarction, unspecified: Principal | ICD-10-CM

## 2022-03-03 DIAGNOSIS — F1910 Other psychoactive substance abuse, uncomplicated: Secondary | ICD-10-CM | POA: Diagnosis not present

## 2022-03-03 DIAGNOSIS — E876 Hypokalemia: Secondary | ICD-10-CM

## 2022-03-03 DIAGNOSIS — F4321 Adjustment disorder with depressed mood: Secondary | ICD-10-CM

## 2022-03-03 DIAGNOSIS — F203 Undifferentiated schizophrenia: Secondary | ICD-10-CM

## 2022-03-03 LAB — BASIC METABOLIC PANEL
Anion gap: 9 (ref 5–15)
BUN: 8 mg/dL (ref 6–20)
CO2: 21 mmol/L — ABNORMAL LOW (ref 22–32)
Calcium: 8.8 mg/dL — ABNORMAL LOW (ref 8.9–10.3)
Chloride: 107 mmol/L (ref 98–111)
Creatinine, Ser: 0.86 mg/dL (ref 0.44–1.00)
GFR, Estimated: >60 mL/min (ref >60)
Glucose, Bld: 160 mg/dL — ABNORMAL HIGH (ref 70–99)
Potassium: 3.2 mmol/L — ABNORMAL LOW (ref 3.5–5.1)
Sodium: 137 mmol/L (ref 135–145)

## 2022-03-03 LAB — HEMOGLOBIN A1C
Hgb A1c MFr Bld: 5.8 % — ABNORMAL HIGH (ref 4.8–5.6)
Mean Plasma Glucose: 120 mg/dL

## 2022-03-03 LAB — MAGNESIUM: Magnesium: 1.9 mg/dL (ref 1.7–2.4)

## 2022-03-03 MED ORDER — POTASSIUM CHLORIDE CRYS ER 10 MEQ PO TBCR
40.0000 meq | EXTENDED_RELEASE_TABLET | ORAL | Status: AC
  Administered 2022-03-03: 40 meq via ORAL
  Filled 2022-03-03: qty 4

## 2022-03-03 MED ORDER — MELATONIN 5 MG PO TABS
5.0000 mg | ORAL_TABLET | Freq: Once | ORAL | Status: AC
  Administered 2022-03-03: 5 mg via ORAL
  Filled 2022-03-03: qty 1

## 2022-03-03 NOTE — Evaluation (Signed)
Speech Language Pathology Evaluation Patient Details Name: Breauna Mazzeo MRN: [id number] DOB: Dec 10, 1965 Today's Date: 03/03/2022 Time: 2703-5009 SLP Time Calculation (min) (ACUTE ONLY): 18 min  Problem List:  Patient Active Problem List   Diagnosis Date Noted   TIA (transient ischemic attack) 03/02/2022   Stroke ([place]) 03/02/2022   Dysfunctional uterine bleeding 07/06/2021   Schizophrenia spectrum disorder with psychotic disorder type not yet determined ([place]) 04/14/2021   Adjustment disorder with mixed anxiety and depressed mood 04/13/2021   Cocaine abuse ([place]) 04/13/2021   Crack cocaine use 04/10/2021   Prediabetes 03/09/2021   COVID-19 02/26/2021   History of trichomoniasis 02/26/2021   Substance induced mood disorder ([place]) 02/05/2021   Dysmenorrhea 11/25/2020   Psychophysiological insomnia 11/11/2020   Elevated LDL cholesterol level 11/11/2020   Hypokalemia 11/11/2020   Chronic pain of both hips 11/11/2020   Vitamin D deficiency 11/11/2020   Trichimoniasis 11/11/2020   Stroke (cerebrum) ([place]) 02/10/2019   Hypocalcemia 02/10/2019   History of seizures 02/10/2019   Homelessness 11/26/2018   Adjustment disorder with depressed mood 11/26/2018   Substance abuse ([place])    Insect bite of left leg, initial encounter 06/24/2018   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   Tobacco use 01/21/2016   H/O: CVA (cerebrovascular accident) 09/26/2015   Chest pain 09/26/2015   Chronic post-traumatic stress disorder (PTSD) 11/14/2014   Acute cystitis without hematuria 11/12/2014   Iron deficiency anemia 09/01/2014   Hyperglycemia 09/01/2014   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  Pt found to have left temporoparietal region acute punctate strokes, h/o substance abuse, depression, schizophrenia and cva.   MRI with movement artifact thus ? uncertain to accuracy.  Pt has previously presented with complaint of left-sided weakness thought to be functional. She has left-sided aural facial droop and lower extremity new since day prior to admit per pt. Speech evaluation ordered.  Per MD notes, pt with decreased participation during evaluation.  Pt reports she lives alone and manages her own daily activities.   Assessment / Plan / Recommendation Clinical Impression  Limited assessment due to pt's decreased participation.  She frequently closed her eyes during the session and only answered approx 25% of SLP questions with a delay and requiring repetition.  Lingual deviation slightly to right noted with protrusion and decreaesed mandibular opening.  Pt was able to name 4/4 objects, answer basic 3/5 - 60% correct and complex yes/no questions with 2/3 - 66% correct.  Pt is dysarthric but she appears to have clear speech when she desires to have needs met - thus suspect motivation factor.  She was oriented to self, DOB, medical diagnosis and current state - not oriented to current date or year.  Perseveration noted x1 only.   Pt is able to articulate need to call nurse using call bell for assist with delay.  No language deficits present based on evaluation - and pt reports she is independent PTA. Recommend  to consider follow up with SLP at next venue of care to assure pt able to manage her premorbid duties.  Advised RN to findings/recommendations. No SLP follow up needed.  Provided pt with feedback regarding test results and encouraged her to assure  she attempts to speak slowly and clearly to assure her needs are met.    SLP Assessment  SLP Recommendation/Assessment: All further Speech Lanaguage Pathology  needs can be addressed in the next venue of care SLP Visit Diagnosis: Dysarthria and anarthria (R47.1)    Recommendations for follow up therapy are one component of a multi-disciplinary discharge planning process, led by the attending physician.  Recommendations may be updated based on patient status, additional functional criteria and insurance authorization.    Follow Up Recommendations  Follow physician's recommendations for discharge plan and follow up therapies    Assistance Recommended at Discharge  Frequent or constant Supervision/Assistance  Functional Status Assessment Patient has had a recent decline in their functional status and/or demonstrates limited ability to make significant improvements in function in a reasonable and predictable amount of time  Frequency and Duration     N/a      SLP Evaluation Cognition  Overall Cognitive Status: Difficult to assess Arousal/Alertness: Awake/alert (but tends to close her eyes and not answer questions consistently) Year:  (stated 56) Month:  (did not verbalize) Day of Week: Incorrect Attention: Focused Focused Attention: Impaired Focused Attention Impairment: Verbal basic;Functional basic Memory: Impaired Awareness: Impaired Problem Solving: Impaired Problem Solving Impairment: Functional basic Behaviors: Other (comment) (flat affect, delayed responses if she responds, single episode of perseveration) Safety/Judgment: Impaired Comments: pt did not locate call bell to reach out to RN for assistance       Comprehension  Auditory Comprehension Overall Auditory Comprehension: Impaired Yes/No Questions: Impaired Basic Biographical Questions:  (3/5 basic questions answered correctly) Complex Questions:  (2/3 questions answered correctly) Commands: Impaired One Step Basic  Commands: 50-74% accurate Two Step Basic Commands: 25-49% accurate Conversation: Simple Interfering Components: Attention EffectiveTechniques: Repetition;Increased volume;Stressing words Visual Recognition/Discrimination Discrimination: Not tested Reading Comprehension Reading Status: Not tested    Expression Expression Primary Mode of Expression: Verbal Verbal Expression Overall Verbal Expression: Impaired Initiation: No impairment Repetition: No impairment Naming: No impairment Pragmatics: No impairment Written Expression Dominant Hand: Right   Oral / Motor  Motor Speech Respiration: Impaired Level of Impairment: Word Phonation: Low vocal intensity Resonance: Within functional limits Articulation: Impaired Level of Impairment: Phrase Intelligibility: Intelligibility reduced Word: 50-74% accurate Phrase: 25-49% accurate Sentence: Not tested Conversation: Not tested Motor Planning: Witnin functional limits Interfering Components: Inadequate dentition Effective Techniques: Slow rate;Increased vocal intensity            Macario Golds 03/03/2022, 12:14 PM Kathleen Lime, MS [facility] SLP Acute Rehab Services Office [phone] Pager [phone]

## 2022-03-03 NOTE — Evaluation (Signed)
Physical Therapy Evaluation Patient Details Name: Joanne Gonzalez MRN: [id number] DOB: Oct 19, 1965 Today's Date: 03/03/2022  History of Present Illness  Joanne Gonzalez is a 55 year old female who presented with left sided weakness. MRI revealed "Punctate acute ischemic nonhemorrhagic infarct involving the cortical gray matter of the left temporoparietal region.1.9 x 1.0 x 1.9 cm enhancing right paraclinoid mass, most characteristic of a meningioma." PMH: conversion disorder, schizophrenia, seizures, CVA, depression, PTSD, substance abuse.   Clinical Impression  Pt admitted with above diagnosis. Pt appears agitated upon arrival, but agreeable to therapy with encouragement and education. Pt with inconsistent LLE/LUE movement and deficits throughout eval. Per MMT pt with questionable effort, per visual observation pt able to mobilize LLE into/out of bed with increased time.  Pt declines transfers or ambulation. Unsure of pt's discharge plan, pt reports living with friend and doesn't provide details. Pt may need HHPT or SNF if needing significant assistance with transfers and ambulation and doesn't have someone to assist at home; will continue to assess. Pt currently with functional limitations due to the deficits listed below (see PT Problem List). Pt will benefit from skilled PT to increase their independence and safety with mobility to allow discharge to the venue listed below.          Recommendations for follow up therapy are one component of a multi-disciplinary discharge planning process, led by the attending physician.  Recommendations may be updated based on patient status, additional functional criteria and insurance authorization.  Follow Up Recommendations Other (comment) (TBD)      Assistance Recommended at Discharge Frequent or constant Supervision/Assistance  Patient can return home with the following  A little help with walking and/or transfers;A little help with  bathing/dressing/bathroom;Assistance with cooking/housework;Assist for transportation    Equipment Recommendations Other (comment) (TBD)  Recommendations for Other Services       Functional Status Assessment Patient has had a recent decline in their functional status and demonstrates the ability to make significant improvements in function in a reasonable and predictable amount of time.     Precautions / Restrictions Precautions Precautions: Fall Restrictions Weight Bearing Restrictions: No      Mobility  Bed Mobility Overal bed mobility: Needs Assistance Bed Mobility: Supine to Sit, Sit to Supine  Supine to sit: Min guard Sit to supine: Min guard  General bed mobility comments: max encouragement and sequencing, pt needing increased time, able to adduct LLE to EOB with increased time, able to lift and abduct LLE to position of comfort when returning to supine    Transfers  General transfer comment: pt declines    Ambulation/Gait  General Gait Details: pt declines  Stairs            Wheelchair Mobility    Modified Rankin (Stroke Patients Only)       Balance Overall balance assessment: Needs assistance Sitting-balance support: Feet supported Sitting balance-Leahy Scale: Good Sitting balance - Comments: seated EOB       Pertinent Vitals/Pain Pain Assessment Pain Assessment: Faces Faces Pain Scale: Hurts even more Pain Location: generalized pain Pain Descriptors / Indicators: Guarding Pain Intervention(s): Limited activity within patient's tolerance, Monitored during session    Home Living Family/patient expects to be discharged to:: Unsure  Available Help at Discharge: Friend(s);Available PRN/intermittently    Prior Function Prior Level of Function : Independent/Modified Independent  Mobility Comments: pt reports being ind prior to this hospitalization ADLs Comments: patient reported being independent prior level     Hand Dominance   Dominant  Hand: Right    Extremity/Trunk Assessment   Upper Extremity Assessment Upper Extremity Assessment: Defer to OT evaluation    Lower Extremity Assessment Lower Extremity Assessment: RLE deficits/detail;LLE deficits/detail RLE Deficits / Details: AROM WFL, MMT: hip flexion 3+/5, hip abduction 3+/5, hip adduction 3+/5, knee flexion 3+/5, ankle 3+/5 RLE Sensation: WNL RLE Coordination: WNL LLE Deficits / Details: Difficult to assess; MMT: hip flexion 2-/5, hip adduction 2-/5, 0/5 for hip abduction, knee flexion and knee extension. Pt reports "feels different" when checking sensation throughout LLE circumferentially, unable to determine specific areas of concern regarding sensation. Functionally, pt able to mobilize LLE to EOB and lift back into bed with bed mobility       Communication   Communication: No difficulties  Cognition Arousal/Alertness: Awake/alert Behavior During Therapy: Flat affect Overall Cognitive Status: Difficult to assess  General Comments: Pt appears agitated, not wanting to answer a&o questions but does state name with encouragement. Pt minimally conversational, able to follow commands with increased time.        General Comments      Exercises     Assessment/Plan    PT Assessment Patient needs continued PT services  PT Problem List Decreased strength;Decreased range of motion;Decreased activity tolerance;Decreased balance;Decreased mobility;Decreased knowledge of use of DME;Decreased safety awareness;Impaired sensation;Pain       PT Treatment Interventions DME instruction;Gait training;Functional mobility training;Therapeutic activities;Therapeutic exercise;Balance training;Patient/family education;Neuromuscular re-education    PT Goals (Current goals can be found in the Care Plan section)  Acute Rehab PT Goals Patient Stated Goal: none stated PT Goal Formulation: With patient Time For Goal Achievement: 03/17/22 Potential to Achieve Goals: Fair     Frequency Min 2X/week     Co-evaluation PT/OT/SLP Co-Evaluation/Treatment: Yes Reason for Co-Treatment: Necessary to address cognition/behavior during functional activity;For patient/therapist safety PT goals addressed during session: Mobility/safety with mobility;Balance;Strengthening/ROM         AM-PAC PT "6 Clicks" Mobility  Outcome Measure Help needed turning from your back to your side while in a flat bed without using bedrails?: A Little Help needed moving from lying on your back to sitting on the side of a flat bed without using bedrails?: A Little Help needed moving to and from a bed to a chair (including a wheelchair)?: Total Help needed standing up from a chair using your arms (e.g., wheelchair or bedside chair)?: Total Help needed to walk in hospital room?: Total Help needed climbing 3-5 steps with a railing? : Total 6 Click Score: 10    End of Session   Activity Tolerance: Other (comment) (pt reports tired, nauseas, pain) Patient left: in bed;with call bell/phone within reach;with bed alarm set Nurse Communication: Mobility status PT Visit Diagnosis: Other abnormalities of gait and mobility (R26.89);Muscle weakness (generalized) (M62.81)    Time: 2229-7989 PT Time Calculation (min) (ACUTE ONLY): 17 min   Charges:   PT Evaluation $PT Eval Low Complexity: 1 Low           Tori Anastyn Ayars PT, DPT 03/03/22, 1:13 PM

## 2022-03-03 NOTE — Progress Notes (Signed)
PROGRESS NOTE    Joanne Gonzalez  OVZ:[id number] DOB: [birth year] DOA: 03/01/2022 PCP: Pcp, No    Chief Complaint  Patient presents with   Cerebrovascular Accident    Brief Narrative:  56 year old female with a history of conversion disorder.  She presented multiple times 11/16/2019 with complaint of left-sided weakness thought to be functional.  She again presents today with complaints of left-sided weakness.  She has left-sided aural facial droop and lower extremity, noted since day prior to visit. Here for stroke w/u     Assessment & Plan:   Principal Problem:   TIA (transient ischemic attack) Active Problems:   Undifferentiated schizophrenia ([place])   Homelessness   Adjustment disorder with depressed mood   Substance abuse ([place])   History of seizures   Cocaine abuse ([place])   Chronic post-traumatic stress disorder (PTSD)   Conversion disorder   Tobacco use   Stroke ([place])  #1 left temporal frontal region CVA/ Left sided weakness -Patient presented with left facial droop left-sided weakness. -CT head done negative for any acute abnormalities. -CT angiogram head and neck done with no emergent LVO or approximately hemodynamic significant stenosis.  Right paraclinoid mass which measures approximately 2.2 x 1.9 cm most likely meningioma.  Normal CT perfusion noted no evidence of penumbra or core infarct. -CT perfusion done normal, no evidence of penumbra or core infarct. -MRI brain with punctuate acute ischemic nonhemorrhagic infarct involving the cortical gray matter of the left temporoparietal region.  Underlying moderately advanced chronic microvascular ischemic disease.  1.9 x 1.0 x 1.9 cm enhancing right paraclinoid mass most characteristic of a meningioma no associated edema or mass effect. -Lower extremity Dopplers negative for DVT. -MRA brain with no acute abnormalities. -LDL noted at 79. -Check a 2D echo. -Patient seen in consultation by neurology who feel incidental acute  ischemic stroke and patient noted with functional left-sided weakness which does not correspond to patient's symptoms and recommending atorvastatin 40 mg daily, aspirin 81 mg daily and Plavix 75 mg daily for secondary stroke prophylaxis. -Patient noted to currently be out of the window for permissive hypertension. -PT/OT/SLP. -TOC consulted for SNF placement.  2.  Substance abuse/cocaine abuse -Cessation stressed to patient.  3.  History of schizophrenia/adjustment disorder with depressed mood/PTSD -Seroquel, Risperdal, Neurontin, mirtazapine and trazodone initially held on presentation due to concern that this may affect patient's neuro examinations.  4.  Conversion disorder -Follow-up.  5.  Tobacco use Tobacco cessation. -Nicotine patch.  6.  Hypokalemia -Replete.   DVT prophylaxis: Lovenox Code Status: Full Family Communication: Updated patient.  No family at bedside. Disposition: SNF  Status is: Inpatient Remains inpatient appropriate because: Unsafe disposition.   Consultants:  Neuro hospitalist: Dr. Leonel Ramsay 03/01/2022  Procedures:  2D echo pending 03/03/2022 Ct angiogram head and neck 03/01/2022 CT cerebral perfusion 03/01/2022 MRI brain 03/01/2022 MRA head 03/02/2022 Lower extremity Dopplers 03/02/2022 CT head without contrast 03/01/2022  Antimicrobials:  Anti-infectives (From admission, onward)    None         Subjective: Patient complaining of some left-sided pain and left facial pain.  Patient noted initially on evaluation with some left-sided weakness however during the conversation left-sided weakness seem to resolve during the conversation and go back to left-sided weakness.  Objective: Vitals:   03/02/22 2308 03/03/22 0014 03/03/22 0434 03/03/22 0832  BP:  130/85 110/71 122/82  Pulse:  94 86 85  Resp: (!) '21 20 20 20  '$ Temp:  99.2 F (37.3 C) 98.3 F (36.8 C) 98.9 F (37.2  C)  TempSrc:  Oral Oral Oral  SpO2:  96% 96% 96%  Weight:         Intake/Output Summary (Last 24 hours) at 03/03/2022 1231 Last data filed at 03/03/2022 1001 Gross per 24 hour  Intake 1840 ml  Output 850 ml  Net 990 ml   Filed Weights   03/01/22 1124  Weight: 70.8 kg    Examination:  General exam: Appears calm and comfortable  Respiratory system: Clear to auscultation. Respiratory effort normal. Cardiovascular system: S1 & S2 heard, RRR. No JVD, murmurs, rubs, gallops or clicks. No pedal edema. Gastrointestinal system: Abdomen is nondistended, soft and nontender. No organomegaly or masses felt. Normal bowel sounds heard. Central nervous system: Alert and oriented. ?  Left facial weakness however when patient is distracted no facial weakness noted.  Patient with complaints of left lower extremity and left upper extremity weakness however when distracted patient seems to be moving left extremities.   Extremities: Symmetric 5 x 5 power. Skin: No rashes, lesions or ulcers Psychiatry: Judgement and insight appear normal. Mood & affect appropriate.     Data Reviewed: I have personally reviewed following labs and imaging studies  CBC: Recent Labs  Lab 03/01/22 1142 03/02/22 0456  WBC 9.4 8.9  NEUTROABS 6.6  --   HGB 14.2 13.9  HCT 43.9 43.4  MCV 93.4 95.8  PLT 338 629    Basic Metabolic Panel: Recent Labs  Lab 03/01/22 1142 03/02/22 0456 03/03/22 1002  NA 141 137 137  K 3.7 3.4* 3.2*  CL 108 108 107  CO2 22 20* 21*  GLUCOSE 138* 114* 160*  BUN '7 11 8  '$ CREATININE 0.67 0.94 0.86  CALCIUM 9.2 9.2 8.8*    GFR: Estimated Creatinine Clearance: 73.8 mL/min (by C-G formula based on SCr of 0.86 mg/dL).  Liver Function Tests: Recent Labs  Lab 03/01/22 1142 03/02/22 0456  AST 19 17  ALT 15 13  ALKPHOS 87 76  BILITOT 0.6 0.2*  PROT 8.2* 7.3  ALBUMIN 3.9 3.3*    CBG: Recent Labs  Lab 03/01/22 1123  GLUCAP 145*     No results found for this or any previous visit (from the past 240 hour(s)).       Radiology  Studies: VAS [place] LOWER EXTREMITY VENOUS (DVT)  Result Date: 03/02/2022  Lower Venous DVT Study Patient Name:  ERNISHA SORN  Date of Exam:   03/02/2022 Medical Rec #: [id number]      Accession #:    [id number] Date of Birth: 1965-09-12     Patient Gender: F Patient Age:   89 years Exam Location:  [facility] Procedure:      VAS [place] LOWER EXTREMITY VENOUS (DVT) Referring Phys: Elwin Sleight DE LA TORRE --------------------------------------------------------------------------------  Indications: Stroke.  Risk Factors: None identified. Limitations: Poor ultrasound/tissue interface and patient positioning. Comparison Study: No prior studies. Performing Technologist: Oliver Hum RVT  Examination Guidelines: A complete evaluation includes B-mode imaging, spectral Doppler, color Doppler, and power Doppler as needed of all accessible portions of each vessel. Bilateral testing is considered an integral part of a complete examination. Limited examinations for reoccurring indications may be performed as noted. The reflux portion of the exam is performed with the patient in reverse Trendelenburg.  +---------+---------------+---------+-----------+----------+--------------+ RIGHT    CompressibilityPhasicitySpontaneityPropertiesThrombus Aging +---------+---------------+---------+-----------+----------+--------------+ CFV      Full           Yes      Yes                                 +---------+---------------+---------+-----------+----------+--------------+  SFJ      Full                                                        +---------+---------------+---------+-----------+----------+--------------+ FV Prox  Full                                                        +---------+---------------+---------+-----------+----------+--------------+ FV Mid   Full                                                        +---------+---------------+---------+-----------+----------+--------------+ FV  DistalFull                                                        +---------+---------------+---------+-----------+----------+--------------+ PFV      Full                                                        +---------+---------------+---------+-----------+----------+--------------+ POP      Full           Yes      Yes                                 +---------+---------------+---------+-----------+----------+--------------+ PTV      Full                                                        +---------+---------------+---------+-----------+----------+--------------+ PERO     Full                                                        +---------+---------------+---------+-----------+----------+--------------+   +---------+---------------+---------+-----------+----------+--------------+ LEFT     CompressibilityPhasicitySpontaneityPropertiesThrombus Aging +---------+---------------+---------+-----------+----------+--------------+ CFV      Full           Yes      Yes                                 +---------+---------------+---------+-----------+----------+--------------+ SFJ      Full                                                        +---------+---------------+---------+-----------+----------+--------------+  FV Prox  Full                                                        +---------+---------------+---------+-----------+----------+--------------+ FV Mid   Full                                                        +---------+---------------+---------+-----------+----------+--------------+ FV DistalFull                                                        +---------+---------------+---------+-----------+----------+--------------+ PFV      Full                                                        +---------+---------------+---------+-----------+----------+--------------+ POP      Full           Yes      Yes                                  +---------+---------------+---------+-----------+----------+--------------+ PTV      Full                                                        +---------+---------------+---------+-----------+----------+--------------+ PERO     Full                                                        +---------+---------------+---------+-----------+----------+--------------+     Summary: RIGHT: - There is no evidence of deep vein thrombosis in the lower extremity.  - No cystic structure found in the popliteal fossa.  LEFT: - There is no evidence of deep vein thrombosis in the lower extremity.  - No cystic structure found in the popliteal fossa.  *See table(s) above for measurements and observations. Electronically signed by Deitra Mayo MD on 03/02/2022 at 4:46:08 PM.    Final    MR ANGIO HEAD WO CONTRAST  Result Date: 03/02/2022 CLINICAL DATA:  Punctate acute infarct on 03/01/2022 MRI EXAM: MRA HEAD WITHOUT CONTRAST TECHNIQUE: Angiographic images of the Circle of Willis were acquired using MRA technique without intravenous contrast. COMPARISON:  No prior MRA, correlation is made with 03/01/2022 MRI head and 03/01/2022 CTA head and FINDINGS: Anterior circulation: Both internal carotid arteries are patent to the termini, without significant stenosis. A1 segments patent. Normal anterior communicating artery. Anterior cerebral arteries are patent to their distal aspects. No M1 stenosis or occlusion. Normal MCA bifurcations.  Distal MCA branches perfused and symmetric. Posterior circulation: Vertebral arteries patent to the vertebrobasilar junction without stenosis. Posterior inferior cerebral arteries patent bilaterally. Basilar patent to its distal aspect. Superior cerebellar arteries patent bilaterally. Patent P1 segments. PCAs perfused to their distal aspects without stenosis. The right posterior communicating artery is patent. Anatomic variants: None significant IMPRESSION: No  intracranial large vessel occlusion or hemodynamically significant stenosis. Electronically Signed   By: Merilyn Baba M.D.   On: 03/02/2022 15:20   MR Brain W and Wo Contrast  Result Date: 03/01/2022 CLINICAL DATA:  Initial evaluation for neuro deficit, stroke suspected. EXAM: MRI HEAD WITHOUT AND WITH CONTRAST TECHNIQUE: Multiplanar, multiecho pulse sequences of the brain and surrounding structures were obtained without and with intravenous contrast. CONTRAST:  58m GADAVIST GADOBUTROL 1 MMOL/ML IV SOLN COMPARISON:  Prior CTs from earlier the same day. FINDINGS: Brain: Cerebral volume within normal limits. Scattered patchy T2/FLAIR hyperintensity involving the periventricular deep white matter both cerebral hemispheres as well as the pons, most characteristic of chronic microvascular ischemic disease, moderately advanced for age. Punctate acute ischemic infarcts seen involving the cortical gray matter of the left temporoparietal region (series 8, image 27). No associated hemorrhage or mass effect. No other evidence for acute or subacute ischemia. Gray-white matter differentiation otherwise maintained. No areas of chronic cortical infarction. No acute or chronic intracranial blood products. Enhancing right paraclinoid mass again seen, measuring 1.9 x 1.0 x 1.9 cm, most characteristic of a meningioma. No associated edema or mass effect. No other mass lesion or midline shift. No other abnormal enhancement. No hydrocephalus or extra-axial fluid collection. Pituitary gland suprasellar region within normal limits. Vascular: Major intracranial vascular flow voids are maintained. Skull and upper cervical spine: Craniocervical junction within normal limits. Bone marrow signal intensity normal. No scalp soft tissue abnormality. Sinuses/Orbits: Globes orbital soft tissues demonstrate no acute finding. Paranasal sinuses are largely clear. No significant mastoid effusion. Other: None. IMPRESSION: 1. Punctate acute ischemic  nonhemorrhagic infarct involving the cortical gray matter of the left temporoparietal region. 2. Underlying moderately advanced chronic microvascular ischemic disease. 3. 1.9 x 1.0 x 1.9 cm enhancing right paraclinoid mass, most characteristic of a meningioma. No associated edema or mass effect. Electronically Signed   By: BJeannine BogaM.D.   On: 03/01/2022 23:28        Scheduled Meds:   stroke: early stages of recovery book   Does not apply Once   aspirin EC  81 mg Oral Daily   Or   aspirin  300 mg Rectal Daily   atorvastatin  40 mg Oral Daily   clopidogrel  75 mg Oral Daily   enoxaparin (LOVENOX) injection  40 mg Subcutaneous Q24H   potassium chloride  40 mEq Oral Q4H   Continuous Infusions:  sodium chloride Stopped (03/03/22 0600)     LOS: 1 day    Time spent: 35 minutes    DIrine Seal MD [facility]   To contact the attending provider between 7A-7P or the covering provider during after hours 7P-7A, please log into the web site [email or web address] and access using universal [place] password for that web site. If you do not have the password, please call the hospital operator.  03/03/2022, 12:31 PM

## 2022-03-03 NOTE — TOC Initial Note (Signed)
Transition of Care [facility]) - Initial/Assessment Note    Patient Details  Name: Joanne Gonzalez MRN: [id number] Date of Birth: 1966/01/14  [facility]) CM/SW Contact:    Leeroy Cha, RN Phone Number: 03/03/2022, 8:05 AM  Clinical Narrative:                 Pt with new cva left sided weakness and facial drooping.  May need snf for rehab.  Will follow course of stay.  Expected Discharge Plan: Skilled Nursing Facility Barriers to Discharge: Continued Medical Work up   Patient Goals and CMS Choice Patient states their goals for this hospitalization and ongoing recovery are:: i do not know what to do right now CMS Medicare.gov Compare Post Acute Care list provided to:: Patient Choice offered to / list presented to : Patient  Expected Discharge Plan and Services Expected Discharge Plan: [place]   Discharge Planning Services: CM Consult   Living arrangements for the past 2 months: Single Family Home                                      Prior Living Arrangements/Services Living arrangements for the past 2 months: Single Family Home Lives with:: Self Patient language and need for interpreter reviewed:: Yes Do you feel safe going back to the place where you live?: Yes            Criminal Activity/Legal Involvement Pertinent to Current Situation/Hospitalization: No - Comment as needed  Activities of Daily Living      Permission Sought/Granted                  Emotional Assessment Appearance:: Appears stated age Attitude/Demeanor/Rapport: Gracious Affect (typically observed): Calm Orientation: : Oriented to Self, Oriented to Place, Oriented to  Time, Oriented to Situation Alcohol / Substance Use: Tobacco Use, Alcohol Use, Illicit Drugs (former smoker,  no current etoh use, former cocaine and marijuana use.) Psych Involvement: No (comment)  Admission diagnosis:  TIA (transient ischemic attack) [G45.9] Stroke 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[place]) 04/14/2021   Adjustment disorder with mixed anxiety and depressed mood 04/13/2021   Cocaine abuse ([place]) 04/13/2021   Crack cocaine use 04/10/2021   Prediabetes 03/09/2021   COVID-19 02/26/2021   History of trichomoniasis 02/26/2021   Substance induced mood disorder ([place]) 02/05/2021   Dysmenorrhea 11/25/2020   Psychophysiological insomnia 11/11/2020   Elevated LDL cholesterol level 11/11/2020   Hypokalemia 11/11/2020   Chronic pain of both hips 11/11/2020   Vitamin D deficiency 11/11/2020   Trichimoniasis 11/11/2020   Stroke (cerebrum) ([place]) 02/10/2019   Hypocalcemia 02/10/2019   History of seizures 02/10/2019   Homelessness 11/26/2018   Adjustment disorder with depressed mood 11/26/2018   Substance abuse ([place])    Insect bite of left leg, initial encounter 06/24/2018   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   Tobacco use 01/21/2016   H/O: CVA (cerebrovascular accident) 09/26/2015   Chest pain 09/26/2015   Chronic post-traumatic stress disorder (PTSD) 11/14/2014   Acute cystitis without hematuria 11/12/2014   Iron deficiency anemia 09/01/2014   Hyperglycemia 09/01/2014   Undifferentiated schizophrenia ([place]) 08/31/2014   Left-sided weakness 08/30/2014   History  of diabetes mellitus, type II 11/21/2013   Hemiplegia, unspecified, affecting nondominant side 04/13/2013   PCP:  Pcp, No Pharmacy:   [facility], [address] [place] [address] Phone: [phone] Fax: [phone]     Social Determinants of Health (SDOH) Interventions    Readmission Risk Interventions   No data to display

## 2022-03-03 NOTE — Evaluation (Signed)
Occupational Therapy Evaluation Patient Details Name: Joanne Gonzalez MRN: [id number] DOB: 29-Nov-1965 Today's Date: 03/03/2022   History of Present Illness Joanne Gonzalez is a 56 year old female who presented with left sided weakness. MRI revealed "Punctate acute ischemic nonhemorrhagic infarct involving the cortical gray matter of the left temporoparietal region.1.9 x 1.0 x 1.9 cm enhancing right paraclinoid mass, most characteristic of a meningioma." PMH: conversion disorder, schizophrenia, seizures, CVA, depression, PTSD, substance abuse.   Clinical Impression   Patient is a 56 year old female who was admitted for above. Patient reported living at home alone prior level. Currently, assessment was difficult with some inconsistencies on ability levels during session noted. Patient declined to attempt standing or other ADL tasks OOB with reported of not feeling well with increased agitation with encouragement. Patient has unclear level of assistance at home and without ability to assess transfers would be difficult to make accurate recommendations for d/c at this time. Patient will need 24/7 caregiver assist in next level of care if patient is unable to engage in time out of bed. Patient was noted to have decreased use of LUE with inconsistent functional levels of UE during session. Patient was min guard functionally for bed mobility even with the L side deficits.  OT to continue to follow and update recommendations as appropriate.      Recommendations for follow up therapy are one component of a multi-disciplinary discharge planning process, led by the attending physician.  Recommendations may be updated based on patient status, additional functional criteria and insurance authorization.   Follow Up Recommendations  Other (comment) (unclear)     Assistance Recommended at Discharge Frequent or constant Supervision/Assistance  Patient can return home with the following A little help with  bathing/dressing/bathroom;A lot of help with walking and/or transfers;Direct supervision/assist for financial management;Help with stairs or ramp for entrance;Direct supervision/assist for medications management;Assist for transportation;Assistance with cooking/housework    Functional Status Assessment  Patient has had a recent decline in their functional status and demonstrates the ability to make significant improvements in function in a reasonable and predictable amount of time.  Equipment Recommendations  Other (comment) (TBD)    Recommendations for Other Services       Precautions / Restrictions Precautions Precautions: Fall Restrictions Weight Bearing Restrictions: No      Mobility Bed Mobility Overal bed mobility: Needs Assistance Bed Mobility: Supine to Sit, Sit to Supine     Supine to sit: Min guard Sit to supine: Min guard   General bed mobility comments: max encouragement and sequencing, pt needing increased time, able to adduct LLE to EOB with increased time, able to lift and abduct LLE to position of comfort when returning to supine    Transfers                   General transfer comment: patient declined any attempts at standing continuing to report she did not feel well.      Balance Overall balance assessment: Needs assistance Sitting-balance support: Feet supported Sitting balance-Leahy Scale: Good Sitting balance - Comments: seated EOB                 ADL either performed or assessed with clinical judgement   ADL Overall ADL's : Needs assistance/impaired Eating/Feeding: Set up;Sitting Eating/Feeding Details (indicate cue type and reason): patient able to complete tasks with RUE consistently and noted to have movements of LUE during session inconsistently. Grooming: Minimal assistance;Sitting   Upper Body Bathing: Minimal assistance;Sitting   Lower  Body Bathing: Moderate assistance;Sit to/from stand;Sitting/lateral leans   Upper Body  Dressing : Minimal assistance;Sitting   Lower Body Dressing: Moderate assistance;Sitting/lateral leans;Sit to/from stand Lower Body Dressing Details (indicate cue type and reason): patient needed min A to don socks over toes with patient able to pull up the rest of the way with only RUE. noted to be able to hold sock and manipulate in BUE on lap to orient sock appropriately prior to first time donning sock.   Toilet Transfer Details (indicate cue type and reason): patient declined to attempt at this time.   Toileting - Clothing Manipulation Details (indicate cue type and reason): patient declined to simulate at this time.             Vision Baseline Vision/History: 1 Wears glasses Vision Assessment?: No apparent visual deficits     Perception     Praxis      Pertinent Vitals/Pain Pain Assessment Pain Assessment: Faces Faces Pain Scale: Hurts even more Pain Location: generalized pain Pain Descriptors / Indicators: Guarding Pain Intervention(s): Limited activity within patient's tolerance, Monitored during session     Hand Dominance Right   Extremity/Trunk Assessment Upper Extremity Assessment Upper Extremity Assessment: RUE deficits/detail;LUE deficits/detail LUE Deficits / Details: noted to hold in sling position at rest. patient was noted to have moments of being able to move UE more than when participating in MMT. patient was able to reach out and collect papers from nurse with LUE and active movements of thumb consistent during session. patient was able to use LUE to pick up LLE to get it back into bed when patient transitioned herself back to supine during session.   Lower Extremity Assessment Lower Extremity Assessment: Defer to PT evaluation RLE Deficits / Details: AROM WFL, MMT: hip flexion 3+/5, hip abduction 3+/5, hip adduction 3+/5, knee flexion 3+/5, ankle 3+/5 RLE Sensation: WNL RLE Coordination: WNL LLE Deficits / Details: Difficult to assess; MMT: hip flexion  2-/5, hip adduction 2-/5, 0/5 for hip abduction, knee flexion and knee extension. Pt reports "feels different" when checking sensation throughout LLE circumferentially, unable to determine specific areas of concern regarding sensation. Functionally, pt able to mobilize LLE to EOB and lift back into bed with bed mobility       Communication Communication Communication: No difficulties   Cognition Arousal/Alertness: Awake/alert Behavior During Therapy: Flat affect Overall Cognitive Status: Difficult to assess                   General Comments: Patient was noted to shut down with questions on orientation. patient was minimally conversational with consistent reports of not felling well. inconsistencies noted with abilites during session. able to follow commands with increased time.                Home Living Family/patient expects to be discharged to:: Unsure   Available Help at Discharge: Friend(s);Available PRN/intermittently              Prior Functioning/Environment Prior Level of Function : Independent/Modified Independent             Mobility Comments: pt reports being ind prior to this hospitalization ADLs Comments: patient reported being independent prior level        OT Problem List: Decreased activity tolerance;Impaired balance (sitting and/or standing);Decreased coordination;Decreased safety awareness;Decreased knowledge of use of DME or AE;Impaired UE functional use      OT Treatment/Interventions: Self-care/ADL training;Energy conservation;Therapeutic exercise;DME and/or AE instruction;Neuromuscular education;Manual therapy;Therapeutic activities;Patient/family education;Balance training    OT  Goals(Current goals can be found in the care plan section) Acute Rehab OT Goals Patient Stated Goal: none stated OT Goal Formulation: Patient unable to participate in goal setting Time For Goal Achievement: 03/17/22 Potential to Achieve Goals: Fair  OT  Frequency: Min 2X/week    Co-evaluation PT/OT/SLP Co-Evaluation/Treatment: Yes Reason for Co-Treatment: Necessary to address cognition/behavior during functional activity;For patient/therapist safety PT goals addressed during session: Mobility/safety with mobility;Balance;Strengthening/ROM OT goals addressed during session: ADL's and self-care      AM-PAC OT "6 Clicks" Daily Activity     Outcome Measure Help from another person eating meals?: A Little Help from another person taking care of personal grooming?: A Little Help from another person toileting, which includes using toliet, bedpan, or urinal?: A Lot Help from another person bathing (including washing, rinsing, drying)?: A Lot Help from another person to put on and taking off regular upper body clothing?: A Lot Help from another person to put on and taking off regular lower body clothing?: A Lot 6 Click Score: 14   End of Session Nurse Communication: Other (comment) (inconsistencies during session)  Activity Tolerance: Patient tolerated treatment well Patient left: in bed;with bed alarm set;with call bell/phone within reach  OT Visit Diagnosis: Unsteadiness on feet (R26.81);Other abnormalities of gait and mobility (R26.89);Muscle weakness (generalized) (M62.81)                Time: 7209-4709 OT Time Calculation (min): 14 min Charges:  OT General Charges $OT Visit: 1 Visit OT Evaluation $OT Eval Moderate Complexity: 1 Mod  Mikko Lewellen OTR/L, MS Acute Rehabilitation Department Office# [phone]   Willa Rough 03/03/2022, 1:46 PM

## 2022-03-04 ENCOUNTER — Inpatient Hospital Stay (HOSPITAL_COMMUNITY): Payer: Medicaid Other

## 2022-03-04 DIAGNOSIS — I6389 Other cerebral infarction: Secondary | ICD-10-CM

## 2022-03-04 DIAGNOSIS — I639 Cerebral infarction, unspecified: Secondary | ICD-10-CM | POA: Diagnosis not present

## 2022-03-04 DIAGNOSIS — F1910 Other psychoactive substance abuse, uncomplicated: Secondary | ICD-10-CM | POA: Diagnosis not present

## 2022-03-04 DIAGNOSIS — F4321 Adjustment disorder with depressed mood: Secondary | ICD-10-CM | POA: Diagnosis not present

## 2022-03-04 DIAGNOSIS — F449 Dissociative and conversion disorder, unspecified: Secondary | ICD-10-CM | POA: Diagnosis not present

## 2022-03-04 LAB — ECHOCARDIOGRAM COMPLETE
AR max vel: 2.46 cm2
AV Area VTI: 2.73 cm2
AV Area mean vel: 2.38 cm2
AV Mean grad: 5 mmHg
AV Peak grad: 9.2 mmHg
Ao pk vel: 1.52 m/s
Area-P 1/2: 2.69 cm2
MV VTI: 3.24 cm2
S' Lateral: 2.5 cm
Weight: 2496 oz

## 2022-03-04 LAB — CBC
HCT: 39.2 % (ref 36.0–46.0)
Hemoglobin: 12.5 g/dL (ref 12.0–15.0)
MCH: 30.9 pg (ref 26.0–34.0)
MCHC: 31.9 g/dL (ref 30.0–36.0)
MCV: 96.8 fL (ref 80.0–100.0)
Platelets: 276 10*3/uL (ref 150–400)
RBC: 4.05 MIL/uL (ref 3.87–5.11)
RDW: 14.6 % (ref 11.5–15.5)
WBC: 7.6 10*3/uL (ref 4.0–10.5)
nRBC: 0 % (ref 0.0–0.2)

## 2022-03-04 LAB — BASIC METABOLIC PANEL
Anion gap: 10 (ref 5–15)
BUN: 13 mg/dL (ref 6–20)
CO2: 21 mmol/L — ABNORMAL LOW (ref 22–32)
Calcium: 8.8 mg/dL — ABNORMAL LOW (ref 8.9–10.3)
Chloride: 107 mmol/L (ref 98–111)
Creatinine, Ser: 0.78 mg/dL (ref 0.44–1.00)
GFR, Estimated: >60 mL/min (ref >60)
Glucose, Bld: 134 mg/dL — ABNORMAL HIGH (ref 70–99)
Potassium: 3.9 mmol/L (ref 3.5–5.1)
Sodium: 138 mmol/L (ref 135–145)

## 2022-03-04 MED ORDER — LIP MEDEX EX OINT
TOPICAL_OINTMENT | CUTANEOUS | Status: DC | PRN
  Administered 2022-03-04: 75 via TOPICAL
  Filled 2022-03-04: qty 7

## 2022-03-04 MED ORDER — MELATONIN 5 MG PO TABS
5.0000 mg | ORAL_TABLET | Freq: Every evening | ORAL | Status: AC | PRN
  Administered 2022-03-04 – 2022-03-05 (×2): 5 mg via ORAL
  Filled 2022-03-04 (×2): qty 1

## 2022-03-04 MED ORDER — BENZTROPINE MESYLATE 0.5 MG PO TABS: 0.5000 mg | ORAL_TABLET | Freq: Two times a day (BID) | ORAL | Status: DC | PRN

## 2022-03-04 MED ORDER — GABAPENTIN 300 MG PO CAPS
300.0000 mg | ORAL_CAPSULE | Freq: Every day | ORAL | Status: DC
  Administered 2022-03-04 – 2022-03-10 (×7): 300 mg via ORAL
  Filled 2022-03-04 (×7): qty 1

## 2022-03-04 MED ORDER — QUETIAPINE FUMARATE 25 MG PO TABS
25.0000 mg | ORAL_TABLET | Freq: Every day | ORAL | Status: DC
  Administered 2022-03-04: 25 mg via ORAL
  Filled 2022-03-04: qty 1

## 2022-03-04 MED ORDER — PANTOPRAZOLE SODIUM 40 MG PO TBEC
40.0000 mg | DELAYED_RELEASE_TABLET | Freq: Every day | ORAL | Status: DC
  Administered 2022-03-04 – 2022-03-19 (×16): 40 mg via ORAL
  Filled 2022-03-04 (×16): qty 1

## 2022-03-04 MED ORDER — FERROUS SULFATE 325 (65 FE) MG PO TABS
325.0000 mg | ORAL_TABLET | Freq: Every day | ORAL | Status: DC
  Administered 2022-03-05 – 2022-03-19 (×15): 325 mg via ORAL
  Filled 2022-03-04 (×13): qty 1

## 2022-03-04 NOTE — Progress Notes (Incomplete)
*  PRELIMINARY RESULTS* Echocardiogram 2D Echocardiogram has been performed.  Joanne Gonzalez 03/04/2022, 3:06 PM

## 2022-03-04 NOTE — Progress Notes (Addendum)
PROGRESS NOTE    Joanne Gonzalez  ERX:[id number] DOB: [birth year] DOA: 03/01/2022 PCP: Pcp, No    Chief Complaint  Patient presents with   Cerebrovascular Accident    Brief Narrative:  56 year old female with a history of conversion disorder.  She presented multiple times 11/16/2019 with complaint of left-sided weakness thought to be functional.  She again presents today with complaints of left-sided weakness.  She has left-sided aural facial droop and lower extremity, noted since day prior to visit. Here for stroke w/u     Assessment & Plan:   Principal Problem:   TIA (transient ischemic attack) Active Problems:   Undifferentiated schizophrenia ([place])   Homelessness   Adjustment disorder with depressed mood   Substance abuse ([place])   History of seizures   Cocaine abuse ([place])   Chronic post-traumatic stress disorder (PTSD)   Conversion disorder   Tobacco use   Stroke ([place])   Acute CVA (cerebrovascular accident) ([place])  #1 left temporal frontal region CVA/ Left sided weakness -Patient presented with left facial droop left-sided weakness. -CT head done negative for any acute abnormalities. -CT angiogram head and neck done with no emergent LVO or approximately hemodynamic significant stenosis.  Right paraclinoid mass which measures approximately 2.2 x 1.9 cm most likely meningioma.  Normal CT perfusion noted no evidence of penumbra or core infarct. -CT perfusion done normal, no evidence of penumbra or core infarct. -MRI brain with punctuate acute ischemic nonhemorrhagic infarct involving the cortical gray matter of the left temporoparietal region.  Underlying moderately advanced chronic microvascular ischemic disease.  1.9 x 1.0 x 1.9 cm enhancing right paraclinoid mass most characteristic of a meningioma no associated edema or mass effect. -Lower extremity Dopplers negative for DVT. -MRA brain with no acute abnormalities. -LDL noted at 79. -2D echo pending.  -Patient seen in  consultation by neurology who feel incidental acute ischemic stroke and patient noted with functional left-sided weakness which does not correspond to patient's symptoms and recommending atorvastatin 40 mg daily, aspirin 81 mg daily and Plavix 75 mg daily for secondary stroke prophylaxis. -Patient noted to currently be out of the window for permissive hypertension. -PT/OT/SLP. -TOC consulted for SNF placement.  2.  Substance abuse/cocaine abuse -Cessation stressed to patient.  3.  History of schizophrenia/adjustment disorder with depressed mood/PTSD -Seroquel, Risperdal, Neurontin, mirtazapine and trazodone initially held on presentation due to concern that this may affect patient's neuro examinations. -Will resume Seroquel at 25 mg nightly..  4.  Conversion disorder -Follow-up.  5.  Tobacco use Tobacco cessation. -Nicotine patch.  6.  Hypokalemia -Repleted.  Potassium at 3.9.   DVT prophylaxis: Lovenox Code Status: Full Family Communication: Updated patient.  No family at bedside. Disposition: Medically stable.  Awaiting SNF  Status is: Inpatient Remains inpatient appropriate because: Unsafe disposition.   Consultants:  Neuro hospitalist: Dr. Leonel Ramsay 03/01/2022  Procedures:  2D echo pending Ct angiogram head and neck 03/01/2022 CT cerebral perfusion 03/01/2022 MRI brain 03/01/2022 MRA head 03/02/2022 Lower extremity Dopplers 03/02/2022 CT head without contrast 03/01/2022  Antimicrobials:  Anti-infectives (From admission, onward)    None         Subjective: Laying in bed.  Still with complaints of left-sided weakness and left facial pain.  States he is having to left her left upper extremity with the right arm to do exercises.    Objective: Vitals:   03/03/22 0832 03/03/22 1533 03/03/22 1955 03/04/22 0427  BP: 122/82 114/64 118/72 135/82  Pulse: 85 83 82 78  Resp: 20 (!)  $'9 18 20  'R$ Temp: 98.9 F (37.2 C) 99.5 F (37.5 C) 98.4 F (36.9 C) 98.4 F (36.9 C)   TempSrc: Oral Oral Oral   SpO2: 96% 98% 95% 98%  Weight:        Intake/Output Summary (Last 24 hours) at 03/04/2022 1204 Last data filed at 03/04/2022 0200 Gross per 24 hour  Intake 170 ml  Output --  Net 170 ml    Filed Weights   03/01/22 1124  Weight: 70.8 kg    Examination:  General exam: NAD. Respiratory system: Lungs clear to auscultation bilaterally.  No wheezes, no crackles, no rhonchi.  Fair air movement.  Speaking in full sentences.  Cardiovascular system: Regular rate rhythm no murmurs rubs or gallops.  No JVD.  No lower extremity edema.  Gastrointestinal system: Abdomen is soft, nontender, nondistended, positive bowel sounds.  No rebound.  No guarding.  Central nervous system: Alert and oriented. ?  Left facial weakness however when patient is distracted no facial weakness noted.  Patient with complaints of left lower extremity and left upper extremity weakness however when distracted patient seems to be moving left extremities.   Extremities: Symmetric 5 x 5 power. Skin: No rashes, lesions or ulcers Psychiatry: Judgement and insight appear normal. Mood & affect appropriate.     Data Reviewed: I have personally reviewed following labs and imaging studies  CBC: Recent Labs  Lab 03/01/22 1142 03/02/22 0456 03/04/22 0527  WBC 9.4 8.9 7.6  NEUTROABS 6.6  --   --   HGB 14.2 13.9 12.5  HCT 43.9 43.4 39.2  MCV 93.4 95.8 96.8  PLT 338 307 276     Basic Metabolic Panel: Recent Labs  Lab 03/01/22 1142 03/02/22 0456 03/03/22 1002 03/04/22 0527  NA 141 137 137 138  K 3.7 3.4* 3.2* 3.9  CL 108 108 107 107  CO2 22 20* 21* 21*  GLUCOSE 138* 114* 160* 134*  BUN '7 11 8 13  '$ CREATININE 0.67 0.94 0.86 0.78  CALCIUM 9.2 9.2 8.8* 8.8*  MG  --   --  1.9  --      GFR: Estimated Creatinine Clearance: 79.3 mL/min (by C-G formula based on SCr of 0.78 mg/dL).  Liver Function Tests: Recent Labs  Lab 03/01/22 1142 03/02/22 0456  AST 19 17  ALT 15 13  ALKPHOS  87 76  BILITOT 0.6 0.2*  PROT 8.2* 7.3  ALBUMIN 3.9 3.3*     CBG: Recent Labs  Lab 03/01/22 1123  GLUCAP 145*      No results found for this or any previous visit (from the past 240 hour(s)).       Radiology Studies: VAS [place] LOWER EXTREMITY VENOUS (DVT)  Result Date: 03/02/2022  Lower Venous DVT Study Patient Name:  DONNIKA KUCHER  Date of Exam:   03/02/2022 Medical Rec #: [id number]      Accession #:    [id number] Date of Birth: 1965-05-11     Patient Gender: F Patient Age:   39 years Exam Location:  [facility] Procedure:      VAS [place] LOWER EXTREMITY VENOUS (DVT) Referring Phys: Elwin Sleight DE LA TORRE --------------------------------------------------------------------------------  Indications: Stroke.  Risk Factors: None identified. Limitations: Poor ultrasound/tissue interface and patient positioning. Comparison Study: No prior studies. Performing Technologist: Oliver Hum RVT  Examination Guidelines: A complete evaluation includes B-mode imaging, spectral Doppler, color Doppler, and power Doppler as needed of all accessible portions of each vessel. Bilateral testing is considered an integral part of a  complete examination. Limited examinations for reoccurring indications may be performed as noted. The reflux portion of the exam is performed with the patient in reverse Trendelenburg.  +---------+---------------+---------+-----------+----------+--------------+ RIGHT    CompressibilityPhasicitySpontaneityPropertiesThrombus Aging +---------+---------------+---------+-----------+----------+--------------+ CFV      Full           Yes      Yes                                 +---------+---------------+---------+-----------+----------+--------------+ SFJ      Full                                                        +---------+---------------+---------+-----------+----------+--------------+ FV Prox  Full                                                         +---------+---------------+---------+-----------+----------+--------------+ FV Mid   Full                                                        +---------+---------------+---------+-----------+----------+--------------+ FV DistalFull                                                        +---------+---------------+---------+-----------+----------+--------------+ PFV      Full                                                        +---------+---------------+---------+-----------+----------+--------------+ POP      Full           Yes      Yes                                 +---------+---------------+---------+-----------+----------+--------------+ PTV      Full                                                        +---------+---------------+---------+-----------+----------+--------------+ PERO     Full                                                        +---------+---------------+---------+-----------+----------+--------------+   +---------+---------------+---------+-----------+----------+--------------+ LEFT     CompressibilityPhasicitySpontaneityPropertiesThrombus Aging +---------+---------------+---------+-----------+----------+--------------+ CFV      Full  Yes      Yes                                 +---------+---------------+---------+-----------+----------+--------------+ SFJ      Full                                                        +---------+---------------+---------+-----------+----------+--------------+ FV Prox  Full                                                        +---------+---------------+---------+-----------+----------+--------------+ FV Mid   Full                                                        +---------+---------------+---------+-----------+----------+--------------+ FV DistalFull                                                         +---------+---------------+---------+-----------+----------+--------------+ PFV      Full                                                        +---------+---------------+---------+-----------+----------+--------------+ POP      Full           Yes      Yes                                 +---------+---------------+---------+-----------+----------+--------------+ PTV      Full                                                        +---------+---------------+---------+-----------+----------+--------------+ PERO     Full                                                        +---------+---------------+---------+-----------+----------+--------------+     Summary: RIGHT: - There is no evidence of deep vein thrombosis in the lower extremity.  - No cystic structure found in the popliteal fossa.  LEFT: - There is no evidence of deep vein thrombosis in the lower extremity.  - No cystic structure found in the popliteal fossa.  *See table(s) above for measurements and observations. Electronically signed by Deitra Mayo MD on 03/02/2022 at 4:46:08  PM.    Final    MR ANGIO HEAD WO CONTRAST  Result Date: 03/02/2022 CLINICAL DATA:  Punctate acute infarct on 03/01/2022 MRI EXAM: MRA HEAD WITHOUT CONTRAST TECHNIQUE: Angiographic images of the Circle of Willis were acquired using MRA technique without intravenous contrast. COMPARISON:  No prior MRA, correlation is made with 03/01/2022 MRI head and 03/01/2022 CTA head and FINDINGS: Anterior circulation: Both internal carotid arteries are patent to the termini, without significant stenosis. A1 segments patent. Normal anterior communicating artery. Anterior cerebral arteries are patent to their distal aspects. No M1 stenosis or occlusion. Normal MCA bifurcations. Distal MCA branches perfused and symmetric. Posterior circulation: Vertebral arteries patent to the vertebrobasilar junction without stenosis. Posterior inferior cerebral arteries patent  bilaterally. Basilar patent to its distal aspect. Superior cerebellar arteries patent bilaterally. Patent P1 segments. PCAs perfused to their distal aspects without stenosis. The right posterior communicating artery is patent. Anatomic variants: None significant IMPRESSION: No intracranial large vessel occlusion or hemodynamically significant stenosis. Electronically Signed   By: Merilyn Baba M.D.   On: 03/02/2022 15:20        Scheduled Meds:  aspirin EC  81 mg Oral Daily   Or   aspirin  300 mg Rectal Daily   atorvastatin  40 mg Oral Daily   clopidogrel  75 mg Oral Daily   enoxaparin (LOVENOX) injection  40 mg Subcutaneous Q24H   Continuous Infusions:     LOS: 2 days    Time spent: 35 minutes    Irine Seal, MD [facility]   To contact the attending provider between 7A-7P or the covering provider during after hours 7P-7A, please log into the web site [email or web address] and access using universal  password for that web site. If you do not have the password, please call the hospital operator.  03/04/2022, 12:04 PM

## 2022-03-05 DIAGNOSIS — F1910 Other psychoactive substance abuse, uncomplicated: Secondary | ICD-10-CM | POA: Diagnosis not present

## 2022-03-05 DIAGNOSIS — I639 Cerebral infarction, unspecified: Secondary | ICD-10-CM | POA: Diagnosis not present

## 2022-03-05 DIAGNOSIS — F4321 Adjustment disorder with depressed mood: Secondary | ICD-10-CM | POA: Diagnosis not present

## 2022-03-05 DIAGNOSIS — F449 Dissociative and conversion disorder, unspecified: Secondary | ICD-10-CM | POA: Diagnosis not present

## 2022-03-05 MED ORDER — QUETIAPINE FUMARATE 100 MG PO TABS
100.0000 mg | ORAL_TABLET | Freq: Every day | ORAL | Status: DC
  Administered 2022-03-05 – 2022-03-18 (×14): 100 mg via ORAL
  Filled 2022-03-05 (×14): qty 1

## 2022-03-05 MED ORDER — AMLODIPINE BESYLATE 5 MG PO TABS
2.5000 mg | ORAL_TABLET | Freq: Every day | ORAL | Status: DC
  Administered 2022-03-05 – 2022-03-10 (×6): 2.5 mg via ORAL
  Filled 2022-03-05 (×6): qty 1

## 2022-03-05 NOTE — Progress Notes (Signed)
Physical Therapy Treatment Patient Details Name: Joanne Gonzalez MRN: [id number] DOB: Dec 19, 1965 Today's Date: 03/05/2022   History of Present Illness Joanne Gonzalez is a 56 year old female who presented with left sided weakness. MRI revealed "Punctate acute ischemic nonhemorrhagic infarct involving the cortical gray matter of the left temporoparietal region.1.9 x 1.0 x 1.9 cm enhancing right paraclinoid mass, most characteristic of a meningioma." PMH: conversion disorder, schizophrenia, seizures, CVA, depression, PTSD, substance abuse.    PT Comments    Pt agreeable to therapy with encouragement. Pt brings BLE over to EOB, 4 inch increments with breaks between, RUE pulling on bedrail and LUE not assisting. Once seated EOB, pt cued to bring LUE to side, educated on extra attention if unable to mobilize LUE. Pt powers to stand with RUE assisting, min A+2 for safety. Once standing, pt assists L hand to RW upright; then bil knee buckling to ~20 deg then able to return to full standing, occurs ~8 times then pt able to transfer to [facility] without knee buckling and bil feet able to clear floor to take a step over to [facility]. After voiding bladder and completing pericare, pt takes ~6 steps with RW, dragging L leg and not clearing L foot from floor for steps. Pt in recliner at [place], declines exercises despite education on therapeutic process. Pt with questionable effort with mobility, inconsistent LLE movements with mobility. Updated d/c rec to SNF due to pt needing assistance and unable to provide d/c home set up or assistance; educated pt on therapeutic process, unsure if pt willing to participate with therapy and go to SNF to regain strength and independence.   Recommendations for follow up therapy are one component of a multi-disciplinary discharge planning process, led by the attending physician.  Recommendations may be updated based on patient status, additional functional criteria and insurance  authorization.  Follow Up Recommendations  Skilled nursing-short term rehab (<3 hours/day)     Assistance Recommended at Discharge Frequent or constant Supervision/Assistance  Patient can return home with the following A little help with walking and/or transfers;A little help with bathing/dressing/bathroom;Assistance with cooking/housework;Assist for transportation   Equipment Recommendations  Other (comment) (TBD)    Recommendations for Other Services       Precautions / Restrictions Precautions Precautions: Fall Restrictions Weight Bearing Restrictions: No     Mobility  Bed Mobility Overal bed mobility: Needs Assistance Bed Mobility: Supine to Sit  Supine to sit: Min guard  General bed mobility comments: pt mobilizes BLE to EOB, sliding both legs in ~4" increments with rest breaks until clearing them off of bed, using R hand to pull on handrail, LUE not assisting    Transfers Overall transfer level: Needs assistance Equipment used: Rolling walker (2 wheels) Transfers: Sit to/from Stand, Bed to chair/wheelchair/BSC Sit to Stand: Min assist, +2 safety/equipment  Step pivot transfers: Min assist, +2 safety/equipment  General transfer comment: min A+2 to power to stand, cues for BLE engagement pushing into floor to power up. Pt using RUE to push from bed, self assists LUE to RW upright once up. Once standing, pt with bil knee buckling to ~20 deg knee flexion then returns to upright standing, happens ~8 times with static standing, then able to complete step pivot to [facility] without knee buckling noted and bil feet clearing floor.    Ambulation/Gait Ambulation/Gait assistance: Min assist, +2 safety/equipment  Assistive device: Rolling walker (2 wheels)  Gait velocity: decreased  General Gait Details: pt able to take ~6 steps from [facility] to recliner,  maintains L knee in extension and slides foot along the floor over to the recliner, no knee buckling noted   Stairs              Wheelchair Mobility    Modified Rankin (Stroke Patients Only)       Balance Overall balance assessment: Needs assistance  Sitting balance-Leahy Scale: Good Sitting balance - Comments: seated EOB  Standing balance support: During functional activity, Reliant on assistive device for balance, Bilateral upper extremity supported Standing balance-Leahy Scale: Fair     Cognition Arousal/Alertness: Awake/alert Behavior During Therapy: Flat affect Overall Cognitive Status: Difficult to assess  General Comments: Pt becomes tearful when discussing assist at home so discontinued questions. Pt follows commands. Pt minimally conversational, but able to make needs knwon. Inconsistencies noted with abilites during session        Exercises      General Comments        Pertinent Vitals/Pain Pain Assessment Pain Assessment: Faces Faces Pain Scale: Hurts even more Pain Location: generalized pain Pain Descriptors / Indicators: Guarding Pain Intervention(s): Limited activity within patient's tolerance, Monitored during session, Repositioned    Home Living                          Prior Function            PT Goals (current goals can now be found in the care plan section) Acute Rehab PT Goals Patient Stated Goal: none stated PT Goal Formulation: With patient Time For Goal Achievement: 03/17/22 Potential to Achieve Goals: Fair Progress towards PT goals: Progressing toward goals    Frequency    Min 2X/week      PT Plan Discharge plan needs to be updated    Co-evaluation              AM-PAC PT "6 Clicks" Mobility   Outcome Measure  Help needed turning from your back to your side while in a flat bed without using bedrails?: A Little Help needed moving from lying on your back to sitting on the side of a flat bed without using bedrails?: A Little Help needed moving to and from a bed to a chair (including a wheelchair)?: A Little Help needed standing up  from a chair using your arms (e.g., wheelchair or bedside chair)?: A Little Help needed to walk in hospital room?: Total Help needed climbing 3-5 steps with a railing? : Total 6 Click Score: 14    End of Session Equipment Utilized During Treatment: Gait belt Activity Tolerance: Patient tolerated treatment well Patient left: in chair;with call bell/phone within reach;with chair alarm set Nurse Communication: Mobility status PT Visit Diagnosis: Other abnormalities of gait and mobility (R26.89);Muscle weakness (generalized) (M62.81)     Time: 5397-6734 PT Time Calculation (min) (ACUTE ONLY): 22 min  Charges:  $Therapeutic Activity: 8-22 mins                      Tori Ritamarie Arkin PT, DPT 03/05/22, 1:10 PM

## 2022-03-05 NOTE — NC FL2 (Signed)
[place] LEVEL OF CARE FORM     IDENTIFICATION  Patient Name: Joanne Gonzalez Birthdate: 03/05/1966 Sex: female Admission Date (Current Location): 03/01/2022  [facility] and [place] Number:  Herbalist and Address:  [facility],  [address] [place], [place]      Provider Number: [id number]  Attending Physician Name and Address:  Eugenie Filler, MD  Relative Name and Phone Number:   (beasly,ronald Denman George)  [phone] (Mobile))    Current Level of Care: Hospital Recommended Level of Care: [place] Prior Approval Number:    Date Approved/Denied:   PASRR Number:    Discharge Plan: SNF    Current Diagnoses: Patient Active Problem List   Diagnosis Date Noted   Acute CVA (cerebrovascular accident) ([place]) 03/03/2022   TIA (transient ischemic attack) 03/02/2022   Stroke ([place]) 03/02/2022   Dysfunctional uterine bleeding 07/06/2021   Schizophrenia spectrum disorder with psychotic disorder type not yet determined ([place]) 04/14/2021   Adjustment disorder with mixed anxiety and depressed mood 04/13/2021   Cocaine abuse ([place]) 04/13/2021   Crack cocaine use 04/10/2021   Prediabetes 03/09/2021   COVID-19 02/26/2021   History of trichomoniasis 02/26/2021   Substance induced mood disorder ([place]) 02/05/2021   Dysmenorrhea 11/25/2020   Psychophysiological insomnia 11/11/2020   Elevated LDL cholesterol level 11/11/2020   Hypokalemia 11/11/2020   Chronic pain of both hips 11/11/2020   Vitamin D deficiency 11/11/2020   Trichimoniasis 11/11/2020   Stroke (cerebrum) ([place]) 02/10/2019   Hypocalcemia 02/10/2019   History of seizures 02/10/2019   Homelessness 11/26/2018   Adjustment disorder with depressed mood 11/26/2018   Substance abuse ([place])    Insect bite of left leg, initial encounter 06/24/2018   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   Tobacco use 01/21/2016   H/O: CVA (cerebrovascular accident) 09/26/2015   Chest pain  09/26/2015   Chronic post-traumatic stress disorder (PTSD) 11/14/2014   Acute cystitis without hematuria 11/12/2014   Iron deficiency anemia 09/01/2014   Hyperglycemia 09/01/2014   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   Left-sided weakness 08/30/2014   History of diabetes mellitus, type II 11/21/2013   Hemiplegia, unspecified, affecting nondominant side 04/13/2013    Orientation RESPIRATION BLADDER Height & Weight     Self, Time, Situation, Place  Normal External catheter Weight: 70.8 kg Height:     BEHAVIORAL SYMPTOMS/MOOD NEUROLOGICAL BOWEL NUTRITION STATUS      Continent Diet (CHO MOD)  AMBULATORY STATUS COMMUNICATION OF NEEDS Skin   Limited Assist Verbally Normal                       Personal Care Assistance Level of Assistance  Bathing, Feeding, Dressing Bathing Assistance: Limited assistance Feeding assistance: Limited assistance Dressing Assistance: Limited assistance     Functional Limitations Info  Sight, Hearing, Speech Sight Info: Adequate Hearing Info: Adequate Speech Info: Adequate    SPECIAL CARE FACTORS FREQUENCY  PT (By licensed PT), OT (By licensed OT)     PT Frequency:  (5x week) OT Frequency:  (5xweek)            Contractures Contractures Info: Not present    Additional Factors Info  Code Status, Allergies, Psychotropic Code Status Info:  (Full) Allergies Info:  (Keflex (Cephalexin), Penicillins, Risperdal (Risperidone), Bactrim (Sulfamethoxazole-trimethoprim), Lactose Intolerance (Gi)) Psychotropic Info:  (Cogentin)         Current Medications (03/05/2022):  This is the current hospital active medication list Current Facility-Administered Medications  Medication Dose Route Frequency Provider  Last Rate Last Admin   acetaminophen (TYLENOL) tablet 650 mg  650 mg Oral Q4H PRN Crosley, Debby, MD       Or   acetaminophen (TYLENOL) 160 MG/5ML solution 650 mg  650 mg Per Tube Q4H PRN Crosley, Debby, MD       Or    acetaminophen (TYLENOL) suppository 650 mg  650 mg Rectal Q4H PRN Crosley, Debby, MD       amLODipine (NORVASC) tablet 2.5 mg  2.5 mg Oral Daily Eugenie Filler, MD   2.5 mg at 03/05/22 3007   aspirin EC tablet 81 mg  81 mg Oral Daily de Yolanda Manges, Cortney E, NP   81 mg at 03/05/22 6226   Or   aspirin suppository 300 mg  300 mg Rectal Daily de Yolanda Manges, Cortney E, NP       atorvastatin (LIPITOR) tablet 40 mg  40 mg Oral Daily de Yolanda Manges, [place] E, NP   40 mg at 03/05/22 0953   benztropine (COGENTIN) tablet 0.5 mg  0.5 mg Oral BID PRN Eugenie Filler, MD       clopidogrel (PLAVIX) tablet 75 mg  75 mg Oral Daily de Yolanda Manges, Cortney E, NP   75 mg at 03/05/22 0953   enoxaparin (LOVENOX) injection 40 mg  40 mg Subcutaneous Q24H Crosley, Debby, MD   40 mg at 03/05/22 3335   ferrous sulfate tablet 325 mg  325 mg Oral Q breakfast Eugenie Filler, MD   325 mg at 03/05/22 0953   gabapentin (NEURONTIN) capsule 300 mg  300 mg Oral Daily Eugenie Filler, MD   300 mg at 03/05/22 4562   lip balm (CARMEX) ointment   Topical PRN Eugenie Filler, MD   75 Application at 56/38/93 2028   melatonin tablet 5 mg  5 mg Oral QHS PRN Raenette Rover, NP   5 mg at 03/04/22 2141   pantoprazole (PROTONIX) EC tablet 40 mg  40 mg Oral Daily Eugenie Filler, MD   40 mg at 03/05/22 0953   QUEtiapine (SEROQUEL) tablet 100 mg  100 mg Oral QHS Eugenie Filler, MD       senna-docusate (Senokot-S) tablet 1 tablet  1 tablet Oral QHS PRN Quintella Baton, MD         Discharge Medications: Please see discharge summary for a list of discharge medications.  Relevant Imaging Results:  Relevant Lab Results:   Additional Information  (ss#[phone])  Keeshia Sanderlin, Juliann Pulse, RN

## 2022-03-05 NOTE — TOC Progression Note (Signed)
Transition of Care [facility]) - Progression Note    Patient Details  Name: Joanne Gonzalez MRN: [id number] Date of Birth: Oct 20, 1965  Transition of Care [facility]) CM/SW Contact  Henrietta Dine, RN Phone Number: 03/05/2022, 4:39 PM  Clinical Narrative:    PT recc SNF; Must ID # [id number]; awaiting FL2 co-sign by MD before documents can be sent for level II PASSR; then fax out for bed offers.   Expected Discharge Plan: [place] Barriers to Discharge: Continued Medical Work up  Expected Discharge Plan and Services Expected Discharge Plan: [place]   Discharge Planning Services: CM Consult   Living arrangements for the past 2 months: Single Family Home                                       Social Determinants of Health (SDOH) Interventions    Readmission Risk Interventions    03/05/2022   12:11 PM  Readmission Risk Prevention Plan  Transportation Screening Complete  PCP or Specialist Appt within 3-5 Days Complete  HRI or [place] Complete  Social Work Consult for [place] Planning/Counseling Complete  Palliative Care Screening Not Applicable  Medication Review Press photographer) Complete

## 2022-03-05 NOTE — Progress Notes (Signed)
Responded to consult for IV. Noted pt has no IV medications ordered and no procedure ordered requiring IV. Discussed plan of care with RN, Dian Situ, who agreed to hold on IV placement until the need arises.

## 2022-03-05 NOTE — Progress Notes (Signed)
PROGRESS NOTE    Joanne Gonzalez  DQQ:[id number] DOB: [birth year] DOA: 03/01/2022 PCP: Pcp, No    Chief Complaint  Patient presents with   Cerebrovascular Accident    Brief Narrative:  56 year old female with a history of conversion disorder.  She presented multiple times 11/16/2019 with complaint of left-sided weakness thought to be functional.  She again presents today with complaints of left-sided weakness.  She has left-sided aural facial droop and lower extremity, noted since day prior to visit. Here for stroke w/u     Assessment & Plan:   Principal Problem:   TIA (transient ischemic attack) Active Problems:   Undifferentiated schizophrenia ([place])   Homelessness   Adjustment disorder with depressed mood   Substance abuse ([place])   History of seizures   Cocaine abuse ([place])   Chronic post-traumatic stress disorder (PTSD)   Conversion disorder   Tobacco use   Stroke ([place])   Acute CVA (cerebrovascular accident) ([place])  #1 left temporal frontal region CVA/ Left sided weakness -Patient presented with left facial droop left-sided weakness. -CT head done negative for any acute abnormalities. -CT angiogram head and neck done with no emergent LVO or approximately hemodynamic significant stenosis.  Right paraclinoid mass which measures approximately 2.2 x 1.9 cm most likely meningioma.  Normal CT perfusion noted no evidence of penumbra or core infarct. -CT perfusion done normal, no evidence of penumbra or core infarct. -MRI brain with punctuate acute ischemic nonhemorrhagic infarct involving the cortical gray matter of the left temporoparietal region.  Underlying moderately advanced chronic microvascular ischemic disease.  1.9 x 1.0 x 1.9 cm enhancing right paraclinoid mass most characteristic of a meningioma no associated edema or mass effect. -Lower extremity Dopplers negative for DVT. -MRA brain with no acute abnormalities. -LDL noted at 79. -2D echo pending.  -Patient seen in  consultation by neurology who feel incidental acute ischemic stroke and patient noted with functional left-sided weakness which does not correspond to patient's symptoms and recommending atorvastatin 40 mg daily, aspirin 81 mg daily and Plavix 75 mg daily for secondary stroke prophylaxis. -Patient noted to currently be out of the window for permissive hypertension. -Start Norvasc 2.5 mg daily and uptitrate as needed for goal systolic blood pressure < 130/80 -PT/OT/SLP. -TOC consulted for SNF placement.  2.  Substance abuse/cocaine abuse -Cessation stressed to patient.  3.  History of schizophrenia/adjustment disorder with depressed mood/PTSD -Seroquel, Neurontin, mirtazapine and trazodone initially held on presentation due to concern that this may affect patient's neuro examinations. -Seroquel resumed at 25 mg nightly, patient subsequently states was on 100 mg daily which is seen in the med rec and as such we will increase Seroquel to 100 mg daily.  -Resume Cogentin.  4.  Conversion disorder -Follow-up.  5.  Tobacco use Tobacco cessation. -Nicotine patch.  6.  Hypokalemia -Repleted.  Potassium at 3.9.   DVT prophylaxis: Lovenox Code Status: Full Family Communication: Updated patient.  No family at bedside. Disposition: Medically stable.  Awaiting SNF  Status is: Inpatient Remains inpatient appropriate because: Unsafe disposition.   Consultants:  Neuro hospitalist: Dr. Leonel Ramsay 03/01/2022  Procedures:  2D echo pending Ct angiogram head and neck 03/01/2022 CT cerebral perfusion 03/01/2022 MRI brain 03/01/2022 MRA head 03/02/2022 Lower extremity Dopplers 03/02/2022 CT head without contrast 03/01/2022  Antimicrobials:  Anti-infectives (From admission, onward)    None         Subjective: Laying in bed.  Still with complaints of left-sided weakness and left facial pain.  States he is having to  left her left upper extremity with the right arm to do exercises.     Objective: Vitals:   03/05/22 0415 03/05/22 0701 03/05/22 0953 03/05/22 1256  BP: (!) 142/85  (!) 140/80 138/84  Pulse: 84   76  Resp: (!) '21 20  18  '$ Temp: 98.8 F (37.1 C)     TempSrc: Oral     SpO2: 97%   98%  Weight:        Intake/Output Summary (Last 24 hours) at 03/05/2022 1304 Last data filed at 03/05/2022 0422 Gross per 24 hour  Intake 200 ml  Output 1400 ml  Net -1200 ml    Filed Weights   03/01/22 1124  Weight: 70.8 kg    Examination:  General exam: NAD. Respiratory system: CTAB.  No wheezes, no crackles, no rhonchi.  Fair air movement.  Speaking in full sentences.  Cardiovascular system: RRR no murmurs rubs or gallops.  No JVD.  No lower extremity edema.   Gastrointestinal system: Abdomen is soft, nontender, nondistended, positive bowel sounds.  No rebound.  No guarding.  Central nervous system: Alert and oriented. ?  Left facial weakness however when patient is distracted no facial weakness noted.  Patient with complaints of left lower extremity and left upper extremity weakness however when distracted patient seems to be moving left extremities.   Extremities: Symmetric 5 x 5 power. Skin: No rashes, lesions or ulcers Psychiatry: Judgement and insight appear normal. Mood & affect appropriate.     Data Reviewed: I have personally reviewed following labs and imaging studies  CBC: Recent Labs  Lab 03/01/22 1142 03/02/22 0456 03/04/22 0527  WBC 9.4 8.9 7.6  NEUTROABS 6.6  --   --   HGB 14.2 13.9 12.5  HCT 43.9 43.4 39.2  MCV 93.4 95.8 96.8  PLT 338 307 276     Basic Metabolic Panel: Recent Labs  Lab 03/01/22 1142 03/02/22 0456 03/03/22 1002 03/04/22 0527  NA 141 137 137 138  K 3.7 3.4* 3.2* 3.9  CL 108 108 107 107  CO2 22 20* 21* 21*  GLUCOSE 138* 114* 160* 134*  BUN '7 11 8 13  '$ CREATININE 0.67 0.94 0.86 0.78  CALCIUM 9.2 9.2 8.8* 8.8*  MG  --   --  1.9  --      GFR: Estimated Creatinine Clearance: 79.3 mL/min (by C-G formula based  on SCr of 0.78 mg/dL).  Liver Function Tests: Recent Labs  Lab 03/01/22 1142 03/02/22 0456  AST 19 17  ALT 15 13  ALKPHOS 87 76  BILITOT 0.6 0.2*  PROT 8.2* 7.3  ALBUMIN 3.9 3.3*     CBG: Recent Labs  Lab 03/01/22 1123  GLUCAP 145*      No results found for this or any previous visit (from the past 240 hour(s)).       Radiology Studies: ECHOCARDIOGRAM COMPLETE  Result Date: 03/04/2022    ECHOCARDIOGRAM REPORT   Patient Name:   RAYLAN TROIANI Date of Exam: 03/04/2022 Medical Rec #:  [id number]     Height:       65.0 in Accession #:    [id number]    Weight:       156.0 lb Date of Birth:  1965/10/18    BSA:          1.780 m Patient Age:    42 years      BP:           135/82 mmHg Patient Gender: F  HR:           71 bpm. Exam Location:  Inpatient Procedure: 2D Echo, Cardiac Doppler and Color Doppler Indications:    Stroke  History:        Patient has prior history of Echocardiogram examinations, most                 recent 02/10/2019. Stroke and TIA, Signs/Symptoms:Chest Pain;                 Risk Factors:Current Smoker and Diabetes. Polysubstance abuse.  Sonographer:    Wenda Low Referring Phys: [place]  1. Left ventricular ejection fraction, by estimation, is 60 to 65%. The left ventricle has normal function. The left ventricle has no regional wall motion abnormalities. There is mild concentric left ventricular hypertrophy. Left ventricular diastolic parameters are consistent with Grade I diastolic dysfunction (impaired relaxation).  2. Right ventricular systolic function is normal. The right ventricular size is normal. There is normal pulmonary artery systolic pressure.  3. The mitral valve is normal in structure. Trivial mitral valve regurgitation. No evidence of mitral stenosis.  4. The aortic valve is normal in structure. Aortic valve regurgitation is not visualized. No aortic stenosis is present.  5. The inferior vena cava is normal in  size with greater than 50% respiratory variability, suggesting right atrial pressure of 3 mmHg. FINDINGS  Left Ventricle: Left ventricular ejection fraction, by estimation, is 60 to 65%. The left ventricle has normal function. The left ventricle has no regional wall motion abnormalities. The left ventricular internal cavity size was normal in size. There is  mild concentric left ventricular hypertrophy. Left ventricular diastolic parameters are consistent with Grade I diastolic dysfunction (impaired relaxation). Right Ventricle: The right ventricular size is normal. No increase in right ventricular wall thickness. Right ventricular systolic function is normal. There is normal pulmonary artery systolic pressure. The tricuspid regurgitant velocity is 2.28 m/s, and  with an assumed right atrial pressure of 3 mmHg, the estimated right ventricular systolic pressure is 83.4 mmHg. Left Atrium: Left atrial size was normal in size. Right Atrium: Right atrial size was normal in size. Pericardium: There is no evidence of pericardial effusion. Mitral Valve: The mitral valve is normal in structure. Trivial mitral valve regurgitation. No evidence of mitral valve stenosis. MV peak gradient, 3.2 mmHg. The mean mitral valve gradient is 1.0 mmHg. Tricuspid Valve: The tricuspid valve is normal in structure. Tricuspid valve regurgitation is trivial. No evidence of tricuspid stenosis. Aortic Valve: The aortic valve is normal in structure. Aortic valve regurgitation is not visualized. No aortic stenosis is present. Aortic valve mean gradient measures 5.0 mmHg. Aortic valve peak gradient measures 9.2 mmHg. Aortic valve area, by VTI measures 2.73 cm. Pulmonic Valve: The pulmonic valve was normal in structure. Pulmonic valve regurgitation is not visualized. No evidence of pulmonic stenosis. Aorta: The aortic root is normal in size and structure. Venous: The inferior vena cava is normal in size with greater than 50% respiratory variability,  suggesting right atrial pressure of 3 mmHg. IAS/Shunts: No atrial level shunt detected by color flow Doppler.  LEFT VENTRICLE PLAX 2D LVIDd:         4.00 cm   Diastology LVIDs:         2.50 cm   LV e' medial:    5.11 cm/s LV PW:         1.30 cm   LV E/e' medial:  13.2 LV IVS:  1.20 cm   LV e' lateral:   6.53 cm/s LVOT diam:     2.10 cm   LV E/e' lateral: 10.4 LV SV:         87 LV SV Index:   49 LVOT Area:     3.46 cm  RIGHT VENTRICLE RV Basal diam:  3.55 cm RV Mid diam:    3.10 cm RV S prime:     17.70 cm/s TAPSE (M-mode): 1.8 cm LEFT ATRIUM           Index        RIGHT ATRIUM           Index LA diam:      3.60 cm 2.02 cm/m   RA Area:     18.50 cm LA Vol (A2C): 70.0 ml 39.33 ml/m  RA Volume:   49.00 ml  27.53 ml/m LA Vol (A4C): 38.3 ml 21.52 ml/m  AORTIC VALVE                     PULMONIC VALVE AV Area (Vmax):    2.46 cm      PV Vmax:       0.91 m/s AV Area (Vmean):   2.38 cm      PV Peak grad:  3.3 mmHg AV Area (VTI):     2.73 cm AV Vmax:           152.00 cm/s AV Vmean:          106.000 cm/s AV VTI:            0.318 m AV Peak Grad:      9.2 mmHg AV Mean Grad:      5.0 mmHg LVOT Vmax:         108.00 cm/s LVOT Vmean:        72.900 cm/s LVOT VTI:          0.251 m LVOT/AV VTI ratio: 0.79  AORTA Ao Root diam: 2.80 cm MITRAL VALVE               TRICUSPID VALVE MV Area (PHT): 2.69 cm    TR Peak grad:   20.8 mmHg MV Area VTI:   3.24 cm    TR Vmax:        228.00 cm/s MV Peak grad:  3.2 mmHg MV Mean grad:  1.0 mmHg    SHUNTS MV Vmax:       0.90 m/s    Systemic VTI:  0.25 m MV Vmean:      45.8 cm/s   Systemic Diam: 2.10 cm MV Decel Time: 282 msec MV E velocity: 67.70 cm/s MV A velocity: 84.40 cm/s MV E/A ratio:  0.80 Kardie Tobb DO Electronically signed by Berniece Salines DO Signature Date/Time: 03/04/2022/3:21:45 PM    Final         Scheduled Meds:  amLODipine  2.5 mg Oral Daily   aspirin EC  81 mg Oral Daily   Or   aspirin  300 mg Rectal Daily   atorvastatin  40 mg Oral Daily   clopidogrel  75 mg  Oral Daily   enoxaparin (LOVENOX) injection  40 mg Subcutaneous Q24H   ferrous sulfate  325 mg Oral Q breakfast   gabapentin  300 mg Oral Daily   pantoprazole  40 mg Oral Daily   Continuous Infusions:     LOS: 3 days    Time spent: 35 minutes    Irine Seal, MD [facility]  To contact the attending provider between 7A-7P or the covering provider during after hours 7P-7A, please log into the web site [email or web address] and access using universal [place] password for that web site. If you do not have the password, please call the hospital operator.  03/05/2022, 1:04 PM

## 2022-03-05 NOTE — TOC Progression Note (Signed)
Transition of Care [facility]) - Progression Note    Patient Details  Name: Joanne Gonzalez MRN: [id number] Date of Birth: 1965-11-29  Transition of Care [facility]) CM/SW Contact  Maleaha Hughett, Juliann Pulse, RN Phone Number: 03/05/2022, 3:53 PM  Clinical Narrative: Faxed out for [facility] SN[facility]. TOC to start level 2 pasrr.      Expected Discharge Plan: [place] Barriers to Discharge: Continued Medical Work up  Expected Discharge Plan and Services Expected Discharge Plan: [place]   Discharge Planning Services: CM Consult   Living arrangements for the past 2 months: Single Family Home                                       Social Determinants of Health (SDOH) Interventions    Readmission Risk Interventions    03/05/2022   12:11 PM  Readmission Risk Prevention Plan  Transportation Screening Complete  PCP or Specialist Appt within 3-5 Days Complete  HRI or [place] Complete  Social Work Consult for [place] Planning/Counseling Complete  Palliative Care Screening Not Applicable  Medication Review [employer]) Complete

## 2022-03-06 DIAGNOSIS — F449 Dissociative and conversion disorder, unspecified: Secondary | ICD-10-CM | POA: Diagnosis not present

## 2022-03-06 DIAGNOSIS — F4321 Adjustment disorder with depressed mood: Secondary | ICD-10-CM | POA: Diagnosis not present

## 2022-03-06 DIAGNOSIS — I639 Cerebral infarction, unspecified: Secondary | ICD-10-CM | POA: Diagnosis not present

## 2022-03-06 DIAGNOSIS — F1910 Other psychoactive substance abuse, uncomplicated: Secondary | ICD-10-CM | POA: Diagnosis not present

## 2022-03-06 LAB — BASIC METABOLIC PANEL
Anion gap: 9 (ref 5–15)
BUN: 15 mg/dL (ref 6–20)
CO2: 22 mmol/L (ref 22–32)
Calcium: 9.1 mg/dL (ref 8.9–10.3)
Chloride: 106 mmol/L (ref 98–111)
Creatinine, Ser: 0.76 mg/dL (ref 0.44–1.00)
GFR, Estimated: >60 mL/min (ref >60)
Glucose, Bld: 140 mg/dL — ABNORMAL HIGH (ref 70–99)
Potassium: 3.6 mmol/L (ref 3.5–5.1)
Sodium: 137 mmol/L (ref 135–145)

## 2022-03-06 NOTE — TOC Progression Note (Signed)
Transition of Care [facility]) - Progression Note    Patient Details  Name: Joanne Gonzalez MRN: [id number] Date of Birth: 1965-07-02  Transition of Care [facility]) CM/SW Contact  Henrietta Dine, RN Phone Number: 03/06/2022, 4:54 PM  Clinical Narrative:    Joanne Gonzalez signed by MD; additional requested documents sent to [place] [facility]; awaiting PASSR #.  Expected Discharge Plan: [place] Barriers to Discharge: Continued Medical Work up  Expected Discharge Plan and Services Expected Discharge Plan: [place]   Discharge Planning Services: CM Consult   Living arrangements for the past 2 months: Single Family Home                                       Social Determinants of Health (SDOH) Interventions    Readmission Risk Interventions    03/05/2022   12:11 PM  Readmission Risk Prevention Plan  Transportation Screening Complete  PCP or Specialist Appt within 3-5 Days Complete  HRI or [place] Complete  Social Work Consult for [place] Planning/Counseling Complete  Palliative Care Screening Not Applicable  Medication Review Press photographer) Complete

## 2022-03-06 NOTE — Plan of Care (Signed)
  Problem: Health Behavior/Discharge Planning: Goal: Goals will be collaboratively established with patient/family Outcome: Not Progressing   Problem: Self-Care: Goal: Ability to participate in self-care as condition permits will improve Outcome: Not Progressing

## 2022-03-06 NOTE — Progress Notes (Signed)
PROGRESS NOTE    Joanne Gonzalez  ITG:[id number] DOB: 1965/11/26 DOA: 03/01/2022 PCP: Pcp, No    Chief Complaint  Patient presents with   Cerebrovascular Accident    Brief Narrative:  56 year old female with a history of conversion disorder.  She presented multiple times 11/16/2019 with complaint of left-sided weakness thought to be functional.  She again presents today with complaints of left-sided weakness.  She has left-sided aural facial droop and lower extremity, noted since day prior to visit. Here for stroke w/u     Assessment & Plan:   Principal Problem:   TIA (transient ischemic attack) Active Problems:   Undifferentiated schizophrenia ([place])   Homelessness   Adjustment disorder with depressed mood   Substance abuse (Joanne Gonzalez)   History of seizures   Cocaine abuse (Joanne Gonzalez)   Chronic post-traumatic stress disorder (PTSD)   Conversion disorder   Tobacco use   Stroke (Joanne Gonzalez)   Acute CVA (cerebrovascular accident) (Joanne Gonzalez)  1 left temporal frontal region CVA/ Left sided weakness -Patient presented with left facial droop left-sided weakness. -CT head done negative for any acute abnormalities. -CT angiogram head and neck done with no emergent LVO or approximately hemodynamic significant stenosis.  Right paraclinoid mass which measures approximately 2.2 x 1.9 cm most likely meningioma.  Normal CT perfusion noted no evidence of penumbra or core infarct. -CT perfusion done normal, no evidence of penumbra or core infarct. -MRI brain with punctuate acute ischemic nonhemorrhagic infarct involving the cortical gray matter of the left temporoparietal region.  Underlying moderately advanced chronic microvascular ischemic disease.  1.9 x 1.0 x 1.9 cm enhancing right paraclinoid mass most characteristic of a meningioma no associated edema or mass effect. -Lower extremity Dopplers negative for DVT. -MRA brain with no acute abnormalities. -LDL noted at 79. -2D echo with EF 60 to 65%, NWMA, grade 1  diastolic dysfunction, no embolic source noted, no PFO noted. -Patient seen in consultation by neurology who feel incidental acute ischemic stroke and patient noted with functional left-sided weakness which does not correspond to patient's symptoms and recommending atorvastatin 40 mg daily, aspirin 81 mg daily and Plavix 75 mg daily for secondary stroke prophylaxis. -Patient noted to currently be out of the window for permissive hypertension. -Continue Norvasc 2.5 mg daily and uptitrate as needed for goal systolic blood pressure < 130/80 -PT/OT/SLP. -TOC consulted for SNF placement.  2.  Substance abuse/cocaine abuse -Cessation stressed to patient.  3.  History of schizophrenia/adjustment disorder with depressed mood/PTSD -Seroquel, Neurontin, mirtazapine and trazodone initially held on presentation due to concern that this may affect patient's neuro examinations. -Continue Seroquel 100 mg daily.   4.  Conversion disorder -Follow.  5.  Tobacco use Tobacco cessation. -Continue nicotine patch.  6.  Hypokalemia -Repleted.  Potassium at 3.6.   DVT prophylaxis: Lovenox Code Status: Full Family Communication: Updated patient.  No family at bedside. Disposition: Medically stable.  Awaiting SNF  Status is: Inpatient Remains inpatient appropriate because: Unsafe disposition.   Consultants:  Neuro hospitalist: Dr. Leonel Ramsay 03/01/2022  Procedures:  2D echo pending Ct angiogram head and neck 03/01/2022 CT cerebral perfusion 03/01/2022 MRI brain 03/01/2022 MRA head 03/02/2022 Lower extremity Dopplers 03/02/2022 CT head without contrast 03/01/2022  Antimicrobials:  Anti-infectives (From admission, onward)    None         Subjective: Laying in bed.  Stated concerned about left lower extremity weakness that it may not get better and may be feeling depressed due to that.  Tolerated Seroquel 100 mg nightly last night.  No chest pain.  No shortness of breath.  No abdominal pain.   Tolerating current diet.    Objective: Vitals:   03/06/22 0122 03/06/22 0444 03/06/22 0957 03/06/22 1215  BP: 112/73 116/81 (!) 138/92 111/70  Pulse: 79 80 73 89  Resp: (!) 22 (!) '21 19 19  '$ Temp: 98.8 F (37.1 C) 98.4 F (36.9 C) 98.4 F (36.9 C) (!) 97.4 F (36.3 C)  TempSrc: Oral Oral    SpO2: 98% 98% 98% 97%  Weight:      Height:        Intake/Output Summary (Last 24 hours) at 03/06/2022 1227 Last data filed at 03/06/2022 1020 Gross per 24 hour  Intake 680 ml  Output 125 ml  Net 555 ml    Filed Weights   03/01/22 1124  Weight: 70.8 kg    Examination:  General exam: NAD. Respiratory system: Lungs clear to auscultation bilaterally.  No wheezes, no crackles, no rhonchi.  Fair air movement.  Speaking in full sentences.  Cardiovascular system: Regular rate rhythm no murmurs rubs or gallops.  No JVD.  No lower extremity edema.  Gastrointestinal system: Abdomen is soft, nontender, nondistended, positive bowel sounds.  No rebound.  No guarding.  Central nervous system: Alert and oriented.  Left facial weakness seem to have resolved. Patient with complaints of left lower extremity and left upper extremity weakness however when distracted patient seems to be moving left extremities without any issues.   Extremities: Symmetric 5 x 5 power. Skin: No rashes, lesions or ulcers Psychiatry: Judgement and insight appear normal. Mood & affect appropriate.     Data Reviewed: I have personally reviewed following labs and imaging studies  CBC: Recent Labs  Lab 03/01/22 1142 03/02/22 0456 03/04/22 0527  WBC 9.4 8.9 7.6  NEUTROABS 6.6  --   --   HGB 14.2 13.9 12.5  HCT 43.9 43.4 39.2  MCV 93.4 95.8 96.8  PLT 338 307 276     Basic Metabolic Panel: Recent Labs  Lab 03/01/22 1142 03/02/22 0456 03/03/22 1002 03/04/22 0527 03/06/22 0531  NA 141 137 137 138 137  K 3.7 3.4* 3.2* 3.9 3.6  CL 108 108 107 107 106  CO2 22 20* 21* 21* 22  GLUCOSE 138* 114* 160* 134* 140*   BUN '7 11 8 13 15  '$ CREATININE 0.67 0.94 0.86 0.78 0.76  CALCIUM 9.2 9.2 8.8* 8.8* 9.1  MG  --   --  1.9  --   --      GFR: Estimated Creatinine Clearance: 79.3 mL/min (by C-G formula based on SCr of 0.76 mg/dL).  Liver Function Tests: Recent Labs  Lab 03/01/22 1142 03/02/22 0456  AST 19 17  ALT 15 13  ALKPHOS 87 76  BILITOT 0.6 0.2*  PROT 8.2* 7.3  ALBUMIN 3.9 3.3*     CBG: Recent Labs  Lab 03/01/22 1123  GLUCAP 145*      No results found for this or any previous visit (from the past 240 hour(s)).       Radiology Studies: ECHOCARDIOGRAM COMPLETE  Result Date: 03/04/2022    ECHOCARDIOGRAM REPORT   Patient Name:   MAZI SCHUFF Date of Exam: 03/04/2022 Medical Rec #:  [id number]     Height:       65.0 in Accession #:    [id number]    Weight:       156.0 lb Date of Birth:  [birth year]    BSA:  1.780 m Patient Age:    82 years      BP:           135/82 mmHg Patient Gender: F             HR:           71 bpm. Exam Location:  Inpatient Procedure: 2D Echo, Cardiac Doppler and Color Doppler Indications:    Stroke  History:        Patient has prior history of Echocardiogram examinations, most                 recent 02/10/2019. Stroke and TIA, Signs/Symptoms:Chest Pain;                 Risk Factors:Current Smoker and Diabetes. Polysubstance abuse.  Sonographer:    Wenda Low Referring Phys: [place]  1. Left ventricular ejection fraction, by estimation, is 60 to 65%. The left ventricle has normal function. The left ventricle has no regional wall motion abnormalities. There is mild concentric left ventricular hypertrophy. Left ventricular diastolic parameters are consistent with Grade I diastolic dysfunction (impaired relaxation).  2. Right ventricular systolic function is normal. The right ventricular size is normal. There is normal pulmonary artery systolic pressure.  3. The mitral valve is normal in structure. Trivial mitral valve  regurgitation. No evidence of mitral stenosis.  4. The aortic valve is normal in structure. Aortic valve regurgitation is not visualized. No aortic stenosis is present.  5. The inferior vena cava is normal in size with greater than 50% respiratory variability, suggesting right atrial pressure of 3 mmHg. FINDINGS  Left Ventricle: Left ventricular ejection fraction, by estimation, is 60 to 65%. The left ventricle has normal function. The left ventricle has no regional wall motion abnormalities. The left ventricular internal cavity size was normal in size. There is  mild concentric left ventricular hypertrophy. Left ventricular diastolic parameters are consistent with Grade I diastolic dysfunction (impaired relaxation). Right Ventricle: The right ventricular size is normal. No increase in right ventricular wall thickness. Right ventricular systolic function is normal. There is normal pulmonary artery systolic pressure. The tricuspid regurgitant velocity is 2.28 m/s, and  with an assumed right atrial pressure of 3 mmHg, the estimated right ventricular systolic pressure is 18.5 mmHg. Left Atrium: Left atrial size was normal in size. Right Atrium: Right atrial size was normal in size. Pericardium: There is no evidence of pericardial effusion. Mitral Valve: The mitral valve is normal in structure. Trivial mitral valve regurgitation. No evidence of mitral valve stenosis. MV peak gradient, 3.2 mmHg. The mean mitral valve gradient is 1.0 mmHg. Tricuspid Valve: The tricuspid valve is normal in structure. Tricuspid valve regurgitation is trivial. No evidence of tricuspid stenosis. Aortic Valve: The aortic valve is normal in structure. Aortic valve regurgitation is not visualized. No aortic stenosis is present. Aortic valve mean gradient measures 5.0 mmHg. Aortic valve peak gradient measures 9.2 mmHg. Aortic valve area, by VTI measures 2.73 cm. Pulmonic Valve: The pulmonic valve was normal in structure. Pulmonic valve  regurgitation is not visualized. No evidence of pulmonic stenosis. Aorta: The aortic root is normal in size and structure. Venous: The inferior vena cava is normal in size with greater than 50% respiratory variability, suggesting right atrial pressure of 3 mmHg. IAS/Shunts: No atrial level shunt detected by color flow Doppler.  LEFT VENTRICLE PLAX 2D LVIDd:         4.00 cm   Diastology LVIDs:  2.50 cm   LV e' medial:    5.11 cm/s LV PW:         1.30 cm   LV E/e' medial:  13.2 LV IVS:        1.20 cm   LV e' lateral:   6.53 cm/s LVOT diam:     2.10 cm   LV E/e' lateral: 10.4 LV SV:         87 LV SV Index:   49 LVOT Area:     3.46 cm  RIGHT VENTRICLE RV Basal diam:  3.55 cm RV Mid diam:    3.10 cm RV S prime:     17.70 cm/s TAPSE (M-mode): 1.8 cm LEFT ATRIUM           Index        RIGHT ATRIUM           Index LA diam:      3.60 cm 2.02 cm/m   RA Area:     18.50 cm LA Vol (A2C): 70.0 ml 39.33 ml/m  RA Volume:   49.00 ml  27.53 ml/m LA Vol (A4C): 38.3 ml 21.52 ml/m  AORTIC VALVE                     PULMONIC VALVE AV Area (Vmax):    2.46 cm      PV Vmax:       0.91 m/s AV Area (Vmean):   2.38 cm      PV Peak grad:  3.3 mmHg AV Area (VTI):     2.73 cm AV Vmax:           152.00 cm/s AV Vmean:          106.000 cm/s AV VTI:            0.318 m AV Peak Grad:      9.2 mmHg AV Mean Grad:      5.0 mmHg LVOT Vmax:         108.00 cm/s LVOT Vmean:        72.900 cm/s LVOT VTI:          0.251 m LVOT/AV VTI ratio: 0.79  AORTA Ao Root diam: 2.80 cm MITRAL VALVE               TRICUSPID VALVE MV Area (PHT): 2.69 cm    TR Peak grad:   20.8 mmHg MV Area VTI:   3.24 cm    TR Vmax:        228.00 cm/s MV Peak grad:  3.2 mmHg MV Mean grad:  1.0 mmHg    SHUNTS MV Vmax:       0.90 m/s    Systemic VTI:  0.25 m MV Vmean:      45.8 cm/s   Systemic Diam: 2.10 cm MV Decel Time: 282 msec MV E velocity: 67.70 cm/s MV A velocity: 84.40 cm/s MV E/A ratio:  0.80 Kardie Tobb DO Electronically signed by Berniece Salines DO Signature Date/Time:  03/04/2022/3:21:45 PM    Final         Scheduled Meds:  amLODipine  2.5 mg Oral Daily   aspirin EC  81 mg Oral Daily   Or   aspirin  300 mg Rectal Daily   atorvastatin  40 mg Oral Daily   clopidogrel  75 mg Oral Daily   enoxaparin (LOVENOX) injection  40 mg Subcutaneous Q24H   ferrous sulfate  325 mg Oral Q breakfast   gabapentin  300 mg Oral Daily   pantoprazole  40 mg Oral Daily   QUEtiapine  100 mg Oral QHS   Continuous Infusions:     LOS: 4 days    Time spent: 35 minutes    Irine Seal, MD [facility]   To contact the attending provider between 7A-7P or the covering provider during after hours 7P-7A, please log into the web site [email or web address] and access using universal [place] password for that web site. If you do not have the password, please call the hospital operator.  03/06/2022, 12:27 PM

## 2022-03-07 DIAGNOSIS — F449 Dissociative and conversion disorder, unspecified: Secondary | ICD-10-CM | POA: Diagnosis not present

## 2022-03-07 DIAGNOSIS — F4321 Adjustment disorder with depressed mood: Secondary | ICD-10-CM | POA: Diagnosis not present

## 2022-03-07 DIAGNOSIS — F1910 Other psychoactive substance abuse, uncomplicated: Secondary | ICD-10-CM | POA: Diagnosis not present

## 2022-03-07 DIAGNOSIS — I639 Cerebral infarction, unspecified: Secondary | ICD-10-CM | POA: Diagnosis not present

## 2022-03-07 NOTE — TOC Progression Note (Signed)
Transition of Care [facility]) - Progression Note    Patient Details  Name: Joanne Gonzalez MRN: [id number] Date of Birth: 02-Jan-1966  Transition of Care [facility]) CM/SW Contact  Chanae Gemma, Juliann Pulse, RN Phone Number: 03/07/2022, 2:21 PM  Clinical Narrative: Awaiting level 2 pasrr, & bed offers.      Expected Discharge Plan: [place] Barriers to Discharge: Continued Medical Work up  Expected Discharge Plan and Services Expected Discharge Plan: [place]   Discharge Planning Services: CM Consult   Living arrangements for the past 2 months: Single Family Home                                       Social Determinants of Health (SDOH) Interventions    Readmission Risk Interventions    03/05/2022   12:11 PM  Readmission Risk Prevention Plan  Transportation Screening Complete  PCP or Specialist Appt within 3-5 Days Complete  HRI or [place] Complete  Social Work Consult for [place] Planning/Counseling Complete  Palliative Care Screening Not Applicable  Medication Review [employer]) Complete

## 2022-03-07 NOTE — Plan of Care (Signed)
  Problem: Education: Goal: Knowledge of disease or condition will improve Outcome: Not Progressing   

## 2022-03-07 NOTE — Progress Notes (Signed)
PROGRESS NOTE    Joanne Gonzalez  RKY:[id number] DOB: February 10, 1966 DOA: 03/01/2022 PCP: Pcp, No    Chief Complaint  Patient presents with   Cerebrovascular Accident    Brief Narrative:  56 year old female with a history of conversion disorder.  She presented multiple times 11/16/2019 with complaint of left-sided weakness thought to be functional.  She again presents today with complaints of left-sided weakness.  She has left-sided aural facial droop and lower extremity, noted since day prior to visit. Here for stroke w/u     Assessment & Plan:   Principal Problem:   TIA (transient ischemic attack) Active Problems:   Undifferentiated schizophrenia ([place])   Homelessness   Adjustment disorder with depressed mood   Substance abuse ([place])   History of seizures   Cocaine abuse ([place])   Chronic post-traumatic stress disorder (PTSD)   Conversion disorder   Tobacco use   Stroke ([place])   Acute CVA (cerebrovascular accident) ([place])  1 left temporal frontal region CVA/ Left sided weakness -Patient presented with left facial droop left-sided weakness. -CT head done negative for any acute abnormalities. -CT angiogram head and neck done with no emergent LVO or approximately hemodynamic significant stenosis.  Right paraclinoid mass which measures approximately 2.2 x 1.9 cm most likely meningioma.  Normal CT perfusion noted no evidence of penumbra or core infarct. -CT perfusion done normal, no evidence of penumbra or core infarct. -MRI brain with punctuate acute ischemic nonhemorrhagic infarct involving the cortical gray matter of the left temporoparietal region.  Underlying moderately advanced chronic microvascular ischemic disease.  1.9 x 1.0 x 1.9 cm enhancing right paraclinoid mass most characteristic of a meningioma no associated edema or mass effect. -Lower extremity Dopplers negative for DVT. -MRA brain with no acute abnormalities. -LDL noted at 79. -2D echo with EF 60 to 65%, NWMA, grade 1  diastolic dysfunction, no embolic source noted, no PFO noted. -Patient seen in consultation by neurology who feel incidental acute ischemic stroke and patient noted with functional left-sided weakness which does not correspond to patient's symptoms and recommending atorvastatin 40 mg daily, aspirin 81 mg daily and Plavix 75 mg daily for secondary stroke prophylaxis. -Patient noted to currently be out of the window for permissive hypertension. -Continue Norvasc 2.5 mg daily and uptitrate as needed for goal systolic blood pressure < 130/80 -PT/OT/SLP. -TOC consulted for SNF placement.  2.  Substance abuse/cocaine abuse -Cessation stressed to patient.  3.  History of schizophrenia/adjustment disorder with depressed mood/PTSD -Seroquel, Neurontin, mirtazapine and trazodone initially held on presentation due to concern that this may affect patient's neuro examinations. -Continue Seroquel 100 mg daily.   4.  Conversion disorder -Follow.  5.  Tobacco use Tobacco cessation. -Nicotine patch.  6.  Hypokalemia -Repleted.  Potassium at 3.6.   DVT prophylaxis: Lovenox Code Status: Full Family Communication: Updated patient.  No family at bedside. Disposition: Medically stable.  Awaiting SNF  Status is: Inpatient Remains inpatient appropriate because: Unsafe disposition.   Consultants:  Neuro hospitalist: Dr. Leonel Ramsay 03/01/2022  Procedures:  2D echo pending Ct angiogram head and neck 03/01/2022 CT cerebral perfusion 03/01/2022 MRI brain 03/01/2022 MRA head 03/02/2022 Lower extremity Dopplers 03/02/2022 CT head without contrast 03/01/2022  Antimicrobials:  Anti-infectives (From admission, onward)    None         Subjective: Laying in bed.  Complains of left facial pain which she been complaining of since admission.  No chest pain.  No shortness of breath.  Still with complaints of left lower extremity weakness  however seems to be moving left lower extremity better today.     Objective: Vitals:   03/06/22 1215 03/06/22 1703 03/06/22 2046 03/07/22 0415  BP: 111/70 117/77 112/73 127/70  Pulse: 89 84 83 88  Resp: 19 (!) '22 18 18  '$ Temp: (!) 97.4 F (36.3 C) (!) 97.5 F (36.4 C) 99 F (37.2 C) 98.1 F (36.7 C)  TempSrc:   Oral Oral  SpO2: 97% 96% 97% 96%  Weight:      Height:        Intake/Output Summary (Last 24 hours) at 03/07/2022 1159 Last data filed at 03/07/2022 0658 Gross per 24 hour  Intake 960 ml  Output 1250 ml  Net -290 ml    Filed Weights   03/01/22 1124  Weight: 70.8 kg    Examination:  General exam: NAD. Respiratory system: CTA B anterior lung fields.  No wheezes, no crackles, no rhonchi.  Fair air movement.  Speaking in full sentences.  Cardiovascular system: RRR no murmurs rubs or gallops.  No JVD.  No lower extremity edema.  Gastrointestinal system: Abdomen is soft, nontender, nondistended, positive bowel sounds.  No rebound.  No guarding.  Central nervous system: Alert and oriented.  Left facial weakness seem to have resolved.  Complains of left facial pain. Patient with complaints of left lower extremity and left upper extremity weakness however when distracted patient seems to be moving left extremities without any issues.   Extremities: Symmetric 5 x 5 power. Skin: No rashes, lesions or ulcers Psychiatry: Judgement and insight appear normal. Mood & affect appropriate.     Data Reviewed: I have personally reviewed following labs and imaging studies  CBC: Recent Labs  Lab 03/01/22 1142 03/02/22 0456 03/04/22 0527  WBC 9.4 8.9 7.6  NEUTROABS 6.6  --   --   HGB 14.2 13.9 12.5  HCT 43.9 43.4 39.2  MCV 93.4 95.8 96.8  PLT 338 307 276     Basic Metabolic Panel: Recent Labs  Lab 03/01/22 1142 03/02/22 0456 03/03/22 1002 03/04/22 0527 03/06/22 0531  NA 141 137 137 138 137  K 3.7 3.4* 3.2* 3.9 3.6  CL 108 108 107 107 106  CO2 22 20* 21* 21* 22  GLUCOSE 138* 114* 160* 134* 140*  BUN '7 11 8 13 15   '$ CREATININE 0.67 0.94 0.86 0.78 0.76  CALCIUM 9.2 9.2 8.8* 8.8* 9.1  MG  --   --  1.9  --   --      GFR: Estimated Creatinine Clearance: 79.3 mL/min (by C-G formula based on SCr of 0.76 mg/dL).  Liver Function Tests: Recent Labs  Lab 03/01/22 1142 03/02/22 0456  AST 19 17  ALT 15 13  ALKPHOS 87 76  BILITOT 0.6 0.2*  PROT 8.2* 7.3  ALBUMIN 3.9 3.3*     CBG: Recent Labs  Lab 03/01/22 1123  GLUCAP 145*      No results found for this or any previous visit (from the past 240 hour(s)).       Radiology Studies: No results found.      Scheduled Meds:  amLODipine  2.5 mg Oral Daily   aspirin EC  81 mg Oral Daily   Or   aspirin  300 mg Rectal Daily   atorvastatin  40 mg Oral Daily   clopidogrel  75 mg Oral Daily   enoxaparin (LOVENOX) injection  40 mg Subcutaneous Q24H   ferrous sulfate  325 mg Oral Q breakfast   gabapentin  300 mg Oral  Daily   pantoprazole  40 mg Oral Daily   QUEtiapine  100 mg Oral QHS   Continuous Infusions:     LOS: 5 days    Time spent: 35 minutes    Irine Seal, MD [facility]   To contact the attending provider between 7A-7P or the covering provider during after hours 7P-7A, please log into the web site [email or web address] and access using universal [place] password for that web site. If you do not have the password, please call the hospital operator.  03/07/2022, 11:59 AM

## 2022-03-08 DIAGNOSIS — F4321 Adjustment disorder with depressed mood: Secondary | ICD-10-CM | POA: Diagnosis not present

## 2022-03-08 DIAGNOSIS — F449 Dissociative and conversion disorder, unspecified: Secondary | ICD-10-CM | POA: Diagnosis not present

## 2022-03-08 DIAGNOSIS — I639 Cerebral infarction, unspecified: Secondary | ICD-10-CM | POA: Diagnosis not present

## 2022-03-08 DIAGNOSIS — F1910 Other psychoactive substance abuse, uncomplicated: Secondary | ICD-10-CM | POA: Diagnosis not present

## 2022-03-08 LAB — BASIC METABOLIC PANEL
Anion gap: 10 (ref 5–15)
BUN: 16 mg/dL (ref 6–20)
CO2: 19 mmol/L — ABNORMAL LOW (ref 22–32)
Calcium: 9.2 mg/dL (ref 8.9–10.3)
Chloride: 107 mmol/L (ref 98–111)
Creatinine, Ser: 0.71 mg/dL (ref 0.44–1.00)
GFR, Estimated: >60 mL/min (ref >60)
Glucose, Bld: 184 mg/dL — ABNORMAL HIGH (ref 70–99)
Potassium: 3.9 mmol/L (ref 3.5–5.1)
Sodium: 136 mmol/L (ref 135–145)

## 2022-03-08 LAB — CBC
HCT: 42.3 % (ref 36.0–46.0)
Hemoglobin: 13 g/dL (ref 12.0–15.0)
MCH: 30.8 pg (ref 26.0–34.0)
MCHC: 30.7 g/dL (ref 30.0–36.0)
MCV: 100.2 fL — ABNORMAL HIGH (ref 80.0–100.0)
Platelets: 286 10*3/uL (ref 150–400)
RBC: 4.22 MIL/uL (ref 3.87–5.11)
RDW: 14.9 % (ref 11.5–15.5)
WBC: 6.6 10*3/uL (ref 4.0–10.5)
nRBC: 0 % (ref 0.0–0.2)

## 2022-03-08 MED ORDER — SODIUM BICARBONATE 650 MG PO TABS
650.0000 mg | ORAL_TABLET | Freq: Two times a day (BID) | ORAL | Status: AC
  Administered 2022-03-08 – 2022-03-10 (×6): 650 mg via ORAL
  Filled 2022-03-08 (×6): qty 1

## 2022-03-08 NOTE — Progress Notes (Signed)
PROGRESS NOTE    Joanne Gonzalez  FIE:[id number] DOB: 15-Mar-1966 DOA: 03/01/2022 PCP: Pcp, No    Chief Complaint  Patient presents with   Cerebrovascular Accident    Brief Narrative:  56 year old female with a history of conversion disorder.  She presented multiple times 11/16/2019 with complaint of left-sided weakness thought to be functional.  She again presents today with complaints of left-sided weakness.  She has left-sided aural facial droop and lower extremity, noted since day prior to visit. Here for stroke w/u     Assessment & Plan:   Principal Problem:   TIA (transient ischemic attack) Active Problems:   Undifferentiated schizophrenia ([place])   Homelessness   Adjustment disorder with depressed mood   Substance abuse ([place])   History of seizures   Cocaine abuse ([place])   Chronic post-traumatic stress disorder (PTSD)   Conversion disorder   Tobacco use   Stroke ([place])   Acute CVA (cerebrovascular accident) ([place])  1 left temporal frontal region CVA/ Left sided weakness -Patient presented with left facial droop left-sided weakness. -CT head done negative for any acute abnormalities. -CT angiogram head and neck done with no emergent LVO or approximately hemodynamic significant stenosis.  Right paraclinoid mass which measures approximately 2.2 x 1.9 cm most likely meningioma.  Normal CT perfusion noted no evidence of penumbra or core infarct. -CT perfusion done normal, no evidence of penumbra or core infarct. -MRI brain with punctuate acute ischemic nonhemorrhagic infarct involving the cortical gray matter of the left temporoparietal region.  Underlying moderately advanced chronic microvascular ischemic disease.  1.9 x 1.0 x 1.9 cm enhancing right paraclinoid mass most characteristic of a meningioma no associated edema or mass effect. -Lower extremity Dopplers negative for DVT. -MRA brain with no acute abnormalities. -LDL noted at 79. -2D echo with EF 60 to 65%, NWMA, grade 1  diastolic dysfunction, no embolic source noted, no PFO noted. -Patient seen in consultation by neurology who feel incidental acute ischemic stroke and patient noted with functional left-sided weakness which does not correspond to patient's symptoms and recommending atorvastatin 40 mg daily, aspirin 81 mg daily and Plavix 75 mg daily for secondary stroke prophylaxis. -Patient noted to be out of the window for permissive hypertension. -Continue Norvasc 2.5 mg daily and uptitrate as needed for goal systolic blood pressure < 130/80 -PT/OT/SLP. -TOC consulted for SNF placement.  2.  Substance abuse/cocaine abuse -Cessation stressed to patient.  3.  History of schizophrenia/adjustment disorder with depressed mood/PTSD -Seroquel, Neurontin, mirtazapine and trazodone initially held on presentation due to concern that this may affect patient's neuro examinations. -Continue Seroquel 100 mg daily.   4.  Conversion disorder -Follow.  5.  Tobacco use Tobacco cessation. -Nicotine patch.  6.  Hypokalemia -Repleted.  Potassium at 3.9.   DVT prophylaxis: Lovenox Code Status: Full Family Communication: Updated patient.  No family at bedside. Disposition: Medically stable.  Awaiting SNF  Status is: Inpatient Remains inpatient appropriate because: Unsafe disposition.   Consultants:  Neuro hospitalist: Dr. Leonel Ramsay 03/01/2022  Procedures:  2D echo 03/04/2022 Ct angiogram head and neck 03/01/2022 CT cerebral perfusion 03/01/2022 MRI brain 03/01/2022 MRA head 03/02/2022 Lower extremity Dopplers 03/02/2022 CT head without contrast 03/01/2022  Antimicrobials:  Anti-infectives (From admission, onward)    None         Subjective: Laying in bed.  Improvement with left facial pain.  No chest pain.  No shortness of breath.  Stated just took her medications.  Still with left-sided weakness.    Objective: Vitals:  03/07/22 1344 03/07/22 2000 03/08/22 0637 03/08/22 1240  BP: 132/83 130/73  127/77 112/68  Pulse: 82 80 89 90  Resp:  '15 19 17  '$ Temp: 98.4 F (36.9 C) 98 F (36.7 C) 98.2 F (36.8 C) (!) 97.4 F (36.3 C)  TempSrc: Oral Oral Oral   SpO2: 97% 98% 97% 98%  Weight:      Height:        Intake/Output Summary (Last 24 hours) at 03/08/2022 1320 Last data filed at 03/08/2022 0600 Gross per 24 hour  Intake 490 ml  Output 200 ml  Net 290 ml    Filed Weights   03/01/22 1124  Weight: 70.8 kg    Examination:  General exam: NAD. Respiratory system: Lungs clear to auscultation bilaterally.  No wheezes, no crackles, no rhonchi.  Fair air movement.  Speaking in full sentences.  Cardiovascular system: Regular rate rhythm no murmurs rubs or gallops.  No JVD.  No lower extremity edema.  Gastrointestinal system: Abdomen is soft, nontender, nondistended, positive bowel sounds.  No rebound.  No guarding. Central nervous system: Alert and oriented.  Left facial weakness resolved.  Patient with complaints of left lower extremity and left upper extremity weakness however when distracted patient seems to be moving left extremities without any issues.   Extremities: Symmetric 5 x 5 power. Skin: No rashes, lesions or ulcers Psychiatry: Judgement and insight appear normal. Mood & affect appropriate.     Data Reviewed: I have personally reviewed following labs and imaging studies  CBC: Recent Labs  Lab 03/02/22 0456 03/04/22 0527 03/08/22 0519  WBC 8.9 7.6 6.6  HGB 13.9 12.5 13.0  HCT 43.4 39.2 42.3  MCV 95.8 96.8 100.2*  PLT 307 276 286     Basic Metabolic Panel: Recent Labs  Lab 03/02/22 0456 03/03/22 1002 03/04/22 0527 03/06/22 0531 03/08/22 0519  NA 137 137 138 137 136  K 3.4* 3.2* 3.9 3.6 3.9  CL 108 107 107 106 107  CO2 20* 21* 21* 22 19*  GLUCOSE 114* 160* 134* 140* 184*  BUN '11 8 13 15 16  '$ CREATININE 0.94 0.86 0.78 0.76 0.71  CALCIUM 9.2 8.8* 8.8* 9.1 9.2  MG  --  1.9  --   --   --      GFR: Estimated Creatinine Clearance: 79.3 mL/min  (by C-G formula based on SCr of 0.71 mg/dL).  Liver Function Tests: Recent Labs  Lab 03/02/22 0456  AST 17  ALT 13  ALKPHOS 76  BILITOT 0.2*  PROT 7.3  ALBUMIN 3.3*     CBG: No results for input(s): "GLUCAP" in the last 168 hours.    No results found for this or any previous visit (from the past 240 hour(s)).       Radiology Studies: No results found.      Scheduled Meds:  amLODipine  2.5 mg Oral Daily   aspirin EC  81 mg Oral Daily   Or   aspirin  300 mg Rectal Daily   atorvastatin  40 mg Oral Daily   clopidogrel  75 mg Oral Daily   enoxaparin (LOVENOX) injection  40 mg Subcutaneous Q24H   ferrous sulfate  325 mg Oral Q breakfast   gabapentin  300 mg Oral Daily   pantoprazole  40 mg Oral Daily   QUEtiapine  100 mg Oral QHS   sodium bicarbonate  650 mg Oral BID   Continuous Infusions:     LOS: 6 days    Time spent: 35 minutes  Irine Seal, MD [facility]   To contact the attending provider between 7A-7P or the covering provider during after hours 7P-7A, please log into the web site [email or web address] and access using universal [place] password for that web site. If you do not have the password, please call the hospital operator.  03/08/2022, 1:20 PM

## 2022-03-08 NOTE — Progress Notes (Signed)
Pt stated she doesn't want morning meds until after lunch. Doctor notified

## 2022-03-08 NOTE — Progress Notes (Signed)
2000 patient alert x4 able to make all needs known patient unable to love left leg off bed and or left arm when entering room patient holding cell phone in left hand and drops phone to bed. Patient denies using left side. 2330 patient up to bedside commode no assistance needed

## 2022-03-09 ENCOUNTER — Inpatient Hospital Stay (HOSPITAL_COMMUNITY): Payer: Medicaid Other

## 2022-03-09 DIAGNOSIS — F449 Dissociative and conversion disorder, unspecified: Secondary | ICD-10-CM | POA: Diagnosis not present

## 2022-03-09 DIAGNOSIS — F1910 Other psychoactive substance abuse, uncomplicated: Secondary | ICD-10-CM | POA: Diagnosis not present

## 2022-03-09 DIAGNOSIS — I639 Cerebral infarction, unspecified: Secondary | ICD-10-CM | POA: Diagnosis not present

## 2022-03-09 DIAGNOSIS — F4321 Adjustment disorder with depressed mood: Secondary | ICD-10-CM | POA: Diagnosis not present

## 2022-03-09 LAB — CBC WITH DIFFERENTIAL/PLATELET
Abs Immature Granulocytes: 0.04 10*3/uL (ref 0.00–0.07)
Basophils Absolute: 0.1 10*3/uL (ref 0.0–0.1)
Basophils Relative: 1 %
Eosinophils Absolute: 0.2 10*3/uL (ref 0.0–0.5)
Eosinophils Relative: 4 %
HCT: 38.6 % (ref 36.0–46.0)
Hemoglobin: 12.3 g/dL (ref 12.0–15.0)
Immature Granulocytes: 1 %
Lymphocytes Relative: 23 %
Lymphs Abs: 1.4 10*3/uL (ref 0.7–4.0)
MCH: 30.3 pg (ref 26.0–34.0)
MCHC: 31.9 g/dL (ref 30.0–36.0)
MCV: 95.1 fL (ref 80.0–100.0)
Monocytes Absolute: 0.7 10*3/uL (ref 0.1–1.0)
Monocytes Relative: 12 %
Neutro Abs: 3.4 10*3/uL (ref 1.7–7.7)
Neutrophils Relative %: 59 %
Platelets: 320 10*3/uL (ref 150–400)
RBC: 4.06 MIL/uL (ref 3.87–5.11)
RDW: 14.6 % (ref 11.5–15.5)
WBC: 5.8 10*3/uL (ref 4.0–10.5)
nRBC: 0 % (ref 0.0–0.2)

## 2022-03-09 LAB — BASIC METABOLIC PANEL
Anion gap: 6 (ref 5–15)
BUN: 11 mg/dL (ref 6–20)
CO2: 23 mmol/L (ref 22–32)
Calcium: 9.1 mg/dL (ref 8.9–10.3)
Chloride: 106 mmol/L (ref 98–111)
Creatinine, Ser: 0.62 mg/dL (ref 0.44–1.00)
GFR, Estimated: >60 mL/min (ref >60)
Glucose, Bld: 184 mg/dL — ABNORMAL HIGH (ref 70–99)
Potassium: 3.9 mmol/L (ref 3.5–5.1)
Sodium: 135 mmol/L (ref 135–145)

## 2022-03-09 NOTE — Plan of Care (Signed)
  Problem: Education: Goal: Knowledge of disease or condition will improve Outcome: Progressing Goal: Knowledge of secondary prevention will improve (MUST DOCUMENT ALL) Outcome: Progressing Goal: Knowledge of patient specific risk factors will improve [place] N/A or DELETE if not current risk factor) Outcome: Progressing   Problem: Ischemic Stroke/TIA Tissue Perfusion: Goal: Complications of ischemic stroke/TIA will be minimized Outcome: Progressing   Problem: Coping: Goal: Will verbalize positive feelings about self Outcome: Progressing Goal: Will identify appropriate support needs Outcome: Progressing   Problem: Health Behavior/Discharge Planning: Goal: Ability to manage health-related needs will improve Outcome: Progressing Goal: Goals will be collaboratively established with patient/family Outcome: Progressing   Problem: Self-Care: Goal: Ability to participate in self-care as condition permits will improve Outcome: Progressing Goal: Verbalization of feelings and concerns over difficulty with self-care will improve Outcome: Progressing Goal: Ability to communicate needs accurately will improve Outcome: Progressing   Problem: Health Behavior/Discharge Planning: Goal: Ability to manage health-related needs will improve Outcome: Progressing Goal: Goals will be collaboratively established with patient/family Outcome: Progressing   Problem: Nutrition: Goal: Risk of aspiration will decrease Outcome: Progressing Goal: Dietary intake will improve Outcome: Progressing   Problem: Education: Goal: Knowledge of General Education information will improve Description: Including pain rating scale, medication(s)/side effects and non-pharmacologic comfort measures Outcome: Progressing   Problem: Health Behavior/Discharge Planning: Goal: Ability to manage health-related needs will improve Outcome: Progressing   Problem: Clinical Measurements: Goal: Ability to maintain clinical  measurements within normal limits will improve Outcome: Progressing Goal: Will remain free from infection Outcome: Progressing Goal: Diagnostic test results will improve Outcome: Progressing Goal: Respiratory complications will improve Outcome: Progressing Goal: Cardiovascular complication will be avoided Outcome: Progressing   Problem: Activity: Goal: Risk for activity intolerance will decrease Outcome: Progressing   Problem: Nutrition: Goal: Adequate nutrition will be maintained Outcome: Progressing   Problem: Coping: Goal: Level of anxiety will decrease Outcome: Progressing   Problem: Elimination: Goal: Will not experience complications related to bowel motility Outcome: Progressing Goal: Will not experience complications related to urinary retention Outcome: Progressing   Problem: Pain Managment: Goal: General experience of comfort will improve Outcome: Progressing   Problem: Safety: Goal: Ability to remain free from injury will improve Outcome: Progressing   Problem: Skin Integrity: Goal: Risk for impaired skin integrity will decrease Outcome: Progressing

## 2022-03-09 NOTE — Plan of Care (Signed)
  Problem: Education: Goal: Knowledge of disease or condition will improve Outcome: Progressing Goal: Knowledge of secondary prevention will improve (MUST DOCUMENT ALL) Outcome: Progressing Goal: Knowledge of patient specific risk factors will improve [place] N/A or DELETE if not current risk factor) Outcome: Progressing

## 2022-03-09 NOTE — Progress Notes (Addendum)
Occupational Therapy Treatment Patient Details Name: Joanne Gonzalez MRN: [id number] DOB: 06-Feb-1966 Today's Date: 03/09/2022   History of present illness [name] is a 56 year old female who presented with left sided weakness. MRI revealed "Punctate acute ischemic nonhemorrhagic infarct involving the cortical gray matter of the left temporoparietal region.1.9 x 1.0 x 1.9 cm enhancing right paraclinoid mass, most characteristic of a meningioma." PMH: conversion disorder, schizophrenia, seizures, CVA, depression, PTSD, substance abuse.   OT comments  Patient was noted to have made progress compared to evaluation. Patient was able to actively abduct arm to don deodorant sitting on edge of 3 in 1 commode. Patient continues to have inconsistent use of LUE during session with noted active movements more when therapist is engaged in other tasks. Patient would continue to benefit from skilled OT services at this time while admitted and after d/c to address noted deficits in order to improve overall safety and independence in ADLs. Patient would continue to benefit from skilled OT services at this time while admitted and after d/c to address noted deficits in order to improve overall safety and independence in ADLs.     Recommendations for follow up therapy are one component of a multi-disciplinary discharge planning process, led by the attending physician.  Recommendations may be updated based on patient status, additional functional criteria and insurance authorization.    Follow Up Recommendations  Skilled nursing-short term rehab (<3 hours/day)     Assistance Recommended at Discharge Frequent or constant Supervision/Assistance  Patient can return home with the following  A little help with bathing/dressing/bathroom;A lot of help with walking and/or transfers;Direct supervision/assist for financial management;Help with stairs or ramp for entrance;Direct supervision/assist for medications  management;Assist for transportation;Assistance with cooking/housework   Equipment Recommendations  Other (comment) (defer to next venue)    Recommendations for Other Services      Precautions / Restrictions Precautions Precautions: Fall Restrictions Weight Bearing Restrictions: No       Mobility Bed Mobility Overal bed mobility: Needs Assistance Bed Mobility: Supine to Sit     Supine to sit: Min guard          Transfers                         Balance Overall balance assessment: Needs assistance Sitting-balance support: Feet supported Sitting balance-Leahy Scale: Good     Standing balance support: During functional activity, Reliant on assistive device for balance, Bilateral upper extremity supported Standing balance-Leahy Scale: Fair                             ADL either performed or assessed with clinical judgement   ADL Overall ADL's : Needs assistance/impaired     Grooming: Wash/dry face;Wash/dry hands;Applying deodorant;Set up;Sitting Grooming Details (indicate cue type and reason): sitting on 3 in 1 commode in room Upper Body Bathing: Set up;Sitting Upper Body Bathing Details (indicate cue type and reason): on 3 in 1 commode Lower Body Bathing: Min guard;Set up;Sitting/lateral leans Lower Body Bathing Details (indicate cue type and reason): on 3 in 1 commode Upper Body Dressing : Set up;Sitting   Lower Body Dressing: Set up;Min guard;Sitting/lateral leans;Sit to/from stand Lower Body Dressing Details (indicate cue type and reason): to don mesh underwear with standing with noted use of BUE LUE to pull up mesh underwear and RUE to hold walker for balance Toilet Transfer: Minimal assistance;Ambulation;Rolling walker (2 wheels) Toilet Transfer Details (indicate  cue type and reason): patient requested to walk from edge of bed to opposite side of room with RW with patient able to maintain balance and attempt to move objects in room in the  way. patient was eduacted on sitting on bed to wait for room to be set up with patient declining and grabbing walker attempting to walk when therapist was in middle of setting up the area for safety. upopn standing from commode patient was noted to have red discharge in commode. patietn was asked if she has started her period. patient reproted she had not. MD present in room at the end of session and made aware as well as nurse. Toileting- Water quality scientist and Hygiene: Min guard;Sitting/lateral lean Toileting - Clothing Manipulation Details (indicate cue type and reason): on 3 in 1commode            Extremity/Trunk Assessment Upper Extremity Assessment Upper Extremity Assessment: LUE deficits/detail LUE Deficits / Details: Patient was able to use LUE to pick up LLE and put mesh underwear on. patient wa snoted to be consistently inconsistent in movement of UE during session. able to AROM hand, wrist and ABDuct shoulder to don deoderant. noted to have more movement of BUE and BLE when therapist was moving around in room compared to when therapist was looking at patient.             Cognition Arousal/Alertness: Awake/alert Behavior During Therapy: Flat affect Overall Cognitive Status: Difficult to assess            General Comments: Patient remains inconsistent in abilities. patient demonstrated poor safety awareness with attempts to walk in room withouth non skid socks in place.                   Pertinent Vitals/ Pain       Pain Assessment Pain Assessment: Faces Faces Pain Scale: Hurts a little bit Pain Location: generalized pain Pain Intervention(s): Limited activity within patient's tolerance, Monitored during session         Frequency  Min 2X/week        Progress Toward Goals  OT Goals(current goals can now be found in the care plan section)  Progress towards OT goals: Progressing toward goals     Plan Discharge plan remains appropriate        AM-PAC OT "6 Clicks" Daily Activity     Outcome Measure   Help from another person eating meals?: A Little Help from another person taking care of personal grooming?: A Little Help from another person toileting, which includes using toliet, bedpan, or urinal?: A Lot Help from another person bathing (including washing, rinsing, drying)?: A Lot Help from another person to put on and taking off regular upper body clothing?: A Lot Help from another person to put on and taking off regular lower body clothing?: A Lot 6 Click Score: 14    End of Session Equipment Utilized During Treatment: Gait belt;Rolling walker (2 wheels)  OT Visit Diagnosis: Unsteadiness on feet (R26.81);Other abnormalities of gait and mobility (R26.89);Muscle weakness (generalized) (M62.81)   Activity Tolerance Patient tolerated treatment well   Patient Left in bed;with bed alarm set;with call bell/phone within reach;with nursing/sitter in room   Nurse Communication Other (comment) (patient having blood in 3 in 1 commode MD and nurse made aware. nurse made aware that discharge was still in 3 in 1 commode near bathroom doorway to observe and measure. nurse verbalized understanding.)        Time: 9417-4081 OT Time  Calculation (min): 30 min  Charges: OT General Charges $OT Visit: 1 Visit OT Treatments $Self Care/Home Management : 23-37 mins  Rennie Plowman, MS Acute Rehabilitation Department Office# [phone]   Willa Rough 03/09/2022, 11:52 AM

## 2022-03-09 NOTE — Progress Notes (Signed)
Patient standing in restroom asks for a maxipad. This RN reaches out to hand patient a pad and patient reaches out with left arm to grasp, raising arm above shoulder then quickly grabs left arm with right hand and pulls it back down.

## 2022-03-09 NOTE — Progress Notes (Signed)
PROGRESS NOTE    Joanne Gonzalez  JJK:[id number] DOB: [birth year] DOA: 03/01/2022 PCP: Pcp, No    Chief Complaint  Patient presents with   Cerebrovascular Accident    Brief Narrative:  56 year old female with a history of conversion disorder.  She presented multiple times 11/16/2019 with complaint of left-sided weakness thought to be functional.  She again presents today with complaints of left-sided weakness.  She has left-sided aural facial droop and lower extremity, noted since day prior to visit. Here for stroke w/u     Assessment & Plan:   Principal Problem:   TIA (transient ischemic attack) Active Problems:   Undifferentiated schizophrenia ([place])   Homelessness   Adjustment disorder with depressed mood   Substance abuse ([place])   History of seizures   Cocaine abuse ([place])   Chronic post-traumatic stress disorder (PTSD)   Conversion disorder   Tobacco use   Stroke ([place])   Acute CVA (cerebrovascular accident) ([place])  1 left temporal frontal region CVA/ Left sided weakness -Patient presented with left facial droop left-sided weakness. -CT head done negative for any acute abnormalities. -CT angiogram head and neck done with no emergent LVO or approximately hemodynamic significant stenosis.  Right paraclinoid mass which measures approximately 2.2 x 1.9 cm most likely meningioma.  Normal CT perfusion noted no evidence of penumbra or core infarct. -CT perfusion done normal, no evidence of penumbra or core infarct. -MRI brain with punctuate acute ischemic nonhemorrhagic infarct involving the cortical gray matter of the left temporoparietal region.  Underlying moderately advanced chronic microvascular ischemic disease.  1.9 x 1.0 x 1.9 cm enhancing right paraclinoid mass most characteristic of a meningioma no associated edema or mass effect. -Lower extremity Dopplers negative for DVT. -MRA brain with no acute abnormalities. -LDL noted at 79. -2D echo with EF 60 to 65%, NWMA, grade 1  diastolic dysfunction, no embolic source noted, no PFO noted. -Patient seen in consultation by neurology who feel incidental acute ischemic stroke and patient noted with functional left-sided weakness which does not correspond to patient's symptoms and recommending atorvastatin 40 mg daily, aspirin 81 mg daily and Plavix 75 mg daily for secondary stroke prophylaxis. -Patient noted to be out of the window for permissive hypertension. -Continue Norvasc 2.5 mg daily and uptitrate as needed for goal systolic blood pressure < 130/80 -PT/OT/SLP. -TOC consulted for SNF placement.  2.  Substance abuse/cocaine abuse -Cessation stressed to patient.  3.  History of schizophrenia/adjustment disorder with depressed mood/PTSD -Seroquel, Neurontin, mirtazapine and trazodone initially held on presentation due to concern that this may affect patient's neuro examinations. -Continue Seroquel 100 mg daily.   4.  Conversion disorder -Follow.  5.  Tobacco use Tobacco cessation. -Nicotine patch.  6.  Hypokalemia -Repleted.  Potassium at 3.9.  7.?  Vaginal bleeding -Patient noted with some vaginal bleeding and commode per OT. -Patient states she has been having some vaginal bleeding for the past 3 days and said that she notified nursing.  Patient denies GI tract bleed. -Patient states has not had her menses in 1 to 2 years. -Check H&H. -Check a pelvic ultrasound. -If pelvic ultrasound abnormal and ongoing vaginal bleeding will need to consult with GYN for further evaluation and management.   DVT prophylaxis: Lovenox Code Status: Full Family Communication: Updated patient.  No family at bedside. Disposition: Medically stable.  Awaiting SNF  Status is: Inpatient Remains inpatient appropriate because: Unsafe disposition.   Consultants:  Neuro hospitalist: Dr. Leonel Ramsay 03/01/2022  Procedures:  2D echo 03/04/2022 Ct angiogram  head and neck 03/01/2022 CT cerebral perfusion 03/01/2022 MRI brain  03/01/2022 MRA head 03/02/2022 Lower extremity Dopplers 03/02/2022 CT head without contrast 03/01/2022  Antimicrobials:  Anti-infectives (From admission, onward)    None         Subjective: Patient laying in bed.  OT at bedside states patient with some vaginal bleeding.  Patient states she is been having vaginal bleeding since Saturday/Sunday and informed the nurse to inform the MD.  Patient denies having her menses over the past 1 to 2 years.  Patient denies any GI bleed.  Denies blood in her urine.  Objective: Vitals:   03/08/22 1240 03/08/22 2059 03/09/22 0000 03/09/22 0400  BP: 112/68 110/66 118/68 101/69  Pulse: 90 98 96 (!) 103  Resp: 17 18 (!) 21 20  Temp: (!) 97.4 F (36.3 C) 97.9 F (36.6 C) 98.8 F (37.1 C) 97.8 F (36.6 C)  TempSrc:   Oral Oral  SpO2: 98% 98% 98% 97%  Weight:      Height:        Intake/Output Summary (Last 24 hours) at 03/09/2022 1139 Last data filed at 03/09/2022 0200 Gross per 24 hour  Intake 580 ml  Output --  Net 580 ml    Filed Weights   03/01/22 1124  Weight: 70.8 kg    Examination:  General exam: NAD. Respiratory system: CTAB.  No wheezes, no crackles, no rhonchi.  Fair air movement.  Speaking in full sentences.  Cardiovascular system: RRR no murmurs rubs or gallops.  No JVD.  No lower extremity edema.  Gastrointestinal system: Abdomen is soft, nontender, nondistended, positive bowel sounds.  No rebound.  No guarding.  Central nervous system: Alert and oriented.  Left facial weakness resolved.  Patient with complaints of left lower extremity and left upper extremity weakness however when distracted patient seems to be moving left extremities without any issues.   Extremities: Symmetric 5 x 5 power. Skin: No rashes, lesions or ulcers Psychiatry: Judgement and insight appear normal. Mood & affect appropriate.     Data Reviewed: I have personally reviewed following labs and imaging studies  CBC: Recent Labs  Lab  03/04/22 0527 03/08/22 0519  WBC 7.6 6.6  HGB 12.5 13.0  HCT 39.2 42.3  MCV 96.8 100.2*  PLT 276 286     Basic Metabolic Panel: Recent Labs  Lab 03/03/22 1002 03/04/22 0527 03/06/22 0531 03/08/22 0519  NA 137 138 137 136  K 3.2* 3.9 3.6 3.9  CL 107 107 106 107  CO2 21* 21* 22 19*  GLUCOSE 160* 134* 140* 184*  BUN '8 13 15 16  '$ CREATININE 0.86 0.78 0.76 0.71  CALCIUM 8.8* 8.8* 9.1 9.2  MG 1.9  --   --   --      GFR: Estimated Creatinine Clearance: 79.3 mL/min (by C-G formula based on SCr of 0.71 mg/dL).  Liver Function Tests: No results for input(s): "AST", "ALT", "ALKPHOS", "BILITOT", "PROT", "ALBUMIN" in the last 168 hours.   CBG: No results for input(s): "GLUCAP" in the last 168 hours.    No results found for this or any previous visit (from the past 240 hour(s)).       Radiology Studies: No results found.      Scheduled Meds:  amLODipine  2.5 mg Oral Daily   aspirin EC  81 mg Oral Daily   Or   aspirin  300 mg Rectal Daily   atorvastatin  40 mg Oral Daily   clopidogrel  75 mg Oral Daily  enoxaparin (LOVENOX) injection  40 mg Subcutaneous Q24H   ferrous sulfate  325 mg Oral Q breakfast   gabapentin  300 mg Oral Daily   pantoprazole  40 mg Oral Daily   QUEtiapine  100 mg Oral QHS   sodium bicarbonate  650 mg Oral BID   Continuous Infusions:     LOS: 7 days    Time spent: 35 minutes    Irine Seal, MD [facility]   To contact the attending provider between 7A-7P or the covering provider during after hours 7P-7A, please log into the web site [email or web address] and access using universal [place] password for that web site. If you do not have the password, please call the hospital operator.  03/09/2022, 11:39 AM

## 2022-03-09 NOTE — TOC Progression Note (Signed)
Transition of Care [facility]) - Progression Note    Patient Details  Name: Joanne Gonzalez MRN: [id number] Date of Birth: 01/09/1966  [facility] CM/SW Contact  Luke Falero, Juliann Pulse, RN Phone Number: 03/09/2022, 3:47 PM  Clinical Narrative:   Pas[facility] level 2 under review;no bed offers    Expected Discharge Plan: [place] Barriers to Discharge: Continued Medical Work up  Expected Discharge Plan and Services Expected Discharge Plan: [place]   Discharge Planning Services: CM Consult   Living arrangements for the past 2 months: Single Family Home                                       Social Determinants of Health (SDOH) Interventions    Readmission Risk Interventions    03/05/2022   12:11 PM  Readmission Risk Prevention Plan  Transportation Screening Complete  PCP or Specialist Appt within 3-5 Days Complete  HRI or [place] Complete  Social Work Consult for [place] Planning/Counseling Complete  Palliative Care Screening Not Applicable  Medication Review [employer]) Complete

## 2022-03-10 DIAGNOSIS — G459 Transient cerebral ischemic attack, unspecified: Secondary | ICD-10-CM | POA: Diagnosis not present

## 2022-03-10 LAB — CBC
HCT: 37.1 % (ref 36.0–46.0)
Hemoglobin: 11.7 g/dL — ABNORMAL LOW (ref 12.0–15.0)
MCH: 30.6 pg (ref 26.0–34.0)
MCHC: 31.5 g/dL (ref 30.0–36.0)
MCV: 97.1 fL (ref 80.0–100.0)
Platelets: 311 10*3/uL (ref 150–400)
RBC: 3.82 MIL/uL — ABNORMAL LOW (ref 3.87–5.11)
RDW: 14.7 % (ref 11.5–15.5)
WBC: 6.5 10*3/uL (ref 4.0–10.5)
nRBC: 0 % (ref 0.0–0.2)

## 2022-03-10 LAB — BASIC METABOLIC PANEL
Anion gap: 10 (ref 5–15)
BUN: 14 mg/dL (ref 6–20)
CO2: 23 mmol/L (ref 22–32)
Calcium: 9.2 mg/dL (ref 8.9–10.3)
Chloride: 105 mmol/L (ref 98–111)
Creatinine, Ser: 1.02 mg/dL — ABNORMAL HIGH (ref 0.44–1.00)
GFR, Estimated: >60 mL/min (ref >60)
Glucose, Bld: 217 mg/dL — ABNORMAL HIGH (ref 70–99)
Potassium: 3.8 mmol/L (ref 3.5–5.1)
Sodium: 138 mmol/L (ref 135–145)

## 2022-03-10 MED ORDER — GABAPENTIN 300 MG PO CAPS
300.0000 mg | ORAL_CAPSULE | Freq: Every day | ORAL | Status: DC
  Administered 2022-03-11 – 2022-03-14 (×4): 300 mg via ORAL
  Filled 2022-03-10 (×4): qty 1

## 2022-03-10 NOTE — Plan of Care (Signed)
  Problem: Education: Goal: Knowledge of disease or condition will improve Outcome: Progressing   Problem: Ischemic Stroke/TIA Tissue Perfusion: Goal: Complications of ischemic stroke/TIA will be minimized Outcome: Progressing

## 2022-03-10 NOTE — TOC Progression Note (Signed)
Transition of Care [facility]) - Progression Note    Patient Details  Name: Joanne Gonzalez MRN: [id number] Date of Birth: May 12, 1965  Transition of Care [facility]) CM/SW Contact  Kellyn Mccary, Juliann Pulse, RN Phone Number: 03/10/2022, 11:21 AM  Clinical Narrative:  Pasrr level 2 rep to come onsite to see patient-Russell Snipes. Still no bed offers.     Expected Discharge Plan: [place] Barriers to Discharge: Continued Medical Work up  Expected Discharge Plan and Services Expected Discharge Plan: [place]   Discharge Planning Services: CM Consult   Living arrangements for the past 2 months: Single Family Home                                       Social Determinants of Health (SDOH) Interventions    Readmission Risk Interventions    03/05/2022   12:11 PM  Readmission Risk Prevention Plan  Transportation Screening Complete  PCP or Specialist Appt within 3-5 Days Complete  HRI or [place] Complete  Social Work Consult for [place] Planning/Counseling Complete  Palliative Care Screening Not Applicable  Medication Review [employer] photographer) Complete

## 2022-03-10 NOTE — Progress Notes (Signed)
Patient requests morning meds to be given at lunch.

## 2022-03-10 NOTE — Progress Notes (Signed)
[facility] Progress Note Patient: Joanne Gonzalez IHW:[id number] DOB: 1965-04-20 DOA: 03/01/2022  DOS: the patient was seen and examined on 03/10/2022  Brief hospital course: 56 year old female with a history of conversion disorder.  She presented multiple times 11/16/2019 with complaint of left-sided weakness thought to be functional.  She again presents today with complaints of left-sided weakness.  She has left-sided aural facial droop and lower extremity, noted since day prior to visit. Here for stroke w/u Assessment and Plan: Left-sided weakness. Acute left ischemic nonhemorrhagic infarct and left temporoparietal region. Neurology was consulted. Stroke workup was recommended. LDL 79.  2D echocardiogram shows preserved EF without any wall motion abnormality or valvular abnormality. CT head and CT perfusion negative for any large vessel occlusion. Currently on aspirin and Plavix. Also on Lipitor 40 mg daily. PT OT recommended SNF. Speech therapy was consulted as well. Patient tolerating oral diet. Monitor.  History of substance abuse. Cessation recommended. UDS on admission positive for cocaine and cannabis.  History of schizophrenia. PTSD. Insomnia. Continue current regimen. Change Neurontin to nightly.  Conversion disorder. For now monitor.  Active smoker. Nicotine patch. Recommend to quit smoking.  Vaginal bleeding. So far no bleeding seen so H&H relatively stable. Pelvic ultrasound shows evidence of endometrial thickening. Outpatient GYN follow-up recommended for endometrial biopsy.  Hypokalemia. Replaced.  Subjective: No nausea no vomiting no fever no chills.  No other acute complaints.  Physical Exam: General: in no distress;  Cardiovascular: S1 and S2 Present, no Murmur Respiratory: good respiratory effort, Bilateral Air entry present, no Crackles, no wheezes Abdomen: Bowel Sound present, Non tender  Extremities: No edema. Neurology: alert and  oriented to time, place, and person left-sided weakness in both upper and lower extremity although able to flicker her fingers of the left upper extremity.  Data Reviewed: I have Reviewed nursing notes, Vitals, and Lab results. Since last encounter, pertinent lab results CBC and BMP   . I have ordered test including CBC and BMP  .   Disposition: Status is: Inpatient Remains inpatient appropriate because: Need SNF placement.  enoxaparin (LOVENOX) injection 40 mg Start: 03/02/22 1000 SCD's Start: 03/02/22 0245   Family Communication: Patient had a case manager present at the bedside.  Patient provided permission to discuss her medical plan in front of the case manager. Level of care: Telemetry continue telemetry due to stroke presentation.  Vitals:   03/09/22 2056 03/10/22 0601 03/10/22 0707 03/10/22 1300  BP: 116/74 122/80  110/67  Pulse: 99 79  94  Resp: '20  19 17  '$ Temp: 98 F (36.7 C) 98.9 F (37.2 C)  98.1 F (36.7 C)  TempSrc: Oral Oral  Oral  SpO2: 98%   97%  Weight:      Height:         Author: Berle Mull, MD 03/10/2022 7:23 PM  Please look on [email or web address] to find out who is on call.

## 2022-03-11 NOTE — Progress Notes (Signed)
[facility] Progress Note Patient: Joanne Gonzalez EZM:[id number] DOB: 1965-04-21 DOA: 03/01/2022  DOS: the patient was seen and examined on 03/11/2022  Brief hospital course: 56 year old female with a history of conversion disorder.  She presented multiple times 11/16/2019 with complaint of left-sided weakness thought to be functional.  She again presents today with complaints of left-sided weakness.  She has left-sided aural facial droop and lower extremity, noted since day prior to visit. Here for stroke w/u Assessment and Plan: Left-sided weakness. Acute left ischemic nonhemorrhagic infarct and left temporoparietal region. Neurology was consulted. Stroke workup was recommended. LDL 79.  2D echocardiogram shows preserved EF without any wall motion abnormality or valvular abnormality. CT head and CT perfusion negative for any large vessel occlusion. Currently on aspirin and Plavix. Also on Lipitor 40 mg daily. PT OT recommended SNF. Speech therapy was consulted as well. Patient tolerating oral diet. Monitor.  History of substance abuse. Cessation recommended. UDS on admission positive for cocaine and cannabis.  History of schizophrenia. PTSD. Insomnia. Continue current regimen. Change Neurontin to nightly.  Conversion disorder. For now monitor.  Active smoker. Nicotine patch. Recommend to quit smoking.  Vaginal bleeding. Patient reports using multiple tampons yesterday but no bleeding reported. H&H relatively stable.  Will recheck tomorrow. Pelvic ultrasound shows evidence of endometrial thickening. Outpatient GYN follow-up recommended for endometrial biopsy.  Hypokalemia. Replaced.  Subjective: No acute complaints.  Nausea and vomiting.  Reports that she actually used 4 tampons yesterday with intermittent bleeding.  Physical Exam: General: Appear in mild distress; Cardiovascular: S1 and S2 Present, no Murmur, Respiratory: good respiratory effort, Bilateral Air  entry present, CTA, no Crackles, no wheezes Abdomen: Bowel Sound present, Non tender  Extremities: no Pedal edema Neurology: alert and oriented to time, place, and person   Data Reviewed: I have Reviewed nursing notes, Vitals, and Lab results. I have ordered test including CBC and BMP  .    Disposition: Status is: Inpatient Remains inpatient appropriate because: Need SNF placement.  enoxaparin (LOVENOX) injection 40 mg Start: 03/02/22 1000 SCD's Start: 03/02/22 0245   Family Communication: No one at bedside Level of care: Telemetry continue telemetry due to stroke presentation.  Vitals:   03/10/22 0707 03/10/22 1300 03/10/22 2023 03/11/22 0503  BP:  110/67 126/81 124/77  Pulse:  94 100 98  Resp: '19 17 19 20  '$ Temp:  98.1 F (36.7 C) 98 F (36.7 C) 97.9 F (36.6 C)  TempSrc:  Oral Oral Oral  SpO2:  97% 97% 96%  Weight:      Height:         Author: Berle Mull, MD 03/11/2022 7:11 PM  Please look on [email or web address] to find out who is on call.

## 2022-03-11 NOTE — Progress Notes (Signed)
Physical Therapy Treatment Patient Details Name: Joanne Gonzalez MRN: [id number] DOB: [birth year] Today's Date: 03/11/2022   History of Present Illness Joanne Gonzalez is a 56 year old female who presented with left sided weakness. MRI revealed "Punctate acute ischemic nonhemorrhagic infarct involving the cortical gray matter of the left temporoparietal region.1.9 x 1.0 x 1.9 cm enhancing right paraclinoid mass, most characteristic of a meningioma." PMH: conversion disorder, schizophrenia, seizures, CVA, depression, PTSD, substance abuse.    PT Comments    General Comments: AxO x 3 selective cooperation.  Good day today.  Pt stated she was living with BF she called "a fool" and "It's aweful". Pt agreeable to amb in bathroom then in hallway. General bed mobility comments: pt using momentum to scoot and  R UE to assist L LE.  Impulsive. General transfer comment: pt using momentum and "quick" action to rise from bed.  L UE appears unfunctional as pt uses R UE to place L UE on walker.  Pt using momentum to rise "rather quickly" and unsafely. as L LE exhibits knee buckling and hip instability.  Also asissted with a toilet transfer.  Pt self able to perform peri care seated and again uses momentum to rise to standing.  Ataxic.  Unsteady.  HIGH FALL RISK. General Gait Details: assisted with amb to bathroom.  Pt present with L foot drop ZERO DF with tendancy to "high step" to compensate.  Pt also present with both hip and knee weakness/instability in which pt compensated with excessive lean on walker.  Assisted with amb in hallway.   VERY unsteady esp with turns.  High stepping LEFT and quick/impulsive speed.  HIGH FALL RISK. Pt will need ST Rehab at SNF to address mobility and functional decline prior to safely returning home.   Recommendations for follow up therapy are one component of a multi-disciplinary discharge planning process, led by the attending physician.  Recommendations may be updated based on  patient status, additional functional criteria and insurance authorization.  Follow Up Recommendations  Skilled nursing-short term rehab (<3 hours/day)     Assistance Recommended at Discharge Frequent or constant Supervision/Assistance  Patient can return home with the following A little help with walking and/or transfers;A little help with bathing/dressing/bathroom;Assistance with cooking/housework;Assist for transportation   Equipment Recommendations  None recommended by PT    Recommendations for Other Services       Precautions / Restrictions Precautions Precautions: Fall Precaution Comments: L Hemiparesis Restrictions Weight Bearing Restrictions: No     Mobility  Bed Mobility Overal bed mobility: Needs Assistance Bed Mobility: Supine to Sit     Supine to sit: Supervision, Min guard     General bed mobility comments: pt using momentum to scoot and  R UE to assist L LE.  Impulsive.    Transfers Overall transfer level: Needs assistance Equipment used: Rolling walker (2 wheels) Transfers: Sit to/from Stand, Bed to chair/wheelchair/BSC Sit to Stand: Min assist, Mod assist   Step pivot transfers: Mod assist       General transfer comment: pt using momentum and "quick" action to rise from bed.  L UE appears unfunctional as pt uses R UE to place L UE on walker.  Pt using momentum to rise "rather quickly" and unsafely. as L LE exhibits knee buckling and hip instability.  Also asissted with a toilet transfer.  Pt self able to perform peri care seated and again uses momentum to rise to standing.  Ataxic.  Unsteady.  HIGH FALL RISK.    Ambulation/Gait  Ambulation/Gait assistance: Mod assist Gait Distance (Feet): 48 Feet Assistive device: Rolling walker (2 wheels) Gait Pattern/deviations: Step-to pattern, Decreased stance time - left, Ataxic, Staggering left Gait velocity: decreased     General Gait Details: assisted with amb to bathroom.  Pt present with L foot drop  ZERO DF with tendancy to "high step" to compensate.  Pt also present with both hip and knee weakness/instability in which pt compensated with excessive lean on walker.  Assisted with amb in hallway.   VERY unsteady esp with turns.  High stepping LEFT and quick/impulsive speed.  HIGH FALL RISK.   Stairs             Wheelchair Mobility    Modified Rankin (Stroke Patients Only)       Balance                                            Cognition Arousal/Alertness: Awake/alert Behavior During Therapy: Flat affect Overall Cognitive Status: Difficult to assess                                 General Comments: AxO x 3 selective cooperation.  Good day today.  Pt stated she was living with BF she called "a fool" and "It's aweful". Pt agreeable to amb in bathroom then in hallway.        Exercises      General Comments        Pertinent Vitals/Pain Pain Assessment Pain Assessment: No/denies pain    Home Living                          Prior Function            PT Goals (current goals can now be found in the care plan section) Progress towards PT goals: Progressing toward goals    Frequency    Min 2X/week      PT Plan Discharge plan needs to be updated    Co-evaluation              AM-PAC PT "6 Clicks" Mobility   Outcome Measure  Help needed turning from your back to your side while in a flat bed without using bedrails?: A Little Help needed moving from lying on your back to sitting on the side of a flat bed without using bedrails?: A Little Help needed moving to and from a bed to a chair (including a wheelchair)?: A Little Help needed standing up from a chair using your arms (e.g., wheelchair or bedside chair)?: A Lot Help needed to walk in hospital room?: A Lot Help needed climbing 3-5 steps with a railing? : A Lot 6 Click Score: 15    End of Session Equipment Utilized During Treatment: Gait belt Activity  Tolerance: Patient tolerated treatment well Patient left: in chair;with call bell/phone within reach;with chair alarm set Nurse Communication: Mobility status PT Visit Diagnosis: Other abnormalities of gait and mobility (R26.89);Muscle weakness (generalized) (M62.81)     Time: 0272-5366 PT Time Calculation (min) (ACUTE ONLY): 27 min  Charges:  $Gait Training: 8-22 mins $Therapeutic Activity: 8-22 mins                     {Junette Bernat  PTA Acute  Rehabilitation [employer] M-F  [phone] Weekend pager [phone]

## 2022-03-11 NOTE — Plan of Care (Signed)
  Problem: Self-Care: Goal: Ability to participate in self-care as condition permits will improve Outcome: Progressing   

## 2022-03-12 DIAGNOSIS — G459 Transient cerebral ischemic attack, unspecified: Secondary | ICD-10-CM | POA: Diagnosis not present

## 2022-03-12 LAB — CBC
HCT: 40.3 % (ref 36.0–46.0)
Hemoglobin: 12.6 g/dL (ref 12.0–15.0)
MCH: 30.7 pg (ref 26.0–34.0)
MCHC: 31.3 g/dL (ref 30.0–36.0)
MCV: 98.3 fL (ref 80.0–100.0)
Platelets: 354 10*3/uL (ref 150–400)
RBC: 4.1 MIL/uL (ref 3.87–5.11)
RDW: 14.7 % (ref 11.5–15.5)
WBC: 6.6 10*3/uL (ref 4.0–10.5)
nRBC: 0 % (ref 0.0–0.2)

## 2022-03-12 LAB — BASIC METABOLIC PANEL
Anion gap: 12 (ref 5–15)
BUN: 16 mg/dL (ref 6–20)
CO2: 19 mmol/L — ABNORMAL LOW (ref 22–32)
Calcium: 9.2 mg/dL (ref 8.9–10.3)
Chloride: 105 mmol/L (ref 98–111)
Creatinine, Ser: 0.67 mg/dL (ref 0.44–1.00)
GFR, Estimated: >60 mL/min (ref >60)
Glucose, Bld: 176 mg/dL — ABNORMAL HIGH (ref 70–99)
Potassium: 4.1 mmol/L (ref 3.5–5.1)
Sodium: 136 mmol/L (ref 135–145)

## 2022-03-12 LAB — MAGNESIUM: Magnesium: 2.1 mg/dL (ref 1.7–2.4)

## 2022-03-12 NOTE — Progress Notes (Signed)
Occupational Therapy Treatment Patient Details Name: Niza Soderholm MRN: [id number] DOB: [birth year] Today's Date: 03/12/2022   History of present illness Kailan Carmen is a 56 year old female who presented with left sided weakness. MRI revealed "Punctate acute ischemic nonhemorrhagic infarct involving the cortical gray matter of the left temporoparietal region.1.9 x 1.0 x 1.9 cm enhancing right paraclinoid mass, most characteristic of a meningioma." PMH: conversion disorder, schizophrenia, seizures, CVA, depression, PTSD, substance abuse.   OT comments  Chart reviewed, pt greeted in room agreeable to OT tx session. Fair safety awareness throughout with increased time required for one step direction following. Tx session targeted improving functional activity tolerance for ADL tasks. Improvements noted in functional mobility with pt amb into hallway with RW with CGA-MIN A approx 100', amb into bathroom with RW and complete grooming tasks with SET UP. Intermittent buckling of L knee noted during amb. Pt is left in bedside chair, all needs met. Pt is making progress towards goals set fourth.  Discharge recommendation remains appropriate. OT will continue to follow acutely.    Recommendations for follow up therapy are one component of a multi-disciplinary discharge planning process, led by the attending physician.  Recommendations may be updated based on patient status, additional functional criteria and insurance authorization.    Follow Up Recommendations  Skilled nursing-short term rehab (<3 hours/day)     Assistance Recommended at Discharge Frequent or constant Supervision/Assistance  Patient can return home with the following  A little help with bathing/dressing/bathroom;A lot of help with walking and/or transfers;Direct supervision/assist for financial management;Help with stairs or ramp for entrance;Direct supervision/assist for medications management;Assist for transportation;Assistance with  cooking/housework   Equipment Recommendations  BSC/3in1;Tub/shower seat;Other (comment) (per next venue of care)    Recommendations for Other Services      Precautions / Restrictions Precautions Precautions: Fall Precaution Comments: L Hemiparesis Restrictions Weight Bearing Restrictions: No       Mobility Bed Mobility Overal bed mobility: Needs Assistance Bed Mobility: Supine to Sit     Supine to sit: Supervision, HOB elevated          Transfers Overall transfer level: Needs assistance Equipment used: Rolling walker (2 wheels) Transfers: Sit to/from Stand Sit to Stand: Min guard           General transfer comment: ataxic throughout, L knee with intermittent buckling while amb in hallway with RW; pt able to assist LUE onto RW with use of RUE     Balance Overall balance assessment: Needs assistance Sitting-balance support: Feet supported Sitting balance-Leahy Scale: Good     Standing balance support: During functional activity, Reliant on assistive device for balance, Bilateral upper extremity supported Standing balance-Leahy Scale: Poor                             ADL either performed or assessed with clinical judgement   ADL Overall ADL's : Needs assistance/impaired Eating/Feeding: Set up;Sitting   Grooming: Wash/dry hands;Wash/dry face;Set up;Sitting Grooming Details (indicate cue type and reason): in chair in bathroom             Lower Body Dressing: Set up;Sitting/lateral leans Lower Body Dressing Details (indicate cue type and reason): house shoes using B hands Toilet Transfer: Min guard;Minimal assistance;Rolling walker (2 wheels);Ambulation Toilet Transfer Details (indicate cue type and reason): simulated         Functional mobility during ADLs: Min guard;Minimal assistance;Rolling walker (2 wheels) (approx 100' with RW)  Extremity/Trunk Assessment              Vision       Perception     Praxis       Cognition Arousal/Alertness: Awake/alert Behavior During Therapy: WFL for tasks assessed/performed Overall Cognitive Status: No family/caregiver present to determine baseline cognitive functioning Area of Impairment: Following commands, Safety/judgement, Awareness, Problem solving                       Following Commands: Follows one step commands with increased time Safety/Judgement: Decreased awareness of deficits Awareness: Intellectual Problem Solving: Requires verbal cues, Requires tactile cues          Exercises      Shoulder Instructions       General Comments vital signs monitored, appear stable throughout    Pertinent Vitals/ Pain       Pain Assessment Pain Assessment: No/denies pain  Home Living                                          Prior Functioning/Environment              Frequency  Min 2X/week        Progress Toward Goals  OT Goals(current goals can now be found in the care plan section)     Acute Rehab OT Goals Patient Stated Goal: walk to the windo OT Goal Formulation: With patient Time For Goal Achievement: 03/26/22 Potential to Achieve Goals: Good  Plan Discharge plan remains appropriate    Co-evaluation                 AM-PAC OT "6 Clicks" Daily Activity     Outcome Measure   Help from another person eating meals?: None Help from another person taking care of personal grooming?: None Help from another person toileting, which includes using toliet, bedpan, or urinal?: A Little Help from another person bathing (including washing, rinsing, drying)?: A Lot Help from another person to put on and taking off regular upper body clothing?: None Help from another person to put on and taking off regular lower body clothing?: None 6 Click Score: 21    End of Session Equipment Utilized During Treatment: Rolling walker (2 wheels)  OT Visit Diagnosis: Unsteadiness on feet (R26.81);Other abnormalities  of gait and mobility (R26.89);Muscle weakness (generalized) (M62.81)   Activity Tolerance Patient tolerated treatment well   Patient Left in chair;with call bell/phone within reach;with chair alarm set;with nursing/sitter in room   Nurse Communication Mobility status        Time: 3832-9191 OT Time Calculation (min): 17 min  Charges: OT General Charges $OT Visit: 1 Visit OT Treatments $Therapeutic Activity: 8-22 mins  Shanon Payor, OTD OTR/L  03/12/22, 10:46 AM

## 2022-03-12 NOTE — Progress Notes (Signed)
[facility] Progress Note Patient: Joanne Gonzalez GNF:[id number] DOB: [birth year] DOA: 03/01/2022  DOS: the patient was seen and examined on 03/12/2022  Brief hospital course: 56 year old female with a history of conversion disorder.  She presented multiple times 11/16/2019 with complaint of left-sided weakness thought to be functional.  She again presents today with complaints of left-sided weakness.  She has left-sided aural facial droop and lower extremity, noted since day prior to visit. Here for stroke w/u Assessment and Plan: Left-sided weakness. Acute left ischemic nonhemorrhagic infarct and left temporoparietal region. Neurology was consulted. Stroke workup was recommended. LDL 79.  2D echocardiogram shows preserved EF without any wall motion abnormality or valvular abnormality. CT head and CT perfusion negative for any large vessel occlusion. Currently on aspirin and Plavix. Also on Lipitor 40 mg daily. PT OT recommended SNF. Speech therapy was consulted as well. Patient tolerating oral diet. Monitor.  History of substance abuse. Cessation recommended. UDS on admission positive for cocaine and cannabis.  History of schizophrenia. PTSD. Insomnia. Continue current regimen. Change Neurontin to nightly.  Conversion disorder. For now monitor.  Active smoker. Nicotine patch. Recommend to quit smoking.  Vaginal bleeding. Patient reports using multiple tampons yesterday but no bleeding reported. H&H relatively stable.   Pelvic ultrasound shows evidence of endometrial thickening. Outpatient GYN follow-up recommended for endometrial biopsy.  Hypokalemia. Replaced.  Subjective: No nausea no vomiting no fever no chills.  No chest pain.  No abdominal pain.  Per RN no significant vaginal bleeding seen so far.  Physical Exam: General: Appear in mild distress; Cardiovascular: S1 and S2 Present, no Murmur, Respiratory: good respiratory effort, Bilateral Air entry present,  CTA, no Crackles, no wheezes Abdomen: Bowel Sound present, Non tender  Extremities: no Pedal edema Neurology: alert and oriented to time, place, and person  Data Reviewed: I have Reviewed nursing notes, Vitals, and Lab results. BMP stable.  CBC shows hemoglobin of 12.6.  Disposition: Status is: Inpatient Remains inpatient appropriate because: Need SNF placement.  enoxaparin (LOVENOX) injection 40 mg Start: 03/02/22 1000 SCD's Start: 03/02/22 0245   Family Communication: No one at bedside Level of care: Telemetry continue telemetry due to stroke presentation.  Vitals:   03/11/22 0503 03/11/22 2028 03/12/22 0406 03/12/22 1241  BP: 124/77 122/80 106/72 113/78  Pulse: 98 89 97 (!) 101  Resp: '20  19 16  '$ Temp: 97.9 F (36.6 C) 97.6 F (36.4 C) 98 F (36.7 C) 98.3 F (36.8 C)  TempSrc: Oral Oral  Oral  SpO2: 96% 97% 100% 96%  Weight:      Height:         Author: Berle Mull, MD 03/12/2022 7:27 PM  Please look on [email or web address] to find out who is on call.

## 2022-03-12 NOTE — TOC Progression Note (Signed)
Transition of Care [facility]) - Progression Note    Patient Details  Name: Joanne Gonzalez MRN: [id number] Date of Birth: [birth year]  [facility]) CM/SW Contact  Margalit Leece, Juliann Pulse, RN Phone Number: 03/12/2022, 9:59 AM  Clinical Narrative:  Level 2 Pasrr added to fl2-still no bed offers for ST SNF.    Expected Discharge Plan: [place] Barriers to Discharge: Continued Medical Work up  Expected Discharge Plan and Services Expected Discharge Plan: [place]   Discharge Planning Services: CM Consult   Living arrangements for the past 2 months: Single Family Home                                       Social Determinants of Health (SDOH) Interventions    Readmission Risk Interventions    03/05/2022   12:11 PM  Readmission Risk Prevention Plan  Transportation Screening Complete  PCP or Specialist Appt within 3-5 Days Complete  HRI or [place] Complete  Social Work Consult for [place] Planning/Counseling Complete  Palliative Care Screening Not Applicable  Medication Review Press photographer) Complete

## 2022-03-13 MED ORDER — BENZOCAINE 10 % MT GEL
Freq: Four times a day (QID) | OROMUCOSAL | Status: DC | PRN
  Filled 2022-03-13: qty 9.4

## 2022-03-13 NOTE — Progress Notes (Signed)
[facility] PROGRESS NOTE  Patient: Joanne Gonzalez HXT:[id number]   PCP: Pcp, No DOB: 11-14-1965   DOA: 03/01/2022   DOS: 03/13/2022    Subjective: No acute complaint.  No bleeding reported.  Objective:   Vitals:   03/12/22 1955 03/13/22 0624 03/13/22 0624 03/13/22 1325  BP: 124/83 119/73 119/73 122/74  Pulse: 91 (!) 103 (!) 101 92  Resp: '18 18 18 18  '$ Temp: 98.5 F (36.9 C) 98.1 F (36.7 C) 98.1 F (36.7 C) 98.1 F (36.7 C)  TempSrc: Oral Oral Oral Oral  SpO2: 98% 99% 98% 100%  Weight:      Height:        Alert awake and oriented. Some movement seen more on the left lower extremity. Assessment and plan: Active Issues Acute CVA. Pending SNF placement.  Medically stable  Brief Hospital course 56 year old female with a history of conversion disorder.  She presented multiple times 11/16/2019 with complaint of left-sided weakness thought to be functional.  She again presents today with complaints of left-sided weakness.  She has left-sided aural facial droop and lower extremity, noted since day prior to visit. Here for stroke w/u   Principal Problem:   TIA (transient ischemic attack) Active Problems:   Undifferentiated schizophrenia ([place])   Homelessness   Adjustment disorder with depressed mood   Substance abuse ([place])   History of seizures   Cocaine abuse ([place])   Chronic post-traumatic stress disorder (PTSD)   Conversion disorder   Tobacco use   Stroke ([place])   Acute CVA (cerebrovascular accident) [facility])    Author: Berle Mull, MD [facility] 03/13/2022 5:57 PM   If 7PM-7AM, please contact night-coverage at [email or web address]

## 2022-03-14 NOTE — Progress Notes (Signed)
[facility] PROGRESS NOTE  Patient: Joanne Gonzalez [id number]   PCP: Pcp, No DOB: [birth year]   DOA: 03/01/2022   DOS: 03/14/2022    Subjective: No acute complaint.  No nausea or vomiting.  Feeling better.  Objective:  Vitals:   03/13/22 2054 03/14/22 0603 03/14/22 1300 03/14/22 1434  BP: 115/82 111/74    Pulse: 94 87  89  Resp: (!) '21 16 19 16  '$ Temp: 98.7 F (37.1 C) 98.6 F (37 C)  98 F (36.7 C)  TempSrc: Oral Oral  Oral  SpO2: 100% 98%  100%  Weight:      Height:       S1-S2 present. Bowel sound present. No edema.  Assessment and plan: Remains medically stable.  Currently awaiting placement.  Author: Berle Mull, MD [facility] 03/14/2022 8:35 PM   If 7PM-7AM, please contact night-coverage at [email or web address]

## 2022-03-14 NOTE — Progress Notes (Signed)
PT Cancellation Note  Patient Details Name: Joanne Gonzalez MRN: [id number] DOB: 05-15-1965   Cancelled Treatment:    Reason Eval/Treat Not Completed: Patient declined, no reason specified Pt reports dizziness and RN to give tylenol.  Pt politely declined to participate at this time however reports she has been up in room this morning and performing her exercises.   RN confirms pt ambulating to bathroom and washing up.  PT to check back as schedule permits.   Myrtis Hopping [name] 03/14/2022, 1:56 PM Jannette Spanner PT, DPT Physical Therapist Acute Rehabilitation Services Preferred contact method: Secure Chat Weekend Pager Only: [phone] Office: [phone]

## 2022-03-14 NOTE — Plan of Care (Signed)
  Problem: Education: Goal: Knowledge of disease or condition will improve Outcome: Progressing   Problem: Ischemic Stroke/TIA Tissue Perfusion: Goal: Complications of ischemic stroke/TIA will be minimized Outcome: Progressing   Problem: Coping: Goal: Will verbalize positive feelings about self Outcome: Progressing Goal: Will identify appropriate support needs Outcome: Progressing   Problem: Health Behavior/Discharge Planning: Goal: Goals will be collaboratively established with patient/family Outcome: Progressing   Problem: Self-Care: Goal: Ability to communicate needs accurately will improve Outcome: Progressing   Problem: Nutrition: Goal: Dietary intake will improve Outcome: Progressing   Problem: Clinical Measurements: Goal: Ability to maintain clinical measurements within normal limits will improve Outcome: Progressing Goal: Will remain free from infection Outcome: Progressing Goal: Respiratory complications will improve Outcome: Progressing

## 2022-03-15 DIAGNOSIS — G459 Transient cerebral ischemic attack, unspecified: Secondary | ICD-10-CM | POA: Diagnosis not present

## 2022-03-15 MED ORDER — PANTOPRAZOLE SODIUM 40 MG PO TBEC: 40.0000 mg | DELAYED_RELEASE_TABLET | Freq: Every day | ORAL | 0 refills | Status: AC

## 2022-03-15 MED ORDER — SIMETHICONE 80 MG PO CHEW
80.0000 mg | CHEWABLE_TABLET | Freq: Four times a day (QID) | ORAL | Status: DC
  Administered 2022-03-17 (×2): 80 mg via ORAL
  Filled 2022-03-15 (×6): qty 1

## 2022-03-15 MED ORDER — GABAPENTIN 300 MG PO CAPS
300.0000 mg | ORAL_CAPSULE | Freq: Two times a day (BID) | ORAL | Status: DC
  Administered 2022-03-15 – 2022-03-19 (×9): 300 mg via ORAL
  Filled 2022-03-15 (×9): qty 1

## 2022-03-15 MED ORDER — CLOPIDOGREL BISULFATE 75 MG PO TABS: 75.0000 mg | ORAL_TABLET | Freq: Every day | ORAL | 1 refills | Status: AC

## 2022-03-15 MED ORDER — SENNOSIDES-DOCUSATE SODIUM 8.6-50 MG PO TABS
1.0000 | ORAL_TABLET | Freq: Two times a day (BID) | ORAL | Status: DC
  Administered 2022-03-17 (×2): 1 via ORAL
  Filled 2022-03-15 (×7): qty 1

## 2022-03-15 MED ORDER — ASPIRIN 81 MG PO TBEC: 81.0000 mg | DELAYED_RELEASE_TABLET | Freq: Every day | ORAL | 12 refills | Status: AC

## 2022-03-15 MED ORDER — BENZOCAINE 10 % MT GEL: Freq: Four times a day (QID) | OROMUCOSAL | 0 refills | Status: DC | PRN

## 2022-03-15 MED ORDER — ATORVASTATIN CALCIUM 40 MG PO TABS: 40.0000 mg | ORAL_TABLET | Freq: Every day | ORAL | 0 refills | Status: AC

## 2022-03-15 NOTE — Progress Notes (Signed)
Pt refused simethicone and senokot, stating that she did not have any stomach or bowel problems.  Will continue to monitor.

## 2022-03-15 NOTE — TOC Progression Note (Signed)
Transition of Care [facility]) - Progression Note    Patient Details  Name: Joanne Gonzalez MRN: [id number] Date of Birth: February 28, 1966  [facility] CM/SW Contact  Lennart Pall, LCSW Phone Number: 03/15/2022, 3:02 PM  Clinical Narrative:    TOC continues to follow;  no SNF bed offers yet.   Expected Discharge Plan: [place] Barriers to Discharge: Continued Medical Work up  Expected Discharge Plan and Services Expected Discharge Plan: [place]   Discharge Planning Services: CM Consult   Living arrangements for the past 2 months: Single Family Home                                       Social Determinants of Health (SDOH) Interventions    Readmission Risk Interventions    03/05/2022   12:11 PM  Readmission Risk Prevention Plan  Transportation Screening Complete  PCP or Specialist Appt within 3-5 Days Complete  HRI or [place] Complete  Social Work Consult for [place] Planning/Counseling Complete  Palliative Care Screening Not Applicable  Medication Review Press photographer) Complete

## 2022-03-15 NOTE — NC FL2 (Signed)
[place] LEVEL OF CARE FORM     IDENTIFICATION  Patient Name: Joanne Gonzalez Birthdate: [birth year] Sex: female Admission Date (Current Location): 03/01/2022  [facility] and [place] Number:  Herbalist and Address:  [facility],  [address] [place], [place]      Provider Number: [id number]  Attending Physician Name and Address:  Lavina Hamman, MD  Relative Name and Phone Number:   (beasly,ronald Denman George)  [phone] ([place]))    Current Level of Care: Hospital Recommended Level of Care: [place] Prior Approval Number:    Date Approved/Denied:   PASRR Number: [id number]  Discharge Plan: SNF    Current Diagnoses: Patient Active Problem List   Diagnosis Date Noted   Acute CVA (cerebrovascular accident) ([place]) 03/03/2022   TIA (transient ischemic attack) 03/02/2022   Stroke ([place]) 03/02/2022   Dysfunctional uterine bleeding 07/06/2021   Schizophrenia spectrum disorder with psychotic disorder type not yet determined ([place]) 04/14/2021   Adjustment disorder with mixed anxiety and depressed mood 04/13/2021   Cocaine abuse ([place]) 04/13/2021   Crack cocaine use 04/10/2021   Prediabetes 03/09/2021   COVID-19 02/26/2021   History of trichomoniasis 02/26/2021   Substance induced mood disorder ([place]) 02/05/2021   Dysmenorrhea 11/25/2020   Psychophysiological insomnia 11/11/2020   Elevated LDL cholesterol level 11/11/2020   Hypokalemia 11/11/2020   Chronic pain of both hips 11/11/2020   Vitamin D deficiency 11/11/2020   Trichimoniasis 11/11/2020   Stroke (cerebrum) ([place]) 02/10/2019   Hypocalcemia 02/10/2019   History of seizures 02/10/2019   Homelessness 11/26/2018   Adjustment disorder with depressed mood 11/26/2018   Substance abuse ([place])    Insect bite of left leg, initial encounter 06/24/2018   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   Tobacco use 01/21/2016   H/O: CVA (cerebrovascular accident) 09/26/2015   Chest pain  09/26/2015   Chronic post-traumatic stress disorder (PTSD) 11/14/2014   Acute cystitis without hematuria 11/12/2014   Iron deficiency anemia 09/01/2014   Hyperglycemia 09/01/2014   Undifferentiated schizophrenia ([place]) 08/31/2014   Left-sided weakness 08/30/2014   History of diabetes mellitus, type II 11/21/2013   Hemiplegia, unspecified, affecting nondominant side 04/13/2013    Orientation RESPIRATION BLADDER Height & Weight     Self, Time, Situation, Place  Normal External catheter Weight: 156 lb (70.8 kg) Height:  '5\' 5"'$  (165.1 cm)  BEHAVIORAL SYMPTOMS/MOOD NEUROLOGICAL BOWEL NUTRITION STATUS      Continent Diet (CHO MOD)  AMBULATORY STATUS COMMUNICATION OF NEEDS Skin   Limited Assist Verbally Normal                       Personal Care Assistance Level of Assistance  Bathing, Feeding, Dressing Bathing Assistance: Limited assistance Feeding assistance: Limited assistance Dressing Assistance: Limited assistance     Functional Limitations Info  Sight, Hearing, Speech Sight Info: Adequate Hearing Info: Adequate Speech Info: Adequate    SPECIAL CARE FACTORS FREQUENCY  PT (By licensed PT), OT (By licensed OT)     PT Frequency:  (5x week) OT Frequency:  (5xweek)            Contractures Contractures Info: Not present    Additional Factors Info  Code Status, Allergies, Psychotropic Code Status Info:  (Full) Allergies Info:  (Keflex (Cephalexin), Penicillins, Risperdal (Risperidone), Bactrim (Sulfamethoxazole-trimethoprim), Lactose Intolerance (Gi)) Psychotropic Info:  (Cogentin)         Current Medications (03/15/2022):  This is the current hospital active medication list Current Facility-Administered Medications  Medication Dose  Route Frequency Provider Last Rate Last Admin   acetaminophen (TYLENOL) tablet 650 mg  650 mg Oral Q4H PRN Crosley, Debby, MD   650 mg at 03/15/22 0834   Or   acetaminophen (TYLENOL) 160 MG/5ML solution 650 mg  650 mg Per Tube Q4H  PRN Crosley, Debby, MD       Or   acetaminophen (TYLENOL) suppository 650 mg  650 mg Rectal Q4H PRN Crosley, Debby, MD       aspirin EC tablet 81 mg  81 mg Oral Daily de Yolanda Manges, Wells E, NP   81 mg at 03/15/22 1032   Or   aspirin suppository 300 mg  300 mg Rectal Daily de Yolanda Manges, Cortney E, NP       atorvastatin (LIPITOR) tablet 40 mg  40 mg Oral Daily de Yolanda Manges, Cortney E, NP   40 mg at 03/15/22 1032   benzocaine (ORAJEL) 10 % mucosal gel   Mouth/Throat QID PRN Raenette Rover, NP   Given at 03/13/22 0553   benztropine (COGENTIN) tablet 0.5 mg  0.5 mg Oral BID PRN Eugenie Filler, MD       clopidogrel (PLAVIX) tablet 75 mg  75 mg Oral Daily de Yolanda Manges, Cortney E, NP   75 mg at 03/15/22 1032   enoxaparin (LOVENOX) injection 40 mg  40 mg Subcutaneous Q24H Crosley, Debby, MD   40 mg at 03/15/22 1032   ferrous sulfate tablet 325 mg  325 mg Oral Q breakfast Eugenie Filler, MD   325 mg at 03/15/22 0834   gabapentin (NEURONTIN) capsule 300 mg  300 mg Oral BID Lavina Hamman, MD   300 mg at 03/15/22 1032   lip balm (CARMEX) ointment   Topical PRN Eugenie Filler, MD   75 Application at 59/74/16 2028   pantoprazole (PROTONIX) EC tablet 40 mg  40 mg Oral Daily Eugenie Filler, MD   40 mg at 03/15/22 1032   QUEtiapine (SEROQUEL) tablet 100 mg  100 mg Oral QHS Eugenie Filler, MD   100 mg at 03/14/22 2146   senna-docusate (Senokot-S) tablet 1 tablet  1 tablet Oral QHS PRN Quintella Baton, MD         Discharge Medications: Please see discharge summary for a list of discharge medications.  Relevant Imaging Results:  Relevant Lab Results:   Additional Information  (ss#[phone])  Luca Burston, LCSW

## 2022-03-15 NOTE — Progress Notes (Signed)
Physical Therapy Treatment Patient Details Name: Joanne Gonzalez MRN: [id number] DOB: 08-Jul-1965 Today's Date: 03/15/2022   History of Present Illness Joanne Gonzalez is a 56 year old female who presented with left sided weakness. MRI revealed "Punctate acute ischemic nonhemorrhagic infarct involving the cortical gray matter of the left temporoparietal region.1.9 x 1.0 x 1.9 cm enhancing right paraclinoid mass, most characteristic of a meningioma." PMH: conversion disorder, schizophrenia, seizures, CVA, depression, PTSD, substance abuse.    PT Comments    Pt assisted with ambulating in hallway and requiring mod assist with deficits in strength and gait pattern observed bilaterally.  Continue to recommend SNF upon d/c.    Recommendations for follow up therapy are one component of a multi-disciplinary discharge planning process, led by the attending physician.  Recommendations may be updated based on patient status, additional functional criteria and insurance authorization.  Follow Up Recommendations  Skilled nursing-short term rehab (<3 hours/day) Can patient physically be transported by private vehicle: No   Assistance Recommended at Discharge Frequent or constant Supervision/Assistance  Patient can return home with the following A little help with bathing/dressing/bathroom;Assistance with cooking/housework;Assist for transportation;A lot of help with walking and/or transfers   Equipment Recommendations  None recommended by PT    Recommendations for Other Services       Precautions / Restrictions Precautions Precautions: Fall Precaution Comments: L Hemiparesis     Mobility  Bed Mobility Overal bed mobility: Needs Assistance Bed Mobility: Supine to Sit, Sit to Supine     Supine to sit: Supervision, HOB elevated Sit to supine: Supervision, HOB elevated   General bed mobility comments: pt self assists Lt side with R UE, poor control of movement (flings weaker side for placing)     Transfers Overall transfer level: Needs assistance Equipment used: Rolling walker (2 wheels) Transfers: Sit to/from Stand Sit to Stand: Min assist           General transfer comment: assist to rise and steady as well as control descent, pt "assisted" ("flung") Lt hand to place on RW, pt able to hold onto RW    Ambulation/Gait Ambulation/Gait assistance: Mod assist Gait Distance (Feet): 50 Feet Assistive device: Rolling walker (2 wheels) Gait Pattern/deviations: Decreased stance time - left, Step-to pattern, Decreased dorsiflexion - right, Decreased dorsiflexion - left, Knee flexed in stance - right, Knee flexed in stance - left, Staggering left Gait velocity: decreased     General Gait Details: observed bilateral vaulting, cued for step to with leading of Lt LE however pt not able to lift LE to advance, preferred leading with Rt LE so cued to allow Lt LE to catch up, bil knees buckling throughout gait, provided mod assist at times however pt states she will not fall   Stairs             Wheelchair Mobility    Modified Rankin (Stroke Patients Only)       Balance Overall balance assessment: Needs assistance         Standing balance support: During functional activity, Reliant on assistive device for balance, Bilateral upper extremity supported Standing balance-Leahy Scale: Poor                              Cognition Arousal/Alertness: Awake/alert Behavior During Therapy: WFL for tasks assessed/performed Overall Cognitive Status: No family/caregiver present to determine baseline cognitive functioning Area of Impairment: Following commands, Safety/judgement, Problem solving  Following Commands: Follows one step commands with increased time Safety/Judgement: Decreased awareness of deficits, Decreased awareness of safety   Problem Solving: Requires verbal cues, Requires tactile cues          Exercises       General Comments        Pertinent Vitals/Pain Pain Assessment Pain Assessment: No/denies pain    Home Living                          Prior Function            PT Goals (current goals can now be found in the care plan section) Progress towards PT goals: Progressing toward goals    Frequency    Min 2X/week      PT Plan Current plan remains appropriate    Co-evaluation              AM-PAC PT "6 Clicks" Mobility   Outcome Measure  Help needed turning from your back to your side while in a flat bed without using bedrails?: A Little Help needed moving from lying on your back to sitting on the side of a flat bed without using bedrails?: A Little Help needed moving to and from a bed to a chair (including a wheelchair)?: A Little Help needed standing up from a chair using your arms (e.g., wheelchair or bedside chair)?: A Lot Help needed to walk in hospital room?: A Lot Help needed climbing 3-5 steps with a railing? : A Lot 6 Click Score: 15    End of Session Equipment Utilized During Treatment: Gait belt Activity Tolerance: Patient tolerated treatment well Patient left: in bed;with call bell/phone within reach;with bed alarm set Nurse Communication: Mobility status PT Visit Diagnosis: Other abnormalities of gait and mobility (R26.89);Muscle weakness (generalized) (M62.81)     Time: 5462-7035 PT Time Calculation (min) (ACUTE ONLY): 17 min  Charges:  $Gait Training: 8-22 mins                    Jannette Spanner PT, DPT Physical Therapist Acute Rehabilitation Services Preferred contact method: Secure Chat Weekend Pager Only: [phone] Office: [place] 03/15/2022, 3:44 PM

## 2022-03-15 NOTE — Progress Notes (Addendum)
[facility] PROGRESS NOTE  Patient: Joanne Gonzalez DSK:[id number]   PCP: Pcp, No DOB: May 16, 1965   DOA: 03/01/2022   DOS: 03/15/2022    Subjective: Reported severe abdominal pain as well as distention.  Passing gas.  Occasional nausea without any vomiting.  Objective:   Vitals:   03/14/22 1434 03/14/22 2042 03/15/22 0528 03/15/22 1351  BP:  116/68 107/60 119/78  Pulse: 89 88 96 99  Resp: '16 20 18 18  '$ Temp: 98 F (36.7 C) 98.3 F (36.8 C) 98.3 F (36.8 C) 98.1 F (36.7 C)  TempSrc: Oral Oral  Oral  SpO2: 100% 97% 99% 98%  Weight:      Height:        S1-S2 present. Bowel sound present. No tenderness the time of my evaluation.  Assessment and plan: Active Issues Bloating. Initiate simethicone. Initiate stool softener. Last BM was reported 2 days ago.  Dental pain. Will increase gabapentin from nightly dose to twice daily dose and monitor.  Remains medically stable for discharge.  Brief Hospital course 56 year old female with a history of conversion disorder.  She presented multiple times 11/16/2019 with complaint of left-sided weakness thought to be functional.  She again presents today with complaints of left-sided weakness.  She has left-sided aural facial droop and lower extremity, noted since day prior to visit. Here for stroke w/u   Principal Problem:   TIA (transient ischemic attack) Active Problems:   Undifferentiated schizophrenia ([place])   Homelessness   Adjustment disorder with depressed mood   Substance abuse ([place])   History of seizures   Cocaine abuse ([place])   Chronic post-traumatic stress disorder (PTSD)   Conversion disorder   Tobacco use   Stroke ([place])   Acute CVA (cerebrovascular accident) [facility])    Author: Berle Mull, MD [facility] 03/15/2022 7:07 PM   If 7PM-7AM, please contact night-coverage at [email or web address]

## 2022-03-16 DIAGNOSIS — G459 Transient cerebral ischemic attack, unspecified: Secondary | ICD-10-CM | POA: Diagnosis not present

## 2022-03-16 MED ORDER — CHLORHEXIDINE GLUCONATE 0.12 % MT SOLN
15.0000 mL | Freq: Two times a day (BID) | OROMUCOSAL | Status: DC
  Administered 2022-03-16 – 2022-03-19 (×5): 15 mL via OROMUCOSAL
  Filled 2022-03-16 (×5): qty 15

## 2022-03-16 NOTE — Progress Notes (Signed)
[facility] Progress Note Patient: Joanne Gonzalez OZD:[id number] DOB: Apr 09, 1965 DOA: 03/01/2022  DOS: the patient was seen and examined on 03/16/2022  Brief hospital course: 56 year old female with a history of conversion disorder.  She presented multiple times 11/16/2019 with complaint of left-sided weakness thought to be functional.  She again presents today with complaints of left-sided weakness.  She has left-sided aural facial droop and lower extremity, noted since day prior to visit. Here for stroke w/u.  Medically stable.  Unsafe discharge home therefore currently awaiting SNF placement in the hospital. Assessment and Plan: Left-sided weakness. Acute left ischemic nonhemorrhagic infarct and left temporoparietal region. Neurology was consulted. Stroke workup was recommended. LDL 79.  2D echocardiogram shows preserved EF without any wall motion abnormality or valvular abnormality. CT head and CT perfusion negative for any large vessel occlusion. Currently on aspirin and Plavix. Also on Lipitor 40 mg daily. PT OT recommended SNF. Speech therapy was consulted as well. Patient tolerating oral diet. Monitor.  History of substance abuse. Cessation recommended. UDS on admission positive for cocaine and cannabis.  History of schizophrenia. PTSD. Insomnia. Continue current regimen. Change Neurontin to nightly.  Conversion disorder. For now monitor.  Active smoker. Nicotine patch. Recommend to quit smoking.  Vaginal bleeding. Patient reports using multiple tampons yesterday but no bleeding reported. H&H relatively stable.   Pelvic ultrasound shows evidence of endometrial thickening. Outpatient GYN follow-up recommended for endometrial biopsy.  Hypokalemia. Replaced.  Dental pain. Will increase gabapentin from nightly dose to twice daily dose and monitor. Continue Orajel.  Chlorhexidine gargles.  Subjective: No acute but no nausea no vomiting.  Continues to have  dental pain.  Feels her face is tight.  Physical Exam: Clear to auscultation. Bowel sound present.  No edema.  Data Reviewed: I have Reviewed nursing notes, Vitals, and Lab results.  Disposition: Status is: Inpatient Remains inpatient appropriate because: Need SNF placement.  enoxaparin (LOVENOX) injection 40 mg Start: 03/02/22 1000 SCD's Start: 03/02/22 0245   Family Communication: No one at bedside Level of care: Telemetry continue telemetry due to stroke presentation.  Vitals:   03/15/22 1351 03/15/22 1958 03/16/22 0433 03/16/22 1237  BP: 119/78 135/72 109/63 114/73  Pulse: 99 (!) 102 (!) 104 93  Resp: '18 18 16 16  '$ Temp: 98.1 F (36.7 C) 98.4 F (36.9 C) 98 F (36.7 C) 98.6 F (37 C)  TempSrc: Oral Oral  Oral  SpO2: 98% 97% 98% 95%  Weight:      Height:         Author: Berle Mull, MD 03/16/2022 8:56 PM  Please look on [email or web address] to find out who is on call.

## 2022-03-16 NOTE — TOC Progression Note (Addendum)
Transition of Care [facility]) - Progression Note    Patient Details  Name: Joanne Gonzalez MRN: [id number] Date of Birth: Jul 26, 1965  Transition of Care [facility]) CM/SW Contact  Tieler Cournoyer, Juliann Pulse, RN Phone Number: 03/16/2022, 11:16 AM  Clinical Narrative: No bed offers. Supv aware.  Patient receives SSI, states she can go home if needed. -received message from ACT Team rep Altha Harm [phone]-she is looking into housing-she is aware to asst with facilitating d/c-if bed offer available prior housing-ACT team will follow post d/c.    Expected Discharge Plan: [place] Barriers to Discharge: Continued Medical Work up  Expected Discharge Plan and Services Expected Discharge Plan: [place]   Discharge Planning Services: CM Consult   Living arrangements for the past 2 months: Single Family Home                                       Social Determinants of Health (SDOH) Interventions    Readmission Risk Interventions    03/05/2022   12:11 PM  Readmission Risk Prevention Plan  Transportation Screening Complete  PCP or Specialist Appt within 3-5 Days Complete  HRI or [place] Complete  Social Work Consult for [place] Planning/Counseling Complete  Palliative Care Screening Not Applicable  Medication Review Press photographer) Complete

## 2022-03-16 NOTE — Progress Notes (Signed)
Pt family dropped off 2 meds.  Meds taken to pharmacy.

## 2022-03-17 DIAGNOSIS — I639 Cerebral infarction, unspecified: Secondary | ICD-10-CM | POA: Diagnosis not present

## 2022-03-17 DIAGNOSIS — G459 Transient cerebral ischemic attack, unspecified: Secondary | ICD-10-CM | POA: Diagnosis not present

## 2022-03-17 DIAGNOSIS — F449 Dissociative and conversion disorder, unspecified: Secondary | ICD-10-CM | POA: Diagnosis not present

## 2022-03-17 DIAGNOSIS — Z87898 Personal history of other specified conditions: Secondary | ICD-10-CM

## 2022-03-17 LAB — CBC
HCT: 38.2 % (ref 36.0–46.0)
Hemoglobin: 12.3 g/dL (ref 12.0–15.0)
MCH: 30.9 pg (ref 26.0–34.0)
MCHC: 32.2 g/dL (ref 30.0–36.0)
MCV: 96 fL (ref 80.0–100.0)
Platelets: 381 K/uL (ref 150–400)
RBC: 3.98 MIL/uL (ref 3.87–5.11)
RDW: 15.7 % — ABNORMAL HIGH (ref 11.5–15.5)
WBC: 9.1 K/uL (ref 4.0–10.5)
nRBC: 0 % (ref 0.0–0.2)

## 2022-03-17 LAB — BASIC METABOLIC PANEL WITH GFR
Anion gap: 10 (ref 5–15)
BUN: 17 mg/dL (ref 6–20)
CO2: 24 mmol/L (ref 22–32)
Calcium: 9.1 mg/dL (ref 8.9–10.3)
Chloride: 104 mmol/L (ref 98–111)
Creatinine, Ser: 0.71 mg/dL (ref 0.44–1.00)
GFR, Estimated: >60 mL/min
Glucose, Bld: 127 mg/dL — ABNORMAL HIGH (ref 70–99)
Potassium: 4 mmol/L (ref 3.5–5.1)
Sodium: 138 mmol/L (ref 135–145)
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
## 2022-03-17 NOTE — Plan of Care (Signed)
  Problem: Health Behavior/Discharge Planning: Goal: Ability to manage health-related needs will improve Outcome: Progressing   
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
## 2022-03-17 NOTE — Progress Notes (Signed)
OT Cancellation Note  Patient Details Name: Jazline Cumbee MRN: [id number] DOB: Jul 13, 1965   Cancelled Treatment:    Reason Eval/Treat Not Completed: Other (comment). Patietn declined therapy stating she is "too stressed out right now about where I'm going to go." I'm getting that headache, like I did when I had the stroke." RN informed of patient's headache and concerns.  Jadon Ressler L Mileena Rothenberger 03/17/2022, 3:54 PM

## 2022-03-17 NOTE — Progress Notes (Signed)
[facility]  PROGRESS NOTE  Joanne Gonzalez DPO:[id number] DOB: 1965-08-02 DOA: 03/01/2022 PCP: Pcp, No   Brief HPI:   56 year old female with a history of conversion disorder.  She presented multiple times 11/16/2019 with complaint of left-sided weakness thought to be functional.  She again presents today with complaints of left-sided weakness.  She has left-sided aural facial droop and lower extremity, noted since day prior to visit. Here for stroke w/u.  Medically stable.  Unsafe discharge home therefore currently awaiting SNF placement     Subjective   Patient seen and examined, denies any complaints.   Assessment/Plan:   Left-sided weakness -Acute left ischemic nonhemorrhagic infarct in the left temporoparietal region -Infarct does not explain patient's symptoms and is likely incidental -Patient's left-sided weakness appears functional as per neurology with positive Hoover sign on exam. -Given small size of stroke it was felt it was due to small vessel disease and not embolic in origin, no need for TEE at this time -Continue atorvastatin 40 mg daily, aspirin 81 mg daily, Plavix 75 mg daily  Small meningioma seen on MRI -Will need outpatient follow-up with neurosurgery  History of substance abuse. Cessation recommended. UDS on admission positive for cocaine and cannabis.   History of schizophrenia. PTSD. Insomnia. Continue current regimen. Change Neurontin to nightly.   Conversion disorder. For now monitor.   Active smoker. Nicotine patch. Recommend to quit smoking.   Vaginal bleeding. Patient reports using multiple tampons yesterday but no bleeding reported. H&H relatively stable.   Pelvic ultrasound shows evidence of endometrial thickening. Outpatient GYN follow-up recommended for endometrial biopsy.   Hypokalemia. Replaced.   Dental pain.  Gabapentin 300 mg twice daily. Continue Orajel.   Chlorhexidine gargles.      Medications     aspirin  EC  81 mg Oral Daily   Or   aspirin  300 mg Rectal Daily   atorvastatin  40 mg Oral Daily   chlorhexidine  15 mL Mouth/Throat BID   clopidogrel  75 mg Oral Daily   enoxaparin (LOVENOX) injection  40 mg Subcutaneous Q24H   ferrous sulfate  325 mg Oral Q breakfast   gabapentin  300 mg Oral BID   pantoprazole  40 mg Oral Daily   QUEtiapine  100 mg Oral QHS   senna-docusate  1 tablet Oral BID   simethicone  80 mg Oral QID     Data Reviewed:   CBG:  No results for input(s): "GLUCAP" in the last 168 hours.  SpO2: 96 %    Vitals:   03/16/22 0433 03/16/22 1237 03/16/22 2156 03/17/22 0433  BP: 109/63 114/73 (!) 140/87 107/64  Pulse: (!) 104 93 94 99  Resp: '16 16 15 19  '$ Temp: 98 F (36.7 C) 98.6 F (37 C) 98.3 F (36.8 C) 98.6 F (37 C)  TempSrc:  Oral Oral Oral  SpO2: 98% 95% 98% 96%  Weight:      Height:          Data Reviewed:  Basic Metabolic Panel: Recent Labs  Lab 03/12/22 0521 03/17/22 0459  NA 136 138  K 4.1 4.0  CL 105 104  CO2 19* 24  GLUCOSE 176* 127*  BUN 16 17  CREATININE 0.67 0.71  CALCIUM 9.2 9.1  MG 2.1  --     CBC: Recent Labs  Lab 03/12/22 0521 03/17/22 0459  WBC 6.6 9.1  HGB 12.6 12.3  HCT 40.3 38.2  MCV 98.3 96.0  PLT 354 381    LFT No  results for input(s): "AST", "ALT", "ALKPHOS", "BILITOT", "PROT", "ALBUMIN" in the last 168 hours.   Antibiotics: Anti-infectives (From admission, onward)    None        DVT prophylaxis: Lovenox  Code Status: Full code  Family Communication: No family at bedside   CONSULTS    Objective    Physical Examination:   General: Appears in no acute distress Cardiovascular: S1-S2, regular Respiratory: Clear to auscultation bilaterally Abdomen: Soft, nontender, no organomegaly Extremities: No edema in the lower extremities Neurologic: Alert, oriented x 3, motor strength 3/5 in left upper and lower extremity   Status is: Inpatient:             Oswald Hillock   [facility] If 7PM-7AM, please contact night-coverage at [email or web address], Office  [phone]   03/17/2022, 6:10 PM  LOS: 15 days

## 2022-03-18 DIAGNOSIS — F449 Dissociative and conversion disorder, unspecified: Secondary | ICD-10-CM | POA: Diagnosis not present

## 2022-03-18 DIAGNOSIS — I639 Cerebral infarction, unspecified: Secondary | ICD-10-CM | POA: Diagnosis not present

## 2022-03-18 DIAGNOSIS — Z87898 Personal history of other specified conditions: Secondary | ICD-10-CM | POA: Diagnosis not present

## 2022-03-18 DIAGNOSIS — G459 Transient cerebral ischemic attack, unspecified: Secondary | ICD-10-CM | POA: Diagnosis not present

## 2022-03-18 NOTE — Progress Notes (Signed)
[facility]  PROGRESS NOTE  Joanne Gonzalez KYH:[id number] DOB: 09-Jun-1965 DOA: 03/01/2022 PCP: Pcp, No   Brief HPI:   56 year old female with a history of conversion disorder.  She presented multiple times 11/16/2019 with complaint of left-sided weakness thought to be functional.  She again presents today with complaints of left-sided weakness.  She has left-sided aural facial droop and lower extremity, noted since day prior to visit. Here for stroke w/u.  Medically stable.  Unsafe discharge home therefore currently awaiting SNF placement     Subjective   Patient seen and examined, denies any complaints.   Assessment/Plan:   Left-sided weakness -Acute left ischemic nonhemorrhagic infarct in the left temporoparietal region -Infarct does not explain patient's symptoms and is likely incidental -Patient's left-sided weakness appears functional as per neurology with positive Hoover sign on exam. -Given small size of stroke it was felt it was due to small vessel disease and not embolic in origin, no need for TEE at this time -Continue atorvastatin 40 mg daily, aspirin 81 mg daily, Plavix 75 mg daily  Small meningioma seen on MRI -Will need outpatient follow-up with neurosurgery  History of substance abuse. Cessation recommended. UDS on admission positive for cocaine and cannabis.   History of schizophrenia. PTSD. Insomnia. Continue current regimen. Change Neurontin to nightly.   Conversion disorder. For now monitor.   Active smoker. Nicotine patch. Recommend to quit smoking.   Vaginal bleeding. Patient reports using multiple tampons yesterday but no bleeding reported. H&H relatively stable.   Pelvic ultrasound shows evidence of endometrial thickening. Outpatient GYN follow-up recommended for endometrial biopsy.   Hypokalemia. Replaced.   Dental pain.  Gabapentin 300 mg twice daily. Continue Orajel.   Chlorhexidine gargles.      Medications     aspirin  EC  81 mg Oral Daily   Or   aspirin  300 mg Rectal Daily   atorvastatin  40 mg Oral Daily   chlorhexidine  15 mL Mouth/Throat BID   clopidogrel  75 mg Oral Daily   enoxaparin (LOVENOX) injection  40 mg Subcutaneous Q24H   ferrous sulfate  325 mg Oral Q breakfast   gabapentin  300 mg Oral BID   pantoprazole  40 mg Oral Daily   QUEtiapine  100 mg Oral QHS   senna-docusate  1 tablet Oral BID   simethicone  80 mg Oral QID     Data Reviewed:   CBG:  No results for input(s): "GLUCAP" in the last 168 hours.  SpO2: 97 %    Vitals:   03/18/22 0300 03/18/22 0400 03/18/22 0500 03/18/22 0511  BP:    (!) 142/77  Pulse:    (!) 104  Resp: 19 19 (!) 21 18  Temp:    98.1 F (36.7 C)  TempSrc:    Oral  SpO2:    97%  Weight:      Height:          Data Reviewed:  Basic Metabolic Panel: Recent Labs  Lab 03/12/22 0521 03/17/22 0459  NA 136 138  K 4.1 4.0  CL 105 104  CO2 19* 24  GLUCOSE 176* 127*  BUN 16 17  CREATININE 0.67 0.71  CALCIUM 9.2 9.1  MG 2.1  --     CBC: Recent Labs  Lab 03/12/22 0521 03/17/22 0459  WBC 6.6 9.1  HGB 12.6 12.3  HCT 40.3 38.2  MCV 98.3 96.0  PLT 354 381    LFT No results for input(s): "AST", "ALT", "ALKPHOS", "BILITOT", "PROT", "  ALBUMIN" in the last 168 hours.   Antibiotics: Anti-infectives (From admission, onward)    None        DVT prophylaxis: Lovenox  Code Status: Full code  Family Communication: No family at bedside   CONSULTS    Objective    Physical Examination:  General-appears in no acute distress Heart-S1-S2, regular, no murmur auscultated Lungs-clear to auscultation bilaterally, no wheezing or crackles auscultated Abdomen-soft, nontender, no organomegaly Extremities-no edema in the lower extremities Neuro-alert, oriented x3, motor strength 3/5 in both left upper and lower extremity   Status is: Inpatient:             Oswald Hillock   [facility] If 7PM-7AM, please contact  night-coverage at [email or web address], Office  [phone]   03/18/2022, 9:52 AM  LOS: 16 days

## 2022-03-18 NOTE — Progress Notes (Signed)
Occupational Therapy Treatment Patient Details Name: Altagracia Rone MRN: [id number] DOB: [birth year] Today's Date: 03/18/2022   History of present illness Raphaella Larkin is a 56 year old female who presented with left sided weakness. MRI revealed "Punctate acute ischemic nonhemorrhagic infarct involving the cortical gray matter of the left temporoparietal region.1.9 x 1.0 x 1.9 cm enhancing right paraclinoid mass, most characteristic of a meningioma." PMH: conversion disorder, schizophrenia, seizures, CVA, depression, PTSD, substance abuse.   OT comments  Patient reported feelings of increased LUE and LLE weakness today, which she believes could be at least partly due to personal stressors. She required min assist for supine to sit & sit to stand using a RW. She presented with moderate unsteadiness and feelings of LLE buckling in dynamic standing, requiring steadying assist & cues for walker placement and pushing through BUE. She was further instructed on LUE and LLE ROM & stretches to promote improved joint flexibility and strengthening needed for progressive ADL participation. Continue OT plan of care to facilitate progressive ADL performance.    Recommendations for follow up therapy are one component of a multi-disciplinary discharge planning process, led by the attending physician.  Recommendations may be updated based on patient status, additional functional criteria and insurance authorization.    Follow Up Recommendations  Skilled nursing-short term rehab (<3 hours/day)     Assistance Recommended at Discharge Frequent or constant Supervision/Assistance  Patient can return home with the following  A little help with bathing/dressing/bathroom;A lot of help with walking and/or transfers;Help with stairs or ramp for entrance;Assist for transportation;Assistance with cooking/housework;Direct supervision/assist for medications management   Equipment Recommendations  BSC/3in1;Tub/shower  seat;Other (comment)       Precautions / Restrictions Precautions Precautions: Fall Restrictions Weight Bearing Restrictions: No       Mobility Bed Mobility Overal bed mobility: Needs Assistance Bed Mobility: Supine to Sit, Sit to Supine     Supine to sit: HOB elevated Sit to supine: Supervision   General bed mobility comments: Pt required assist for LLE management, in order to perform supine to sit    Transfers Overall transfer level: Needs assistance Equipment used: Rolling walker (2 wheels) Transfers: Sit to/from Stand Sit to Stand: Min assist           General transfer comment: assist to rise and steady, manual assist for LUE placement on walker         ADL either performed or assessed with clinical judgement   ADL Overall ADL's : Needs assistance/impaired Eating/Feeding: Set up;Sitting   Grooming: Set up;Sitting           Upper Body Dressing : Minimal assistance;Sitting     Lower Body Dressing Details (indicate cue type and reason): Patient donned her slippers seated EOB                     Cognition Arousal/Alertness: Awake/alert Behavior During Therapy: WFL for tasks assessed/performed                      Pertinent Vitals/ Pain       Pain Assessment Pain Assessment: No/denies pain         Frequency  Min 2X/week        Progress Toward Goals  OT Goals(current goals can now be found in the care plan section)  Progress towards OT goals: Progressing toward goals  Acute Rehab OT Goals Patient Stated Goal: to get better OT Goal Formulation: With patient Time For Goal Achievement: 03/26/22 Potential  to Achieve Goals: Good  Plan Discharge plan remains appropriate       AM-PAC OT "6 Clicks" Daily Activity     Outcome Measure   Help from another person eating meals?: None Help from another person taking care of personal grooming?: A Little Help from another person toileting, which includes using toliet, bedpan, or  urinal?: A Little Help from another person bathing (including washing, rinsing, drying)?: A Lot Help from another person to put on and taking off regular upper body clothing?: A Little Help from another person to put on and taking off regular lower body clothing?: A Little 6 Click Score: 18    End of Session Equipment Utilized During Treatment: Rolling walker (2 wheels);Gait belt  OT Visit Diagnosis: Unsteadiness on feet (R26.81);Muscle weakness (generalized) (M62.81)   Activity Tolerance Patient tolerated treatment well   Patient Left with call bell/phone within reach;in bed;with bed alarm set   Nurse Communication Mobility status        Time: 3094-0768 OT Time Calculation (min): 22 min  Charges: OT General Charges $OT Visit: 1 Visit OT Treatments $Therapeutic Activity: 8-22 mins      Leota Sauers, OTR/L 03/18/2022, 2:04 PM

## 2022-03-19 DIAGNOSIS — I639 Cerebral infarction, unspecified: Secondary | ICD-10-CM | POA: Diagnosis not present

## 2022-03-19 DIAGNOSIS — F1410 Cocaine abuse, uncomplicated: Secondary | ICD-10-CM | POA: Diagnosis not present

## 2022-03-19 MED ORDER — ORAL CARE MOUTH RINSE: 15.0000 mL | OROMUCOSAL | Status: DC | PRN

## 2022-03-19 NOTE — TOC Transition Note (Signed)
Transition of Care [facility]) - CM/SW Discharge Note   Patient Details  Name: Joanne Gonzalez MRN: [id number] Date of Birth: 03-28-1966  [facility]) CM/SW Contact:  Henrietta Dine, RN Phone Number: 03/19/2022, 1:13 PM   Clinical Narrative:    Pt will d/c to [place], [place]; order received for RW; notified by Sheryle Spray at [employer] will not pay b/c pt  had one covered within 5 years; she says the pt would have to pay private $133.03; pt notified and declined; the pt says she thinks her godmother has one; pt notified taxi will be called; [employer] called at 108 pm; spoke w/ [place]; pt will be picked up at [facility] ED entrance by cab #[id number]; he was also given Shauna Hugh, RN # [phone]) to call on arrival; taxi voucher given to [place], [place]; no TOC needs.   Final next level of care: Home/Self Care Barriers to Discharge: No Barriers Identified   Patient Goals and CMS Choice CMS Medicare.gov Compare Post Acute Care list provided to:: Patient Choice offered to / list presented to : Patient  Discharge Placement                         Discharge Plan and Services Additional resources added to the After Visit Summary for     Discharge Planning Services: CM Consult                                 Social Determinants of Health (SDOH) Interventions SDOH Screenings   Food Insecurity: No Food Insecurity (03/05/2022)  Housing: Low Risk  (03/05/2022)  Transportation Needs: No Transportation Needs (03/05/2022)  Utilities: Not At Risk (03/05/2022)  Alcohol Screen: Low Risk  (04/13/2021)  Depression (PHQ2-9): High Risk (07/06/2021)  Tobacco Use: Medium Risk (03/03/2022)     Readmission Risk Interventions    03/05/2022   12:11 PM  Readmission Risk Prevention Plan  Transportation Screening Complete  PCP or Specialist Appt within 3-5 Days Complete  HRI or [place] Complete  Social Work Consult for [place] Planning/Counseling Complete   Palliative Care Screening Not Applicable  Medication Review Press photographer) Complete

## 2022-03-19 NOTE — Progress Notes (Signed)
Physical Therapy Note. Patient in room, packing her things and states that she does not want a WC but needs a RW, states  that she has a place to stay and that she is not going to [facility]. TOC notified. [place] Office [phone] Weekend [phone]
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
## 2022-03-19 NOTE — Discharge Summary (Signed)
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 circulation: Bilateral intracranial ICAs, MCAs, and ACAs are patent without proximal hemodynamically significant stenosis. Posterior circulation: Bilateral intradural vertebral arteries, basilar artery and bilateral posterior cerebral arteries are patent without proximal hemodynamically significant stenosis. Venous sinuses: As permitted by contrast timing, patent. Other: Right paraclinoid mass which measures approximately 2.2 x 1.9 cm, most likely a meningioma. Review of the MIP images confirms the above findings CT Brain Perfusion Findings: ASPECTS: 10. CBF (<30%) Volume: 67m Perfusion (Tmax>6.0s) volume: 0165mMismatch Volume: 65m22mnfarction Location:None identified IMPRESSION: 1. No emergent large vessel occlusion or proximal hemodynamically significant stenosis. 2. Right  paraclinoid mass which measures approximately 2.2 x 1.9 cm, most likely a meningioma. Recommend MRI with contrast for further evaluation. 3. Normal CT perfusion.  No evidence of penumbra or core infarct. Findings and recommendations discussed with Dr. KumDwyane Deed Dr. KirLeonel Ramsaya telephone at approximately 11:58 a.m. via telephone. Electronically Signed   By: FreMargaretha SheffieldD.   On: 03/01/2022 12:15   CT ANGIO NECK W OR WO CONTRAST  Result Date: 03/01/2022 CLINICAL DATA:  Neuro deficit, acute, stroke suspected; [id number]: CT ANGIOGRAPHY HEAD AND NECK CT PERFUSION BRAIN TECHNIQUE: Multidetector CT imaging of the head and neck was performed using the standard protocol during bolus administration of intravenous contrast. Multiplanar CT image reconstructions and MIPs were obtained to evaluate the vascular anatomy. Carotid stenosis measurements (when applicable) are obtained utilizing NASCET criteria, using the distal internal carotid diameter as the denominator. Multiphase CT imaging of the brain was performed following IV bolus contrast injection. Subsequent parametric perfusion maps were calculated using RAPID software. RADIATION DOSE REDUCTION: This exam was performed according to the departmental dose-optimization program which includes automated exposure control, adjustment of the mA and/or kV according to patient size and/or use of iterative reconstruction technique. CONTRAST:  1065m43mNIPAQUE IOHEXOL 350 MG/ML SOLN COMPARISON:  None Available. FINDINGS: CTA NECK FINDINGS Aortic arch: Great vessel origins are patent without significant stenosis. Right carotid system: No evidence of dissection, stenosis (50% or greater) or occlusion. Left carotid system: No evidence of dissection, stenosis (50% or greater) or occlusion. Vertebral arteries: Codominant. No evidence of dissection, stenosis (50% or greater) or occlusion. Skeleton: Other neck: Upper chest: Visualized lung apices are  clear. Review of the MIP images confirms the above findings CTA HEAD FINDINGS Anterior circulation: Bilateral intracranial ICAs, MCAs, and ACAs are patent without proximal hemodynamically significant stenosis. Posterior circulation: Bilateral intradural vertebral arteries, basilar artery and bilateral posterior cerebral arteries are patent without proximal hemodynamically significant stenosis. Venous sinuses: As permitted by contrast timing, patent. Other: Right paraclinoid mass which measures approximately 2.2 x 1.9 cm, most likely a meningioma. Review of the MIP images confirms the above findings CT Brain Perfusion Findings: ASPECTS: 10. CBF (<30%) Volume: 65mL 365mfusion (Tmax>6.0s) volume: 65mL M65match Volume: 65mL In23mction Location:None identified IMPRESSION: 1. No emergent large vessel occlusion or proximal hemodynamically significant stenosis. 2. Right paraclinoid mass which measures approximately 2.2 x 1.9 cm, most likely a meningioma. Recommend MRI with contrast for further evaluation. 3. Normal CT perfusion.  No evidence of penumbra or core infarct. Findings and recommendations discussed with Dr. Kumar aDwyane Dee. KirkpatLeonel Ramsaylephone at approximately 11:58 a.m. via telephone. Electronically Signed   By: FrederiJamesetta So  On: 03/01/2022 12:15   CT CEREBRAL PERFUSION W CONTRAST  Result Date: 03/01/2022 CLINICAL DATA:  Neuro deficit, acute, stroke suspected; [id number] Stroke [facility]) [id number] EXAM: CT ANGIOGRAPHY HEAD AND NECK CT PERFUSION BRAIN TECHNIQUE: Multidetector CT imaging of the head and neck was performed using the standard protocol during bolus administration of intravenous contrast. Multiplanar CT image reconstructions and MIPs were obtained to evaluate the vascular anatomy. Carotid stenosis measurements (when applicable) are obtained utilizing NASCET criteria, using the distal internal carotid diameter as the denominator. Multiphase CT imaging of the brain was performed following IV bolus  contrast injection. Subsequent parametric perfusion maps were calculated using RAPID software. RADIATION DOSE REDUCTION: This exam was performed according to the departmental dose-optimization program which includes automated exposure control, adjustment of the mA and/or kV according to patient size and/or use of iterative reconstruction technique. CONTRAST:  127m OMNIPAQUE IOHEXOL 350 MG/ML SOLN COMPARISON:  None Available. FINDINGS: CTA NECK FINDINGS Aortic arch: Great vessel origins are patent without significant stenosis. Right carotid system: No evidence of dissection, stenosis (50% or greater) or occlusion. Left carotid system: No evidence of dissection, stenosis (50% or greater) or occlusion. Vertebral arteries: Codominant. No evidence of dissection, stenosis (50% or greater) or occlusion. Skeleton: Other neck: Upper chest: Visualized lung apices are clear. Review of the MIP images confirms the above findings CTA HEAD FINDINGS Anterior circulation: Bilateral intracranial ICAs, MCAs, and ACAs are patent without proximal hemodynamically significant stenosis. Posterior circulation: Bilateral intradural vertebral arteries, basilar artery and bilateral posterior cerebral arteries are patent without proximal hemodynamically significant stenosis. Venous sinuses: As permitted by contrast timing, patent. Other: Right paraclinoid mass which measures approximately 2.2 x 1.9 cm, most likely a meningioma. Review of the MIP images confirms the above findings CT Brain Perfusion Findings: ASPECTS: 10. CBF (<30%) Volume: 017mPerfusion (Tmax>6.0s) volume: 26m68mismatch Volume: 26mL18mfarction Location:None identified IMPRESSION: 1. No emergent large vessel occlusion or proximal hemodynamically significant stenosis. 2. Right paraclinoid mass which measures approximately 2.2 x 1.9 cm, most likely a meningioma. Recommend MRI with contrast for further evaluation. 3. Normal CT perfusion.  No evidence of penumbra or core infarct.  Findings and recommendations discussed with Dr. KumaDwyane Dee Dr. KirkLeonel Ramsay telephone at approximately 11:58 a.m. via telephone. Electronically Signed   By: FredMargaretha Sheffield.   On: 03/01/2022 12:15   CT HEAD CODE STROKE WO CONTRAST  Addendum Date: 03/01/2022   ADDENDUM REPORT: 03/01/2022 12:12 ADDENDUM: Right paraclinoid mass, potentially a meningioma and better characterized on concurrent CTA. Recommend MRI with contrast to further characterize. This was discussed with Dr. KirkLeonel Ramsay Dr. KumaDwyane Deeectronically Signed   By: FredMargaretha Sheffield.   On: 03/01/2022 12:12   Result Date: 03/01/2022 CLINICAL DATA:  Code stroke.  Neuro deficit, acute, stroke suspected EXAM: CT HEAD WITHOUT CONTRAST TECHNIQUE: Contiguous axial images were obtained from the base of the skull through the vertex without intravenous contrast. RADIATION DOSE REDUCTION: This exam was performed according to the departmental dose-optimization program which includes automated exposure control, adjustment of the mA and/or kV according to patient size and/or use of iterative reconstruction technique. COMPARISON:  CT head 11/28/2021. FINDINGS: Brain: No evidence of acute infarction, hemorrhage, hydrocephalus, extra-axial collection or mass lesion/mass effect. Vascular: No hyperdense vessel. Skull: No acute fracture. Sinuses/Orbits: Clear. Other: No mastoid effusions ASPECTS ([facility] Early CT Score) total score (0-10 with 10 being normal): 10 IMPRESSION: 1. No evidence of acute abnormality. 2. ASPECTS is 10. 3. Chronic microvascular ischemic disease. Code stroke imaging  results were communicated on 03/01/2022 at 11:55 am to provider Dwyane Dee via telephone, who verbally acknowledged these results. Electronically Signed: By: Margaretha Sheffield M.D. On: 03/01/2022 12:00    Microbiology: Results for orders placed or performed during the hospital encounter of 07/07/21  Resp Panel by RT-PCR (Flu A&B, Covid) Nasopharyngeal Swab      Status: None   Collection Time: 07/08/21  1:47 AM   Specimen: Nasopharyngeal Swab; Nasopharyngeal(NP) swabs in vial transport medium  Result Value Ref Range Status   SARS Coronavirus 2 by RT PCR NEGATIVE NEGATIVE Final    Comment: (NOTE) SARS-CoV-2 target nucleic acids are NOT DETECTED.  The SARS-CoV-2 RNA is generally detectable in upper respiratory specimens during the acute phase of infection. The lowest concentration of SARS-CoV-2 viral copies this assay can detect is 138 copies/mL. A negative result does not preclude SARS-Cov-2 infection and should not be used as the sole basis for treatment or other patient management decisions. A negative result may occur with  improper specimen collection/handling, submission of specimen other than nasopharyngeal swab, presence of viral mutation(s) within the areas targeted by this assay, and inadequate number of viral copies(<138 copies/mL). A negative result must be combined with clinical observations, patient history, and epidemiological information. The expected result is Negative.  Fact Sheet for Patients:  EntrepreneurPulse.com.au  Fact Sheet for Healthcare Providers:  IncredibleEmployment.be  This test is no t yet approved or cleared by the [place] FDA and  has been authorized for detection and/or diagnosis of SARS-CoV-2 by FDA under an Emergency Use Authorization (EUA). This EUA will remain  in effect (meaning this test can be used) for the duration of the COVID-19 declaration under Section 564(b)(1) of the Act, 21 U.S.C.section 360bbb-3(b)(1), unless the authorization is terminated  or revoked sooner.       Influenza A by PCR NEGATIVE NEGATIVE Final   Influenza B by PCR NEGATIVE NEGATIVE Final    Comment: (NOTE) The Xpert Xpress SARS-CoV-2/FLU/RSV plus assay is intended as an aid in the diagnosis of influenza from Nasopharyngeal swab specimens and should not be used as a sole basis  for treatment. Nasal washings and aspirates are unacceptable for Xpert Xpress SARS-CoV-2/FLU/RSV testing.  Fact Sheet for Patients: EntrepreneurPulse.com.au  Fact Sheet for Healthcare Providers: IncredibleEmployment.be  This test is not yet approved or cleared by the [place] FDA and has been authorized for detection and/or diagnosis of SARS-CoV-2 by FDA under an Emergency Use Authorization (EUA). This EUA will remain in effect (meaning this test can be used) for the duration of the COVID-19 declaration under Section 564(b)(1) of the Act, 21 U.S.C. section 360bbb-3(b)(1), unless the authorization is terminated or revoked.  Performed at [facility], [place] [address], [place],  [address]     Labs: CBC: Recent Labs  Lab 03/17/22 0459  WBC 9.1  HGB 12.3  HCT 38.2  MCV 96.0  PLT 383   Basic Metabolic Panel: Recent Labs  Lab 03/17/22 0459  NA 138  K 4.0  CL 104  CO2 24  GLUCOSE 127*  BUN 17  CREATININE 0.71  CALCIUM 9.1   Liver Function Tests: No results for input(s): "AST", "ALT", "ALKPHOS", "BILITOT", "PROT", "ALBUMIN" in the last 168 hours. CBG: No results for input(s): "GLUCAP" in the last 168 hours.  Discharge time spent: greater than 30 minutes.  Signed: Oswald Hillock, MD [facility] 03/19/2022

## 2022-03-21 ENCOUNTER — Emergency Department (EMERGENCY_DEPARTMENT_HOSPITAL)
Admission: EM | Admit: 2022-03-21 | Discharge: 2022-03-21 | Disposition: A | Discharge status: Other Acute Inpatient Hospital | Payer: Medicaid Other | Source: Home / Self Care | Attending: Emergency Medicine | Admitting: Emergency Medicine

## 2022-03-21 ENCOUNTER — Encounter (HOSPITAL_COMMUNITY): Payer: Self-pay | Admitting: Nurse Practitioner

## 2022-03-21 ENCOUNTER — Inpatient Hospital Stay (HOSPITAL_COMMUNITY)
Admission: AD | Admit: 2022-03-21 | Discharge: 2022-03-30 | DRG: 885 | Disposition: A | Discharge status: Home / Self Care | Payer: Medicaid Other | Attending: Psychiatry | Admitting: Psychiatry

## 2022-03-21 ENCOUNTER — Encounter (HOSPITAL_COMMUNITY): Payer: Self-pay

## 2022-03-21 ENCOUNTER — Other Ambulatory Visit: Payer: Self-pay

## 2022-03-21 DIAGNOSIS — F4310 Post-traumatic stress disorder, unspecified: Secondary | ICD-10-CM | POA: Diagnosis present

## 2022-03-21 DIAGNOSIS — G4700 Insomnia, unspecified: Secondary | ICD-10-CM | POA: Diagnosis present

## 2022-03-21 DIAGNOSIS — D509 Iron deficiency anemia, unspecified: Secondary | ICD-10-CM | POA: Diagnosis present

## 2022-03-21 DIAGNOSIS — F1490 Cocaine use, unspecified, uncomplicated: Secondary | ICD-10-CM | POA: Diagnosis present

## 2022-03-21 DIAGNOSIS — F203 Undifferentiated schizophrenia: Principal | ICD-10-CM | POA: Diagnosis present

## 2022-03-21 DIAGNOSIS — R531 Weakness: Secondary | ICD-10-CM | POA: Insufficient documentation

## 2022-03-21 DIAGNOSIS — D329 Benign neoplasm of meninges, unspecified: Secondary | ICD-10-CM | POA: Diagnosis present

## 2022-03-21 DIAGNOSIS — Z91411 Personal history of adult psychological abuse: Secondary | ICD-10-CM

## 2022-03-21 DIAGNOSIS — K047 Periapical abscess without sinus: Secondary | ICD-10-CM | POA: Diagnosis not present

## 2022-03-21 DIAGNOSIS — Z7902 Long term (current) use of antithrombotics/antiplatelets: Secondary | ICD-10-CM

## 2022-03-21 DIAGNOSIS — I69354 Hemiplegia and hemiparesis following cerebral infarction affecting left non-dominant side: Secondary | ICD-10-CM | POA: Diagnosis not present

## 2022-03-21 DIAGNOSIS — Z91011 Allergy to milk products: Secondary | ICD-10-CM

## 2022-03-21 DIAGNOSIS — R45851 Suicidal ideations: Secondary | ICD-10-CM

## 2022-03-21 DIAGNOSIS — Z7984 Long term (current) use of oral hypoglycemic drugs: Secondary | ICD-10-CM | POA: Insufficient documentation

## 2022-03-21 DIAGNOSIS — F1290 Cannabis use, unspecified, uncomplicated: Secondary | ICD-10-CM | POA: Diagnosis present

## 2022-03-21 DIAGNOSIS — E119 Type 2 diabetes mellitus without complications: Secondary | ICD-10-CM | POA: Diagnosis present

## 2022-03-21 DIAGNOSIS — N760 Acute vaginitis: Secondary | ICD-10-CM | POA: Diagnosis not present

## 2022-03-21 DIAGNOSIS — Z87891 Personal history of nicotine dependence: Secondary | ICD-10-CM | POA: Diagnosis not present

## 2022-03-21 DIAGNOSIS — Z7982 Long term (current) use of aspirin: Secondary | ICD-10-CM | POA: Insufficient documentation

## 2022-03-21 DIAGNOSIS — F449 Dissociative and conversion disorder, unspecified: Secondary | ICD-10-CM | POA: Diagnosis present

## 2022-03-21 DIAGNOSIS — Z880 Allergy status to penicillin: Secondary | ICD-10-CM

## 2022-03-21 DIAGNOSIS — K219 Gastro-esophageal reflux disease without esophagitis: Secondary | ICD-10-CM | POA: Diagnosis present

## 2022-03-21 DIAGNOSIS — F329 Major depressive disorder, single episode, unspecified: Secondary | ICD-10-CM | POA: Diagnosis present

## 2022-03-21 DIAGNOSIS — F1510 Other stimulant abuse, uncomplicated: Secondary | ICD-10-CM | POA: Diagnosis present

## 2022-03-21 DIAGNOSIS — Z560 Unemployment, unspecified: Secondary | ICD-10-CM

## 2022-03-21 DIAGNOSIS — Z888 Allergy status to other drugs, medicaments and biological substances status: Secondary | ICD-10-CM

## 2022-03-21 DIAGNOSIS — Z1152 Encounter for screening for COVID-19: Secondary | ICD-10-CM | POA: Insufficient documentation

## 2022-03-21 DIAGNOSIS — Z79891 Long term (current) use of opiate analgesic: Secondary | ICD-10-CM

## 2022-03-21 DIAGNOSIS — Z5900 Homelessness unspecified: Secondary | ICD-10-CM

## 2022-03-21 DIAGNOSIS — Z79899 Other long term (current) drug therapy: Secondary | ICD-10-CM | POA: Insufficient documentation

## 2022-03-21 DIAGNOSIS — Z881 Allergy status to other antibiotic agents status: Secondary | ICD-10-CM

## 2022-03-21 DIAGNOSIS — R45850 Homicidal ideations: Secondary | ICD-10-CM | POA: Diagnosis present

## 2022-03-21 DIAGNOSIS — F4320 Adjustment disorder, unspecified: Secondary | ICD-10-CM | POA: Diagnosis present

## 2022-03-21 DIAGNOSIS — Z9151 Personal history of suicidal behavior: Secondary | ICD-10-CM

## 2022-03-21 DIAGNOSIS — E7800 Pure hypercholesterolemia, unspecified: Secondary | ICD-10-CM | POA: Diagnosis present

## 2022-03-21 DIAGNOSIS — F1590 Other stimulant use, unspecified, uncomplicated: Secondary | ICD-10-CM | POA: Diagnosis present

## 2022-03-21 LAB — COMPREHENSIVE METABOLIC PANEL
ALT: 75 U/L — ABNORMAL HIGH (ref 0–44)
AST: 36 U/L (ref 15–41)
Albumin: 4.4 g/dL (ref 3.5–5.0)
Alkaline Phosphatase: 103 U/L (ref 38–126)
Anion gap: 11 (ref 5–15)
BUN: 12 mg/dL (ref 6–20)
CO2: 22 mmol/L (ref 22–32)
Calcium: 9.7 mg/dL (ref 8.9–10.3)
Chloride: 102 mmol/L (ref 98–111)
Creatinine, Ser: 0.77 mg/dL (ref 0.44–1.00)
GFR, Estimated: >60 mL/min (ref >60)
Glucose, Bld: 121 mg/dL — ABNORMAL HIGH (ref 70–99)
Potassium: 4 mmol/L (ref 3.5–5.1)
Sodium: 135 mmol/L (ref 135–145)
Total Bilirubin: 0.2 mg/dL — ABNORMAL LOW (ref 0.3–1.2)
Total Protein: 8.7 g/dL — ABNORMAL HIGH (ref 6.5–8.1)

## 2022-03-21 LAB — CBC WITH DIFFERENTIAL/PLATELET
Abs Immature Granulocytes: 0.05 10*3/uL (ref 0.00–0.07)
Basophils Absolute: 0.1 10*3/uL (ref 0.0–0.1)
Basophils Relative: 1 %
Eosinophils Absolute: 0.3 10*3/uL (ref 0.0–0.5)
Eosinophils Relative: 2 %
HCT: 40.6 % (ref 36.0–46.0)
Hemoglobin: 14 g/dL (ref 12.0–15.0)
Immature Granulocytes: 0 %
Lymphocytes Relative: 16 %
Lymphs Abs: 2.2 10*3/uL (ref 0.7–4.0)
MCH: 32.2 pg (ref 26.0–34.0)
MCHC: 34.5 g/dL (ref 30.0–36.0)
MCV: 93.3 fL (ref 80.0–100.0)
Monocytes Absolute: 1.1 10*3/uL — ABNORMAL HIGH (ref 0.1–1.0)
Monocytes Relative: 9 %
Neutro Abs: 9.7 10*3/uL — ABNORMAL HIGH (ref 1.7–7.7)
Neutrophils Relative %: 72 %
Platelets: 462 10*3/uL — ABNORMAL HIGH (ref 150–400)
RBC: 4.35 MIL/uL (ref 3.87–5.11)
RDW: 15.8 % — ABNORMAL HIGH (ref 11.5–15.5)
WBC: 13.5 10*3/uL — ABNORMAL HIGH (ref 4.0–10.5)
nRBC: 0 % (ref 0.0–0.2)

## 2022-03-21 LAB — I-STAT BETA HCG BLOOD, ED (MC, WL, AP ONLY): I-stat hCG, quantitative: <5 m[IU]/mL (ref <5)

## 2022-03-21 LAB — ETHANOL: Alcohol, Ethyl (B): <10 mg/dL (ref <10)

## 2022-03-21 LAB — RESP PANEL BY RT-PCR (RSV, FLU A&B, COVID)  RVPGX2
Influenza A by PCR: NEGATIVE
Influenza B by PCR: NEGATIVE
Resp Syncytial Virus by PCR: NEGATIVE
SARS Coronavirus 2 by RT PCR: NEGATIVE

## 2022-03-21 LAB — ACETAMINOPHEN LEVEL: Acetaminophen (Tylenol), Serum: <10 ug/mL — ABNORMAL LOW (ref 10–30)

## 2022-03-21 LAB — SALICYLATE LEVEL: Salicylate Lvl: <7 mg/dL — ABNORMAL LOW (ref 7.0–30.0)

## 2022-03-21 MED ORDER — GABAPENTIN 300 MG PO CAPS
300.0000 mg | ORAL_CAPSULE | Freq: Every day | ORAL | Status: DC
  Administered 2022-03-21: 300 mg via ORAL
  Filled 2022-03-21: qty 1

## 2022-03-21 MED ORDER — BENZTROPINE MESYLATE 0.5 MG PO TABS: 0.5000 mg | ORAL_TABLET | Freq: Two times a day (BID) | ORAL | Status: DC | PRN

## 2022-03-21 MED ORDER — ATORVASTATIN CALCIUM 40 MG PO TABS
40.0000 mg | ORAL_TABLET | Freq: Every day | ORAL | Status: DC
  Administered 2022-03-22 – 2022-03-30 (×9): 40 mg via ORAL
  Filled 2022-03-21 (×12): qty 1

## 2022-03-21 MED ORDER — TRAZODONE HCL 50 MG PO TABS: 150.0000 mg | ORAL_TABLET | Freq: Every day | ORAL | Status: DC

## 2022-03-21 MED ORDER — CLOPIDOGREL BISULFATE 75 MG PO TABS
75.0000 mg | ORAL_TABLET | Freq: Every day | ORAL | Status: DC
  Administered 2022-03-22 – 2022-03-30 (×9): 75 mg via ORAL
  Filled 2022-03-21 (×12): qty 1

## 2022-03-21 MED ORDER — METFORMIN HCL 500 MG PO TABS
500.0000 mg | ORAL_TABLET | Freq: Every day | ORAL | Status: DC
  Administered 2022-03-22 – 2022-03-30 (×9): 500 mg via ORAL
  Filled 2022-03-21 (×12): qty 1

## 2022-03-21 MED ORDER — METFORMIN HCL 500 MG PO TABS: 500.0000 mg | ORAL_TABLET | Freq: Every day | ORAL | Status: DC

## 2022-03-21 MED ORDER — ASPIRIN 81 MG PO TBEC
81.0000 mg | DELAYED_RELEASE_TABLET | Freq: Every day | ORAL | Status: DC
  Administered 2022-03-22 – 2022-03-30 (×9): 81 mg via ORAL
  Filled 2022-03-21 (×13): qty 1

## 2022-03-21 MED ORDER — ATORVASTATIN CALCIUM 40 MG PO TABS
40.0000 mg | ORAL_TABLET | Freq: Every day | ORAL | Status: DC
  Administered 2022-03-21: 40 mg via ORAL
  Filled 2022-03-21: qty 1

## 2022-03-21 MED ORDER — ALUM & MAG HYDROXIDE-SIMETH 200-200-20 MG/5ML PO SUSP: 30.0000 mL | ORAL | Status: DC | PRN

## 2022-03-21 MED ORDER — ASPIRIN 81 MG PO TBEC
81.0000 mg | DELAYED_RELEASE_TABLET | Freq: Every day | ORAL | Status: DC
  Administered 2022-03-21: 81 mg via ORAL
  Filled 2022-03-21: qty 1

## 2022-03-21 MED ORDER — TRAZODONE HCL 150 MG PO TABS
150.0000 mg | ORAL_TABLET | Freq: Every day | ORAL | Status: DC
  Administered 2022-03-21 – 2022-03-29 (×9): 150 mg via ORAL
  Filled 2022-03-21 (×16): qty 1

## 2022-03-21 MED ORDER — QUETIAPINE FUMARATE 100 MG PO TABS
100.0000 mg | ORAL_TABLET | Freq: Every day | ORAL | Status: DC
  Administered 2022-03-24 – 2022-03-29 (×6): 100 mg via ORAL
  Filled 2022-03-21 (×13): qty 1

## 2022-03-21 MED ORDER — ACETAMINOPHEN 325 MG PO TABS
650.0000 mg | ORAL_TABLET | Freq: Four times a day (QID) | ORAL | Status: DC | PRN
  Administered 2022-03-22 – 2022-03-29 (×10): 650 mg via ORAL
  Filled 2022-03-21 (×10): qty 2

## 2022-03-21 MED ORDER — FERROUS SULFATE 325 (65 FE) MG PO TABS
325.0000 mg | ORAL_TABLET | Freq: Every day | ORAL | Status: DC
  Administered 2022-03-22 – 2022-03-30 (×9): 325 mg via ORAL
  Filled 2022-03-21 (×12): qty 1

## 2022-03-21 MED ORDER — CLOPIDOGREL BISULFATE 75 MG PO TABS
75.0000 mg | ORAL_TABLET | Freq: Every day | ORAL | Status: DC
  Administered 2022-03-21: 75 mg via ORAL
  Filled 2022-03-21: qty 1

## 2022-03-21 MED ORDER — PANTOPRAZOLE SODIUM 40 MG PO TBEC
40.0000 mg | DELAYED_RELEASE_TABLET | Freq: Every day | ORAL | Status: DC
  Administered 2022-03-21: 40 mg via ORAL
  Filled 2022-03-21: qty 1

## 2022-03-21 MED ORDER — QUETIAPINE FUMARATE 100 MG PO TABS: 100.0000 mg | ORAL_TABLET | Freq: Every day | ORAL | Status: DC

## 2022-03-21 MED ORDER — PANTOPRAZOLE SODIUM 40 MG PO TBEC
40.0000 mg | DELAYED_RELEASE_TABLET | Freq: Every day | ORAL | Status: DC
  Administered 2022-03-22 – 2022-03-30 (×9): 40 mg via ORAL
  Filled 2022-03-21 (×12): qty 1

## 2022-03-21 MED ORDER — ACETAMINOPHEN 325 MG PO TABS
650.0000 mg | ORAL_TABLET | Freq: Once | ORAL | Status: AC
  Administered 2022-03-21: 650 mg via ORAL
  Filled 2022-03-21: qty 2

## 2022-03-21 MED ORDER — GABAPENTIN 300 MG PO CAPS
300.0000 mg | ORAL_CAPSULE | Freq: Every day | ORAL | Status: DC
  Administered 2022-03-22 – 2022-03-30 (×9): 300 mg via ORAL
  Filled 2022-03-21 (×12): qty 1

## 2022-03-21 MED ORDER — FERROUS SULFATE 325 (65 FE) MG PO TABS: 325.0000 mg | ORAL_TABLET | Freq: Every day | ORAL | Status: DC
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
## 2022-03-21 NOTE — Progress Notes (Signed)
Adult Psychoeducational Group Note  Date:  03/21/2022 Time:  11:19 PM  Group Topic/Focus:  Wrap-Up Group:   The focus of this group is to help patients review their daily goal of treatment and discuss progress on daily workbooks.  Participation Level:  Did Not Attend  Participation Quality:   n/a  Affect:   n/a  Cognitive:   n/a  Insight: None  Engagement in Group:  none  Modes of Intervention:   n/a  Additional Comments:   Pt did not attend the Wrap Up group.  Wetzel Bjornstad Carrine Kroboth 03/21/2022, 11:19 PM

## 2022-03-21 NOTE — Progress Notes (Signed)
Per Lynnda Shields, [facility], pt has been accepted to [facility] bed 407-1. Accepting provider is Lynelle Doctor, Attending provider is Dr. Caswell Corwin. Number for report is [phone].   Glennie Isle, MSW, LCSW-A Phone: [phone] Disposition/TOC

## 2022-03-21 NOTE — ED Notes (Signed)
Provided pt beverage and crackers. Warm blanket given
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
## 2022-03-21 NOTE — Progress Notes (Signed)
Per Vesta Mixer, NP, patient meets criteria for inpatient treatment. There are currently no available beds at [facility] today. CSW faxed referrals to the following facilities for review:  [address] [place] [address] [phone] --  [place] N/A [address] [place] [address] [phone] --  [address] [place] [address] [phone] --  [address] [place] [address] [phone] --  [place]  Pending - Request Sent N/A [place], [place] [address] [phone] --  [place] [address] [place], [place] [address] [phone] --  [place] [address] [place] [address] [phone] --  [facility]  Pending - Request Sent N/A [address] [place] [address] [phone] --  [facility]  Pending - Request Sent N/A [address] [place] [address] [phone] --  [facility]  Pending - Request Sent N/A [place] [address] [place] [address] [phone] --  [facility]  Pending - Request Sent N/A [address] [place] [address] [phone] --  [place] N/A [address], [place] [address] [phone] --  [facility]  Pending - Request Sent N/A [place], [place] [address] [phone] --  [place] Sent N/A [address] [place] [address] [phone] --  [place] N/A [address] [place] [address] [phone] --  [place]. [address] [place] [address] [phone] --  [place] N/A [address] [place] [address] [phone] --   TTS will continue to seek bed placement.  Glennie [place], MSW, [facility] [place] Phone: [phone] Disposition/TOC
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
## 2022-03-21 NOTE — Tx Team (Signed)
Initial Treatment Plan 03/21/2022 7:23 PM Sirenity Shew [id number]    PATIENT STRESSORS: Loss of sons in death   Substance abuse     PATIENT STRENGTHS: Communication skills    PATIENT IDENTIFIED PROBLEMS: Depression  Anxiety  Hopelessness   Suicidal  Crying spell             DISCHARGE CRITERIA:  Withdrawal symptoms are absent or subacute and managed without 24-hour nursing intervention  PRELIMINARY DISCHARGE PLAN: Outpatient therapy  PATIENT/FAMILY INVOLVEMENT: This treatment plan has been presented to and reviewed with the patient, Joanne Gonzalez, The patient has been given the opportunity to ask questions and make suggestions.  Dorris Carnes, RN 03/21/2022, 7:23 PM
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
## 2022-03-22 ENCOUNTER — Encounter (HOSPITAL_COMMUNITY): Payer: Self-pay

## 2022-03-22 DIAGNOSIS — F203 Undifferentiated schizophrenia: Secondary | ICD-10-CM | POA: Diagnosis not present
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
## 2022-03-22 NOTE — [facility] Group Notes (Signed)
Patient did not attend the AA group. 

## 2022-03-22 NOTE — Group Note (Signed)
Recreation Therapy Group Note   Group Topic:Problem Solving  Group Date: 03/22/2022 Start Time: 0930 End Time: 1000 Facilitators: Jaydalee Bardwell-McCall, LRT,CTRS Location: [address] Dayroom   Goal Area(s) Addresses:  Patient will effectively work in a team with other group members. Patient will verbalize importance of using appropriate problem solving techniques.  Patient will identify positive change associated with effective problem solving skills.    Group Description: Christmas Trivia.  Patients were divided into two teams.  LRT conducted three rounds of trivia with the patients.  The value of each question, was written beside each line were the answer was to be written.  After each round, patients will count of their points and write them at the bottom of the sheet.  At the end of the game, patients will add the totals of all three rounds together.  The team with the most points, wins the game.     Affect/Mood: N/A   Participation Level: Did not attend    Clinical Observations/Individualized Feedback:     Plan: Continue to engage patient in RT group sessions 2-3x/week.   Fuquan Wilson-McCall, LRT,CTRS 03/22/2022 12:40 PM

## 2022-03-22 NOTE — BH IP Treatment Plan (Signed)
Interdisciplinary Treatment and Diagnostic Plan Update  03/22/2022 Time of Session: 9:35 AM  Joanne Gonzalez MRN: [id number]  Principal Diagnosis: Undifferentiated schizophrenia [facility])  Secondary Diagnoses: Principal Problem:   Undifferentiated schizophrenia ([place]) Active Problems:   Suicidal ideation   Current Medications:  Current Facility-Administered Medications  Medication Dose Route Frequency Provider Last Rate Last Admin   acetaminophen (TYLENOL) tablet 650 mg  650 mg Oral Q6H PRN Vesta Mixer, NP   650 mg at 03/22/22 0822   alum & mag hydroxide-simeth (MAALOX/MYLANTA) 200-200-20 MG/5ML suspension 30 mL  30 mL Oral Q4H PRN Vesta Mixer, NP       aspirin EC tablet 81 mg  81 mg Oral Daily Vesta Mixer, NP   81 mg at 03/22/22 0819   atorvastatin (LIPITOR) tablet 40 mg  40 mg Oral Daily Vesta Mixer, NP   40 mg at 03/22/22 0819   benztropine (COGENTIN) tablet 0.5 mg  0.5 mg Oral BID PRN Vesta Mixer, NP       clopidogrel (PLAVIX) tablet 75 mg  75 mg Oral Daily Vesta Mixer, NP   75 mg at 03/22/22 2542   ferrous sulfate tablet 325 mg  325 mg Oral Q breakfast Vesta Mixer, NP   325 mg at 03/22/22 0819   gabapentin (NEURONTIN) capsule 300 mg  300 mg Oral Daily Vesta Mixer, NP   300 mg at 03/22/22 7062   metFORMIN (GLUCOPHAGE) tablet 500 mg  500 mg Oral Q breakfast Vesta Mixer, NP   500 mg at 03/22/22 0819   pantoprazole (PROTONIX) EC tablet 40 mg  40 mg Oral Daily Vesta Mixer, NP   40 mg at 03/22/22 3762   QUEtiapine (SEROQUEL) tablet 100 mg  100 mg Oral QHS Vesta Mixer, NP       traZODone (DESYREL) tablet 150 mg  150 mg Oral QHS Vesta Mixer, NP   150 mg at 03/21/22 2027   PTA Medications: Medications Prior to Admission  Medication Sig Dispense Refill Last Dose   Accu-Chek Softclix Lancets lancets USE TO CHECK BLOOD SUGAR DAILY (Patient taking differently: 1 each by Other route See admin instructions. USE TO CHECK BLOOD SUGAR DAILY)  100 each 0    aspirin EC 81 MG tablet Take 1 tablet (81 mg total) by mouth daily. Swallow whole. 30 tablet 12    atorvastatin (LIPITOR) 40 MG tablet Take 1 tablet (40 mg total) by mouth daily. 30 tablet 0    benzocaine (ORAJEL) 10 % mucosal gel Use as directed in the mouth or throat 4 (four) times daily as needed for mouth pain. (Patient taking differently: Use as directed 1 Application in the mouth or throat 4 (four) times daily as needed for mouth pain.) 5.3 g 0    benztropine (COGENTIN) 0.5 MG tablet Take 1 tablet (0.5 mg total) by mouth 2 (two) times daily as needed for tremors (EPS). 14 tablet 0    Blood Glucose Monitoring Suppl (ACCU-CHEK GUIDE) w/Device KIT Use to check blood sugar once daily. R73.03 (Patient taking differently: 1 each by Other route See admin instructions. Use to check blood sugar once daily. R73.03) 1 kit 0    clopidogrel (PLAVIX) 75 MG tablet Take 1 tablet (75 mg total) by mouth daily. Call neurology office for more refills. 14 tablet 1    ferrous sulfate 325 (65 FE) MG EC tablet Take 325 mg by mouth daily with breakfast.      gabapentin (NEURONTIN) 300 MG capsule Take 300 mg by mouth daily.  glucose blood (ACCU-CHEK GUIDE) test strip USE TO CHECK BLOOD SUGAR ONCE DAILY (Patient taking differently: 1 each by Other route See admin instructions. USE TO CHECK BLOOD SUGAR ONCE DAILY) 100 strip 0    metFORMIN (GLUCOPHAGE) 500 MG tablet TAKE ONE TABLET BY MOUTH DAILY WITH BREAKFAST (Patient taking differently: Take 500 mg by mouth daily with breakfast.) 30 tablet 0    mirtazapine (REMERON) 30 MG tablet Take 1 tablet (30 mg total) by mouth at bedtime. 30 tablet 0    pantoprazole (PROTONIX) 40 MG tablet Take 1 tablet (40 mg total) by mouth daily. 30 tablet 0    QUEtiapine (SEROQUEL) 100 MG tablet Take 100 mg by mouth at bedtime.      traZODone (DESYREL) 150 MG tablet Take 1 tablet (150 mg total) by mouth at bedtime as needed for sleep. (Patient taking differently: Take 150 mg by  mouth at bedtime.) 30 tablet 1    Vitamin D, Ergocalciferol, (DRISDOL) 1.25 MG (50000 UNIT) CAPS capsule Take 1 capsule (50,000 Units total) by mouth every 7 (seven) days. (Patient not taking: Reported on 03/02/2022) 4 capsule 2     Patient Stressors: Loss of sons in death   Substance abuse    Patient Strengths: Communication skills   Treatment Modalities: Medication Management, Group therapy, Case management,  1 to 1 session with clinician, Psychoeducation, Recreational therapy.   Physician Treatment Plan for Primary Diagnosis: Undifferentiated schizophrenia ([place]) Long Term Goal(s):     Short Term Goals:    Medication Management: Evaluate patient's response, side effects, and tolerance of medication regimen.  Therapeutic Interventions: 1 to 1 sessions, Unit Group sessions and Medication administration.  Evaluation of Outcomes: Not Progressing  Physician Treatment Plan for Secondary Diagnosis: Principal Problem:   Undifferentiated schizophrenia ([place]) Active Problems:   Suicidal ideation  Long Term Goal(s):     Short Term Goals:       Medication Management: Evaluate patient's response, side effects, and tolerance of medication regimen.  Therapeutic Interventions: 1 to 1 sessions, Unit Group sessions and Medication administration.  Evaluation of Outcomes: Not Progressing   RN Treatment Plan for Primary Diagnosis: Undifferentiated schizophrenia ([place]) Long Term Goal(s): Knowledge of disease and therapeutic regimen to maintain health will improve  Short Term Goals: Ability to remain free from injury will improve, Ability to verbalize frustration and anger appropriately will improve, Ability to demonstrate self-control, Ability to participate in decision making will improve, Ability to verbalize feelings will improve, Ability to disclose and discuss suicidal ideas, Ability to identify and develop effective coping behaviors will improve, and Compliance with prescribed medications  will improve  Medication Management: RN will administer medications as ordered by provider, will assess and evaluate patient's response and provide education to patient for prescribed medication. RN will report any adverse and/or side effects to prescribing provider.  Therapeutic Interventions: 1 on 1 counseling sessions, Psychoeducation, Medication administration, Evaluate responses to treatment, Monitor vital signs and CBGs as ordered, Perform/monitor CIWA, COWS, AIMS and Fall Risk screenings as ordered, Perform wound care treatments as ordered.  Evaluation of Outcomes: Not Progressing   LCSW Treatment Plan for Primary Diagnosis: Undifferentiated schizophrenia ([place]) Long Term Goal(s): Safe transition to appropriate next level of care at discharge, Engage patient in therapeutic group addressing interpersonal concerns.  Short Term Goals: Engage patient in aftercare planning with referrals and resources, Increase social support, Increase ability to appropriately verbalize feelings, Increase emotional regulation, Facilitate acceptance of mental health diagnosis and concerns, Facilitate patient progression through stages of change regarding substance  use diagnoses and concerns, Identify triggers associated with mental health/substance abuse issues, and Increase skills for wellness and recovery  Therapeutic Interventions: Assess for all discharge needs, 1 to 1 time with Social worker, Explore available resources and support systems, Assess for adequacy in community support network, Educate family and significant other(s) on suicide prevention, Complete Psychosocial Assessment, Interpersonal group therapy.  Evaluation of Outcomes: Not Progressing   Progress in Treatment: Attending groups: No Participating in groups: No. Taking medication as prescribed: Yes. Toleration medication: Yes. Family/Significant other contact made: No, will contact:  Patient needs to access her phone to find number   Patient understands diagnosis: No. Discussing patient identified problems/goals with staff: Yes. Medical problems stabilized or resolved: Yes. Denies suicidal/homicidal ideation: No." I am unsure says patient about being suicidal  Issues/concerns per patient self-inventory: No.    New problem(s) identified: No, Describe:  None reported   New Short Term/Long Term Goal(s):medication stabilization, elimination of SI thoughts, development of comprehensive mental wellness plan.    Patient Goals:  "Not sure, don't wanna feel like this, I am confused, stressed, and my son recently died. I am not afraid to kill myself anymore"  Discharge Plan or Barriers: Patient recently admitted. CSW will continue to follow and assess for appropriate referrals and possible discharge planning.    Reason for Continuation of Hospitalization: Anxiety Depression Medical Issues Medication stabilization Suicidal ideation  Estimated Length of Stay:5-7 days   Last 3 [place] Suicide Severity Risk Score: Flowsheet Row Admission (Current) from 03/21/2022 in [place] [facility] Most recent reading at 03/21/2022 10:22 PM ED from 03/21/2022 in [place] Most recent reading at 03/21/2022  8:47 AM ED to Hosp-Admission (Discharged) from 03/01/2022 in [place] Most recent reading at 03/01/2022 11:25 AM  C-SSRS RISK CATEGORY High Risk High Risk No Risk       Last PHQ 2/9 Scores:    07/06/2021    1:45 PM 02/25/2021    9:02 AM 11/10/2020    1:24 PM  Depression screen PHQ 2/9  Decreased Interest _0 Down, Depressed, Hopeless _1 PHQ - 2 Score _2 Altered sleeping _3 Tired, decreased energy _4 Change in appetite _5 Feeling bad or failure about yourself  2 0 0  Trouble concentrating 1 0 0  Moving slowly or fidgety/restless 2 0 0  Suicidal thoughts 0 0 0  PHQ-9 Score _6 Scribe  for Treatment Team: Sherre Lain, Latanya Presser 03/22/2022 11:29 AM

## 2022-03-22 NOTE — Progress Notes (Addendum)
D. Pt presents with a sad affect, depressed, anxious mood- has been appropriate during interactions. Pt continues to endorse SI, but reports that she feels safe being here, and agrees to contact staff before acting on any harmful thoughts. Pt denies A/VH, and does not appear to be responding to internal stimuli. Pt did not attend groups, but did go to the dining room for lunch. A. Labs and vitals monitored. Pt given and educated on medications. Pt given tylenol earlier in the shift for left hip pain rated 8/10. Pt supported emotionally and encouraged to express concerns and ask questions.   R. Pt remains safe with 15 minute checks. Will continue POC.

## 2022-03-22 NOTE — BHH Group Notes (Signed)
Patient did not attend goals group.
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
## 2022-03-23 DIAGNOSIS — F203 Undifferentiated schizophrenia: Secondary | ICD-10-CM | POA: Diagnosis not present

## 2022-03-23 LAB — TSH: TSH: 1.9 u[IU]/mL (ref 0.350–4.500)

## 2022-03-23 MED ORDER — WHITE PETROLATUM EX OINT
TOPICAL_OINTMENT | CUTANEOUS | Status: AC
  Filled 2022-03-23: qty 5

## 2022-03-23 NOTE — Group Note (Signed)
Recreation Therapy Group Note   Group Topic:Animal Assisted Therapy   Group Date: 03/23/2022 Start Time: 1430 End Time: 1505 Facilitators: Kasey Hansell-McCall, LRT,CTRS Location: [address] Dayroom   Animal-Assisted Activity (AAA) Program Checklist/Progress Notes Patient Eligibility Criteria Checklist & Daily Group note for Rec Tx Intervention  AAA/T Program Assumption of Risk Form signed by Patient/ or Parent Legal Guardian Yes   Patient understands his/her participation is voluntary Yes   Affect/Mood: N/A   Participation Level: Did not attend    Clinical Observations/Individualized Feedback:     Plan: Continue to engage patient in RT group sessions 2-3x/week.   Joanne Gonzalez, LRT,CTRS 03/23/2022 3:52 PM

## 2022-03-23 NOTE — Group Note (Unsigned)
Date:  03/23/2022 Time:  2:10 PM  Group Topic/Focus:  Healthy Communication:   The focus of this group is to discuss communication, barriers to communication, as well as healthy ways to communicate with others.     Participation Level:  {BHH PARTICIPATION HKGOV:70340}  Participation Quality:  {BHH PARTICIPATION QUALITY:22265}  Affect:  {BHH AFFECT:22266}  Cognitive:  {BHH COGNITIVE:22267}  Insight: {BHH Insight2:20797}  Engagement in Group:  {BHH ENGAGEMENT IN BTCYE:18590}  Modes of Intervention:  {BHH MODES OF INTERVENTION:22269}  Additional Comments:  ***  Camila Li 03/23/2022, 2:10 PM
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
## 2022-03-23 NOTE — Plan of Care (Signed)
  Problem: Education: Goal: Knowledge of disease or condition will improve Outcome: Progressing Goal: Knowledge of secondary prevention will improve (MUST DOCUMENT ALL) Outcome: Progressing Goal: Knowledge of patient specific risk factors will improve Joanne Gonzalez N/A or DELETE if not current risk factor) Outcome: Progressing   Problem: Ischemic Stroke/TIA Tissue Perfusion: Goal: Complications of ischemic stroke/TIA will be minimized Outcome: Progressing   Problem: Coping: Goal: Will verbalize positive feelings about self Outcome: Progressing Goal: Will identify appropriate support needs Outcome: Progressing

## 2022-03-23 NOTE — Group Note (Unsigned)
Date:  03/23/2022 Time:  10:11 AM  Group Topic/Focus:  Goals Group:   The focus of this group is to help patients establish daily goals to achieve during treatment and discuss how the patient can incorporate goal setting into their daily lives to aide in recovery.     Participation Level:  {B[facility] PARTICIPATION VDFPB:92178}  Participation Quality:  {B[facility] PARTICIPATION QUALITY:22265}  Affect:  {BHH AFFECT:22266}  Cognitive:  {B[facility] COGNITIVE:22267}  Insight: {BHH Insight2:20797}  Engagement in Group:  {BHH ENGAGEMENT IN NJNGW:37023}  Modes of Intervention:  {BHH MODES OF INTERVENTION:22269}  Additional Comments:  ***  Joanne Gonzalez 03/23/2022, 10:11 AM

## 2022-03-23 NOTE — B[facility] Group Notes (Signed)
Adult Psychoeducational Group Note  Date:  03/23/2022 Time:  4:24 PM  Group Topic/Focus:  Healthy Communication:   The focus of this group is to discuss communication, barriers to communication, as well as healthy ways to communicate with others.  Participation Level:  Active  Participation Quality:  Appropriate  Affect:  Appropriate  Cognitive:  Appropriate  Insight: Appropriate  Engagement in Group:  Engaged  Modes of Intervention:  Education  Additional Comments:  PT participated in group " Be impeccable with your words.  Camila Li 03/23/2022, 4:24 PM

## 2022-03-23 NOTE — Progress Notes (Signed)
Pt rates depression 0/10 and anxiety 0/10. Pt reports a Fair  appetite, and No  physical problems. Pt denies SI/HI/AVH and verbally contracts for safety. PRN tylenol given to assist with chronic left hip pain. Provided support and encouragement. Pt safe on the unit. Q 15 minute safety checks continued.

## 2022-03-23 NOTE — Group Note (Signed)
LCSW Group Therapy Note   Group Date: 03/23/2022 Start Time: 1100 End Time: 1200   Type of Therapy: Group Therapy: Boundaries  Participation Level: Did Not Attend  Description of Group: This group will address the use of boundaries in their personal lives. Patients will explore why boundaries are important, the difference between healthy and unhealthy boundaries, and negative and positive outcomes of different boundaries and will look at how boundaries can be crossed.  Patients will be encouraged to identify current boundaries in their own lives and identify what kind of boundary is being set. Facilitators will guide patients in utilizing problem-solving interventions to address and correct types boundaries being used and to address when no boundary is being used. Understanding and applying boundaries will be explored and addressed for obtaining and maintaining a balanced life. Patients will be encouraged to explore ways to assertively make their boundaries and needs known to significant others in their lives, using other group members and facilitator for role play, support, and feedback.   Therapeutic Goals: 1. Patient will identify areas in their life where setting clear boundaries could be used to improve their life.  2. Patient will identify signs/triggers that a boundary is not being respected. 3. Patient will identify two ways to set boundaries in order to achieve balance in their lives: 4. Patient will demonstrate ability to communicate their needs and set boundaries through discussion and/or role plays   Summary of Progress/Problems: Did not attend   Darleen Crocker, LCSWA 03/23/2022  1:20 PM

## 2022-03-24 DIAGNOSIS — F203 Undifferentiated schizophrenia: Secondary | ICD-10-CM | POA: Diagnosis not present

## 2022-03-24 LAB — COMPREHENSIVE METABOLIC PANEL
ALT: 40 U/L (ref 0–44)
AST: 24 U/L (ref 15–41)
Albumin: 3.6 g/dL (ref 3.5–5.0)
Alkaline Phosphatase: 102 U/L (ref 38–126)
Anion gap: 9 (ref 5–15)
BUN: 12 mg/dL (ref 6–20)
CO2: 21 mmol/L — ABNORMAL LOW (ref 22–32)
Calcium: 9.1 mg/dL (ref 8.9–10.3)
Chloride: 108 mmol/L (ref 98–111)
Creatinine, Ser: 0.76 mg/dL (ref 0.44–1.00)
GFR, Estimated: >60 mL/min (ref >60)
Glucose, Bld: 114 mg/dL — ABNORMAL HIGH (ref 70–99)
Potassium: 3.8 mmol/L (ref 3.5–5.1)
Sodium: 138 mmol/L (ref 135–145)
Total Bilirubin: 0.3 mg/dL (ref 0.3–1.2)
Total Protein: 7.4 g/dL (ref 6.5–8.1)

## 2022-03-24 MED ORDER — DULOXETINE HCL 30 MG PO CPEP
30.0000 mg | ORAL_CAPSULE | Freq: Every day | ORAL | Status: DC
  Administered 2022-03-24 – 2022-03-27 (×4): 30 mg via ORAL
  Filled 2022-03-24 (×7): qty 1

## 2022-03-24 NOTE — Group Note (Unsigned)
Date:  03/24/2022 Time:  10:06 AM  Group Topic/Focus:  Goals Group:   The focus of this group is to help patients establish daily goals to achieve during treatment and discuss how the patient can incorporate goal setting into their daily lives to aide in recovery. Orientation:   The focus of this group is to educate the patient on the purpose and policies of crisis stabilization and provide a format to answer questions about their admission.  The group details unit policies and expectations of patients while admitted.     Participation Level:  {BHH PARTICIPATION ZCHYI:50277}  Participation Quality:  {BHH PARTICIPATION QUALITY:22265}  Affect:  {B[facility] AFFECT:22266}  Cognitive:  {BHH COGNITIVE:22267}  Insight: {BHH Insight2:20797}  Engagement in Group:  {BHH ENGAGEMENT IN AJOIN:86767}  Modes of Intervention:  {B[facility] MODES OF INTERVENTION:22269}  Additional Comments:  ***  Joanne Gonzalez 03/24/2022, 10:06 AM

## 2022-03-24 NOTE — Group Note (Signed)
Date:  03/24/2022 Time:  3:06 PM  Group Topic/Focus:  Self Care:   The focus of this group is to help patients understand the importance of self-care in order to improve or restore emotional, physical, spiritual, interpersonal, and financial health.    Participation Level:  Active  Participation Quality:  Appropriate  Affect:  Appropriate  Cognitive:  Appropriate  Insight: Appropriate  Engagement in Group:  Engaged  Modes of Intervention:  Discussion  Additional Comments:  Pt worked on Self-encouragement handouts  Garvin Fila 03/24/2022, 3:06 PM
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
## 2022-03-24 NOTE — BHH Group Notes (Signed)
[place] Group Notes:  (Nursing/MHT/Case Management/Adjunct)  Date:  03/24/2022  Time:  12:41 AM  Type of Therapy:   wrap up group   Participation Level:  Minimal  Participation Quality:  Appropriate  Affect:  Appropriate  Cognitive:  Appropriate  Insight:  Appropriate  Engagement in Group:  Engaged  Modes of Intervention:  Discussion  Summary of Progress/Problems: Pt shared with the group how today was ok.  She continues to have a lot of depression.    Blenda Mounts Moffat 03/24/2022, 12:41 AM

## 2022-03-24 NOTE — Progress Notes (Signed)
   03/24/22 0800  Psych Admission Type (Psych Patients Only)  Admission Status Voluntary  Psychosocial Assessment  Patient Complaints Anxiety;Depression  Eye Contact Poor  Facial Expression Flat  Affect Flat  Speech Logical/coherent  Interaction Assertive  Motor Activity Slow  Appearance/Hygiene Unremarkable  Behavior Characteristics Cooperative  Mood Depressed;Anxious  Thought Process  Coherency WDL  Content WDL  Delusions WDL  Perception WDL  Hallucination None reported or observed  Judgment WDL  Confusion WDL  Danger to Self  Current suicidal ideation? Active  Agreement Not to Harm Self Yes  Description of Agreement verbal  Danger to Others  Danger to Others None reported or observed

## 2022-03-24 NOTE — Group Note (Unsigned)
Date:  03/24/2022 Time:  2:47 PM  Group Topic/Focus:  Self Care:   The focus of this group is to help patients understand the importance of self-care in order to improve or restore emotional, physical, spiritual, interpersonal, and financial health.     Participation Level:  {BHH PARTICIPATION TFTDD:22025}  Participation Quality:  {BHH PARTICIPATION QUALITY:22265}  Affect:  {BHH AFFECT:22266}  Cognitive:  {BHH COGNITIVE:22267}  Insight: {BHH Insight2:20797}  Engagement in Group:  {BHH ENGAGEMENT IN KYHCW:23762}  Modes of Intervention:  {BHH MODES OF INTERVENTION:22269}  Additional Comments:  ***  Garvin Fila 03/24/2022, 2:47 PM

## 2022-03-24 NOTE — Group Note (Signed)
Recreation Therapy Group Note   Group Topic:Team Building  Group Date: 03/24/2022 Start Time: 0930 End Time: 0950 Facilitators: Tallyn Holroyd-McCall, LRT,CTRS Location: [address] Day[address]   Goal Area(s) Addresses:  Patient will effectively work with peer towards shared goal.  Patient will identify skills used to make activity successful.  Patient will share challenges and verbalize solution-driven approaches used. Patient will identify how skills used during activity can be used to reach post d/c goals.   Group Description: [employer]. Patients were provided the following materials: 5 drinking straws, 5 rubber bands, 5 paper clips, 2 index cards and 2 drinking cups. Using the provided materials patients were asked to build a launching mechanism to launch a ping pong ball across the room, approximately 10 feet. Patients were divided into teams of 3-5. Instructions required all materials be incorporated into the device, functionality of items left to the peer group's discretion.   Affect/Mood: N/A   Participation Level: Did not attend    Clinical Observations/Individualized Feedback:     Plan: Continue to engage patient in RT group sessions 2-3x/week.   Chelbi Herber-McCall, LRT,CTRS  03/24/2022 1:13 PM

## 2022-03-24 NOTE — Group Note (Signed)
Date:  03/24/2022 Time:  10:12 AM  Group Topic/Focus:  Goals Group:   The focus of this group is to help patients establish daily goals to achieve during treatment and discuss how the patient can incorporate goal setting into their daily lives to aide in recovery. Orientation:   The focus of this group is to educate the patient on the purpose and policies of crisis stabilization and provide a format to answer questions about their admission.  The group details unit policies and expectations of patients while admitted.    Participation Level:  Did Not Attend  Participation Quality:    Affect:    Cognitive:    Insight:  Engagement in Group:    Modes of Intervention:    Additional Comments:    Garvin Fila 03/24/2022, 10:12 AM

## 2022-03-24 NOTE — Plan of Care (Signed)
  Problem: Ischemic Stroke/TIA Tissue Perfusion: Goal: Complications of ischemic stroke/TIA will be minimized Outcome: Progressing   Problem: Coping: Goal: Will verbalize positive feelings about self Outcome: Progressing Goal: Will identify appropriate support needs Outcome: Progressing

## 2022-03-24 NOTE — Progress Notes (Signed)
Pt rates depression 3/10 and anxiety 4/10. Pt reports a fair appetite, and no physical problems. Pt denies SI/HI/AVH and verbally contracts for safety. Provided support and encouragement. Pt safe on the unit. Q 15 minute safety checks continued.

## 2022-03-25 DIAGNOSIS — F203 Undifferentiated schizophrenia: Secondary | ICD-10-CM | POA: Diagnosis not present

## 2022-03-25 MED ORDER — ONDANSETRON HCL 4 MG PO TABS
4.0000 mg | ORAL_TABLET | Freq: Once | ORAL | Status: AC
  Administered 2022-03-25: 4 mg via ORAL
  Filled 2022-03-25 (×2): qty 1
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
## 2022-03-25 NOTE — Progress Notes (Addendum)
D. Pt presents with a sad affect, depressed mood- calm, cooperative, but isolative today. Pt remained in bed- did not attend groups. PT reported that she was sad today, thinking about her kids. Pt currently denies SI/HI and AVH and agrees to contact staff before acting on any harmful thoughts.  A. Labs and vitals monitored. Pt given and educated on medications. Pt supported emotionally and encouraged to express concerns and ask questions.   R. Pt remains safe with 15 minute checks. Will continue POC.

## 2022-03-25 NOTE — Group Note (Signed)
LCSW Group Therapy Note   Group Date: 03/25/2022 Start Time: 1100 End Time: 1200  Type of Therapy and Topic:  Group Therapy:  Healthy and Unhealthy Supports  Participation Level:  Did Not Attend   Description of Group:  Patients in this group were introduced to the idea of adding a variety of healthy supports to address the various needs in their lives, especially in reference to their plans and focus for the new year.  Patients discussed what additional healthy supports could be helpful in their recovery and wellness after discharge in order to prevent future hospitalizations.   An emphasis was placed on using counselor, doctor, therapy groups, 12-step groups, and problem-specific support groups to expand supports.    Therapeutic Goals:   1)  discuss importance of adding supports to stay well once out of the hospital  2)  compare healthy versus unhealthy supports and identify some examples of each  3)  generate ideas and descriptions of healthy supports that can be added  4)  offer mutual support about how to address unhealthy supports  5)  encourage active participation in and adherence to discharge plan    Summary of Patient Progress:  Did not attend    Therapeutic Modalities:   Motivational Interviewing Brief Solution-Focused Therapy  Darleen Crocker, LCSWA 03/25/2022  1:10 PM

## 2022-03-25 NOTE — Progress Notes (Signed)
   03/25/22 0629  15 Minute Checks  Location Bedroom  Visual Appearance Calm  Behavior Composed  Sleep (Behavioral Health Patients Only)  Calculate sleep? (Click Yes once per 24 hr at 0600 safety check) Yes  Documented sleep last 24 hours 9.25

## 2022-03-25 NOTE — Progress Notes (Signed)
Adult Psychoeducational Group Note  Date:  03/25/2022 Time:  9:00 PM  Group Topic/Focus:  Wrap-Up Group:   The focus of this group is to help patients review their daily goal of treatment and discuss progress on daily workbooks.  Participation Level:  Active  Participation Quality:  Appropriate  Affect:  Appropriate  Cognitive:  Appropriate  Insight: Appropriate  Engagement in Group:  Engaged  Modes of Intervention:  Education and Exploration  Additional Comments:  Patient attended and participated in group tonight.  She reports that today she learn about strength.  Salley Scarlet [facility] 03/25/2022, 9:00 PM

## 2022-03-26 ENCOUNTER — Encounter (HOSPITAL_COMMUNITY): Payer: Self-pay

## 2022-03-26 DIAGNOSIS — F203 Undifferentiated schizophrenia: Secondary | ICD-10-CM | POA: Diagnosis not present

## 2022-03-26 NOTE — Progress Notes (Signed)
[facility] LCSW Progress Note   03/26/2022    1:50 PM  Jannifer Hick      Type of Note: Collateral Confirmation of Apartment    CSW and NP went to visit patient this morning to figure out her DC plan since she is expected to DC earlier part of next week. Patient stated that she was getting an apartment from a lady name Jonelle Sidle and gave CSW permission to call and confirm. However, CSW reached out to [place] and Jonelle Sidle confirmed that it was true , she is going to work something out with patient on Monday 03/29/22 to get apartment since patient stated that she get paid then and will have deposit and first months rent.     Signed:   Silas Flood, MSW, LCSWA 03/26/2022 1:50 PM

## 2022-03-26 NOTE — Progress Notes (Signed)
Adult Psychoeducational Group Note  Date:  03/26/2022 Time:  2:01 PM  Pt did not attend orientation group.
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
## 2022-03-26 NOTE — Progress Notes (Signed)
   03/26/22 9980  15 Minute Checks  Location Bedroom  Visual Appearance Calm  Behavior Sleeping  Sleep (Behavioral Health Patients Only)  Calculate sleep? (Click Yes once per 24 hr at 0600 safety check) Yes  Documented sleep last 24 hours 11.75

## 2022-03-26 NOTE — BH IP Treatment Plan (Signed)
Interdisciplinary Treatment and Diagnostic Plan Update  03/26/2022 Time of Session: [place] MRN: [id number]  Principal Diagnosis: Undifferentiated schizophrenia [facility])  Secondary Diagnoses: Principal Problem:   Undifferentiated schizophrenia ([place]) Active Problems:   Stimulant use disorder   Suicidal ideation   Current Medications:  Current Facility-Administered Medications  Medication Dose Route Frequency Provider Last Rate Last Admin   acetaminophen (TYLENOL) tablet 650 mg  650 mg Oral Q6H PRN Vesta Mixer, NP   650 mg at 03/24/22 1252   alum & mag hydroxide-simeth (MAALOX/MYLANTA) 200-200-20 MG/5ML suspension 30 mL  30 mL Oral Q4H PRN Vesta Mixer, NP       aspirin EC tablet 81 mg  81 mg Oral Daily Vesta Mixer, NP   81 mg at 03/26/22 0916   atorvastatin (LIPITOR) tablet 40 mg  40 mg Oral Daily Vesta Mixer, NP   40 mg at 03/26/22 0917   benztropine (COGENTIN) tablet 0.5 mg  0.5 mg Oral BID PRN Vesta Mixer, NP       clopidogrel (PLAVIX) tablet 75 mg  75 mg Oral Daily Vesta Mixer, NP   75 mg at 03/26/22 0917   DULoxetine (CYMBALTA) DR capsule 30 mg  30 mg Oral Daily Lindell Spar I, NP   30 mg at 03/26/22 1594   ferrous sulfate tablet 325 mg  325 mg Oral Q breakfast Vesta Mixer, NP   325 mg at 03/26/22 0917   gabapentin (NEURONTIN) capsule 300 mg  300 mg Oral Daily Vesta Mixer, NP   300 mg at 03/26/22 5859   metFORMIN (GLUCOPHAGE) tablet 500 mg  500 mg Oral Q breakfast Vesta Mixer, NP   500 mg at 03/26/22 0917   pantoprazole (PROTONIX) EC tablet 40 mg  40 mg Oral Daily Vesta Mixer, NP   40 mg at 03/26/22 2924   QUEtiapine (SEROQUEL) tablet 100 mg  100 mg Oral QHS Vesta Mixer, NP   100 mg at 03/25/22 2145   traZODone (DESYREL) tablet 150 mg  150 mg Oral QHS Vesta Mixer, NP   150 mg at 03/25/22 2105   PTA Medications: Medications Prior to Admission  Medication Sig Dispense Refill Last Dose   Accu-Chek Softclix Lancets  lancets USE TO CHECK BLOOD SUGAR DAILY (Patient taking differently: 1 each by Other route See admin instructions. USE TO CHECK BLOOD SUGAR DAILY) 100 each 0    aspirin EC 81 MG tablet Take 1 tablet (81 mg total) by mouth daily. Swallow whole. 30 tablet 12    atorvastatin (LIPITOR) 40 MG tablet Take 1 tablet (40 mg total) by mouth daily. 30 tablet 0    benzocaine (ORAJEL) 10 % mucosal gel Use as directed in the mouth or throat 4 (four) times daily as needed for mouth pain. (Patient taking differently: Use as directed 1 Application in the mouth or throat 4 (four) times daily as needed for mouth pain.) 5.3 g 0    benztropine (COGENTIN) 0.5 MG tablet Take 1 tablet (0.5 mg total) by mouth 2 (two) times daily as needed for tremors (EPS). 14 tablet 0    Blood Glucose Monitoring Suppl (ACCU-CHEK GUIDE) w/Device KIT Use to check blood sugar once daily. R73.03 (Patient taking differently: 1 each by Other route See admin instructions. Use to check blood sugar once daily. R73.03) 1 kit 0    clopidogrel (PLAVIX) 75 MG tablet Take 1 tablet (75 mg total) by mouth daily. Call neurology office for more refills. 14 tablet 1    ferrous sulfate 325 (65 FE) MG EC  tablet Take 325 mg by mouth daily with breakfast.      gabapentin (NEURONTIN) 300 MG capsule Take 300 mg by mouth daily.      glucose blood (ACCU-CHEK GUIDE) test strip USE TO CHECK BLOOD SUGAR ONCE DAILY (Patient taking differently: 1 each by Other route See admin instructions. USE TO CHECK BLOOD SUGAR ONCE DAILY) 100 strip 0    metFORMIN (GLUCOPHAGE) 500 MG tablet TAKE ONE TABLET BY MOUTH DAILY WITH BREAKFAST (Patient taking differently: Take 500 mg by mouth daily with breakfast.) 30 tablet 0    mirtazapine (REMERON) 30 MG tablet Take 1 tablet (30 mg total) by mouth at bedtime. 30 tablet 0    pantoprazole (PROTONIX) 40 MG tablet Take 1 tablet (40 mg total) by mouth daily. 30 tablet 0    QUEtiapine (SEROQUEL) 100 MG tablet Take 100 mg by mouth at bedtime.       traZODone (DESYREL) 150 MG tablet Take 1 tablet (150 mg total) by mouth at bedtime as needed for sleep. (Patient taking differently: Take 150 mg by mouth at bedtime.) 30 tablet 1    Vitamin D, Ergocalciferol, (DRISDOL) 1.25 MG (50000 UNIT) CAPS capsule Take 1 capsule (50,000 Units total) by mouth every 7 (seven) days. (Patient not taking: Reported on 03/02/2022) 4 capsule 2     Patient Stressors: Loss of sons in death   Substance abuse    Patient Strengths: Communication skills   Treatment Modalities: Medication Management, Group therapy, Case management,  1 to 1 session with clinician, Psychoeducation, Recreational therapy.   Physician Treatment Plan for Primary Diagnosis: Undifferentiated schizophrenia ([place]) Long Term Goal(s): Improvement in symptoms so as ready for discharge   Short Term Goals: Ability to identify and develop effective coping behaviors will improve Compliance with prescribed medications will improve Ability to identify triggers associated with substance abuse/mental health issues will improve Ability to identify changes in lifestyle to reduce recurrence of condition will improve Ability to verbalize feelings will improve Ability to disclose and discuss suicidal ideas Ability to demonstrate self-control will improve  Medication Management: Evaluate patient's response, side effects, and tolerance of medication regimen.  Therapeutic Interventions: 1 to 1 sessions, Unit Group sessions and Medication administration.  Evaluation of Outcomes: Progressing  Physician Treatment Plan for Secondary Diagnosis: Principal Problem:   Undifferentiated schizophrenia ([place]) Active Problems:   Stimulant use disorder   Suicidal ideation  Long Term Goal(s): Improvement in symptoms so as ready for discharge   Short Term Goals: Ability to identify and develop effective coping behaviors will improve Compliance with prescribed medications will improve Ability to identify triggers  associated with substance abuse/mental health issues will improve Ability to identify changes in lifestyle to reduce recurrence of condition will improve Ability to verbalize feelings will improve Ability to disclose and discuss suicidal ideas Ability to demonstrate self-control will improve     Medication Management: Evaluate patient's response, side effects, and tolerance of medication regimen.  Therapeutic Interventions: 1 to 1 sessions, Unit Group sessions and Medication administration.  Evaluation of Outcomes: Progressing   RN Treatment Plan for Primary Diagnosis: Undifferentiated schizophrenia ([place]) Long Term Goal(s): Knowledge of disease and therapeutic regimen to maintain health will improve  Short Term Goals: Ability to remain free from injury will improve, Ability to verbalize frustration and anger appropriately will improve, Ability to demonstrate self-control, Ability to participate in decision making will improve, Ability to verbalize feelings will improve, Ability to disclose and discuss suicidal ideas, Ability to identify and develop effective coping behaviors will improve,  and Compliance with prescribed medications will improve  Medication Management: RN will administer medications as ordered by provider, will assess and evaluate patient's response and provide education to patient for prescribed medication. RN will report any adverse and/or side effects to prescribing provider.  Therapeutic Interventions: 1 on 1 counseling sessions, Psychoeducation, Medication administration, Evaluate responses to treatment, Monitor vital signs and CBGs as ordered, Perform/monitor CIWA, COWS, AIMS and Fall Risk screenings as ordered, Perform wound care treatments as ordered.  Evaluation of Outcomes: Progressing   LCSW Treatment Plan for Primary Diagnosis: Undifferentiated schizophrenia ([place]) Long Term Goal(s): Safe transition to appropriate next level of care at discharge, Engage patient in  therapeutic group addressing interpersonal concerns.  Short Term Goals: Engage patient in aftercare planning with referrals and resources, Increase social support, Increase ability to appropriately verbalize feelings, Increase emotional regulation, Facilitate acceptance of mental health diagnosis and concerns, Facilitate patient progression through stages of change regarding substance use diagnoses and concerns, Identify triggers associated with mental health/substance abuse issues, and Increase skills for wellness and recovery  Therapeutic Interventions: Assess for all discharge needs, 1 to 1 time with Social worker, Explore available resources and support systems, Assess for adequacy in community support network, Educate family and significant other(s) on suicide prevention, Complete Psychosocial Assessment, Interpersonal group therapy.  Evaluation of Outcomes: Progressing   Progress in Treatment: Attending groups: Yes. Participating in groups: Yes. Taking medication as prescribed: Yes. Toleration medication: Yes. Family/Significant other contact made: Declined Consent Patient understands diagnosis: Yes. Discussing patient identified problems/goals with staff: Yes. Medical problems stabilized or resolved: Yes. Denies suicidal/homicidal ideation: Yes. Issues/concerns per patient self-inventory: Yes. Other:   New problem(s) identified: None Reported  New Short Term/Long Term Goal(s): Medication Stabilization  Patient Goals:  Coping Skills  Discharge Plan or Barriers: None Reported  Reason for Continuation of Hospitalization: Delusions  Depression Medication stabilization Suicidal ideation Withdrawal symptoms  Estimated Length of Stay: 3-7 Days  Last 3 [place] Suicide Severity Risk Score: Flowsheet Row Admission (Current) from 03/21/2022 in [place] [facility] Most recent reading at 03/21/2022 10:22 PM ED from 03/21/2022 in [place] Most recent reading at 03/21/2022  8:47 AM ED to Hosp-Admission (Discharged) from 03/01/2022 in [place] Most recent reading at 03/01/2022 11:25 AM  C-SSRS RISK CATEGORY High Risk High Risk No Risk       Last PHQ 2/9 Scores:    07/06/2021    1:45 PM 02/25/2021    9:02 AM 11/10/2020    1:24 PM  Depression screen PHQ 2/9  Decreased Interest _0 Down, Depressed, Hopeless _1 PHQ - 2 Score _2 Altered sleeping _3 Tired, decreased energy _4 Change in appetite _5 Feeling bad or failure about yourself  2 0 0  Trouble concentrating 1 0 0  Moving slowly or fidgety/restless 2 0 0  Suicidal thoughts 0 0 0  PHQ-9 Score _6 detox, medication management for mood stabilization; elimination of SI thoughts; development of comprehensive mental wellness/sobriety plan      Scribe for Treatment Team: Windle Guard, LCSW 03/26/2022 11:27 AM

## 2022-03-26 NOTE — Progress Notes (Signed)
Patient alert, verbal and able to make needs known. Pt has been isolative to her room for the most part today, but did go to lunch. She rates her depression, hopelessness and anxiety 10/10 today. Pts goal for the day is "me not wanting to die." Patient contracts for safety on unit. Denies SI/HI/AVH at present. She is compliant with medications and cooperative with staff. Verbal encouragement and support given. Q65mn safety checks continue.

## 2022-03-26 NOTE — Progress Notes (Signed)
Adult Psychoeducational Group Note  Date:  03/26/2022 Time:  9:55 PM  Group Topic/Focus:  AA Group  Participation Level:  Did Not Attend  Participation Quality:   n/a  Affect:   n/a  Cognitive:   n/a  Insight: None  Engagement in Group:   n/a  Modes of Intervention:   n/a  Additional Comments:   Pt did not attend the AA/Wrap Up group.  Joanne Gonzalez 03/26/2022, 9:55 PM

## 2022-03-26 NOTE — Plan of Care (Signed)
  Problem: Education: Goal: Knowledge of disease or condition will improve Outcome: Progressing Goal: Knowledge of secondary prevention will improve (MUST DOCUMENT ALL) Outcome: Progressing Goal: Knowledge of patient specific risk factors will improve Elta [place] N/A or DELETE if not current risk factor) Outcome: Progressing   Problem: Ischemic Stroke/TIA Tissue Perfusion: Goal: Complications of ischemic stroke/TIA will be minimized Outcome: Progressing   Problem: Coping: Goal: Will verbalize positive feelings about self Outcome: Progressing Goal: Will identify appropriate support needs Outcome: Progressing   Problem: Health Behavior/Discharge Planning: Goal: Ability to manage health-related needs will improve Outcome: Progressing Goal: Goals will be collaboratively established with patient/family Outcome: Progressing   Problem: Self-Care: Goal: Ability to participate in self-care as condition permits will improve Outcome: Progressing Goal: Verbalization of feelings and concerns over difficulty with self-care will improve Outcome: Progressing Goal: Ability to communicate needs accurately will improve Outcome: Progressing   Problem: Nutrition: Goal: Risk of aspiration will decrease Outcome: Progressing Goal: Dietary intake will improve Outcome: Progressing   Problem: Education: Goal: Utilization of techniques to improve thought processes will improve Outcome: Progressing Goal: Knowledge of the prescribed therapeutic regimen will improve Outcome: Progressing   Problem: Activity: Goal: Interest or engagement in leisure activities will improve Outcome: Progressing Goal: Imbalance in normal sleep/wake cycle will improve Outcome: Progressing   Problem: Coping: Goal: Coping ability will improve Outcome: Progressing Goal: Will verbalize feelings Outcome: Progressing   Problem: Health Behavior/Discharge Planning: Goal: Ability to make decisions will improve Outcome:  Progressing Goal: Compliance with therapeutic regimen will improve Outcome: Progressing   Problem: Role Relationship: Goal: Will demonstrate positive changes in social behaviors and relationships Outcome: Progressing   Problem: Safety: Goal: Ability to disclose and discuss suicidal ideas will improve Outcome: Progressing Goal: Ability to identify and utilize support systems that promote safety will improve Outcome: Progressing   Problem: Self-Concept: Goal: Will verbalize positive feelings about self Outcome: Progressing Goal: Level of anxiety will decrease Outcome: Progressing   Problem: Education: Goal: Knowledge of [facility] Education information/materials will improve Outcome: Progressing Goal: Emotional status will improve Outcome: Progressing Goal: Mental status will improve Outcome: Progressing Goal: Verbalization of understanding the information provided will improve Outcome: Progressing   Problem: Activity: Goal: Interest or engagement in activities will improve Outcome: Progressing Goal: Sleeping patterns will improve Outcome: Progressing   Problem: Coping: Goal: Ability to verbalize frustrations and anger appropriately will improve Outcome: Progressing Goal: Ability to demonstrate self-control will improve Outcome: Progressing   Problem: Health Behavior/Discharge Planning: Goal: Identification of resources available to assist in meeting health care needs will improve Outcome: Progressing Goal: Compliance with treatment plan for underlying cause of condition will improve Outcome: Progressing   Problem: Physical Regulation: Goal: Ability to maintain clinical measurements within normal limits will improve Outcome: Progressing   Problem: Safety: Goal: Periods of time without injury will increase Outcome: Progressing   Problem: Education: Goal: Ability to make informed decisions regarding treatment will improve Outcome: Progressing    Problem: Coping: Goal: Coping ability will improve Outcome: Progressing   Problem: Self-Concept: Goal: Ability to disclose and discuss suicidal ideas will improve Outcome: Progressing Goal: Will verbalize positive feelings about self Outcome: Progressing

## 2022-03-26 NOTE — Group Note (Signed)
Recreation Therapy Group Note   Group Topic:Stress Management  Group Date: 03/26/2022 Start Time: 0932 End Time: 0951 Facilitators: Nanda Bittick-McCall, LRT,CTRS Location: [address] Dayroom   Goal Area(s) Addresses:  Patient will identify positive stress management techniques. Patient will identify benefits of using stress management post d/c.  Group Description:  Meditation.  LRT played a meditation that focused on being grateful for the person in your life who has been supportive or made them feel good in tough times.  Patients were to it back, focus on their breathing and listen to the meditation as it played to engage in the meditation.   Affect/Mood: N/A   Participation Level: Did not attend    Clinical Observations/Individualized Feedback:     Plan: Continue to engage patient in RT group sessions 2-3x/week.   Jimmye Wisnieski-McCall, LRT,CTRS 03/26/2022 12:12 PM

## 2022-03-27 DIAGNOSIS — F203 Undifferentiated schizophrenia: Secondary | ICD-10-CM | POA: Diagnosis not present

## 2022-03-27 MED ORDER — DULOXETINE HCL 60 MG PO CPEP
60.0000 mg | ORAL_CAPSULE | Freq: Every day | ORAL | Status: DC
  Administered 2022-03-28 – 2022-03-30 (×3): 60 mg via ORAL
  Filled 2022-03-27 (×5): qty 1

## 2022-03-27 MED ORDER — METRONIDAZOLE 250 MG PO TABS
250.0000 mg | ORAL_TABLET | Freq: Two times a day (BID) | ORAL | Status: DC
  Administered 2022-03-27 – 2022-03-30 (×6): 250 mg via ORAL
  Filled 2022-03-27 (×13): qty 1

## 2022-03-27 NOTE — [facility] Group Notes (Incomplete)
.  Psychoeducational Group Note    Date:  03/27/2022 Time:1300-1400    Purpose of Group: . The group focus' on teaching patients on how to identify their needs and their Life Skills:  A group where two lists are made. What people need and what are things that we do that are unhealthy. The lists are developed by the patients and it is explained that we often do the actions that are not healthy to get our list of needs met.  Goal:: to develop the coping skills needed to get their needs met  Participation Level:  Active  Participation Quality:  Appropriate  Affect:  Appropriate  Cognitive:  Oriented  Insight: Improving  Engagement in Group:  Engaged  Modes of Intervention:  Activity, Discussion, Education, and Support  Additional Comments:    Amire Gossen A    
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
## 2022-03-27 NOTE — Plan of Care (Signed)
  Problem: Education: Goal: Utilization of techniques to improve thought processes will improve Outcome: Progressing Goal: Knowledge of the prescribed therapeutic regimen will improve Outcome: Progressing   Problem: Activity: Goal: Interest or engagement in leisure activities will improve Outcome: Progressing Goal: Imbalance in normal sleep/wake cycle will improve Outcome: Progressing   Problem: Coping: Goal: Coping ability will improve Outcome: Progressing Goal: Will verbalize feelings Outcome: Progressing   Problem: Health Behavior/Discharge Planning: Goal: Ability to make decisions will improve Outcome: Progressing Goal: Compliance with therapeutic regimen will improve Outcome: Progressing   Problem: Role Relationship: Goal: Will demonstrate positive changes in social behaviors and relationships Outcome: Progressing   Problem: Safety: Goal: Ability to disclose and discuss suicidal ideas will improve Outcome: Progressing Goal: Ability to identify and utilize support systems that promote safety will improve Outcome: Progressing   Problem: Self-Concept: Goal: Will verbalize positive feelings about self Outcome: Progressing Goal: Level of anxiety will decrease Outcome: Progressing   Problem: Education: Goal: Knowledge of [place] [facility] Education information/materials will improve Outcome: Progressing Goal: Emotional status will improve Outcome: Progressing Goal: Mental status will improve Outcome: Progressing Goal: Verbalization of understanding the information provided will improve Outcome: Progressing   Problem: Activity: Goal: Interest or engagement in activities will improve Outcome: Progressing Goal: Sleeping patterns will improve Outcome: Progressing   Problem: Coping: Goal: Ability to verbalize frustrations and anger appropriately will improve Outcome: Progressing Goal: Ability to demonstrate self-control will improve Outcome: Progressing    Problem: Health Behavior/Discharge Planning: Goal: Identification of resources available to assist in meeting health care needs will improve Outcome: Progressing Goal: Compliance with treatment plan for underlying cause of condition will improve Outcome: Progressing   Problem: Physical Regulation: Goal: Ability to maintain clinical measurements within normal limits will improve Outcome: Progressing   Problem: Safety: Goal: Periods of time without injury will increase Outcome: Progressing   Problem: Education: Goal: Ability to make informed decisions regarding treatment will improve Outcome: Progressing   Problem: Coping: Goal: Coping ability will improve Outcome: Progressing   Problem: Health Behavior/Discharge Planning: Goal: Identification of resources available to assist in meeting health care needs will improve Outcome: Progressing   Problem: Medication: Goal: Compliance with prescribed medication regimen will improve Outcome: Progressing   Problem: Self-Concept: Goal: Ability to disclose and discuss suicidal ideas will improve Outcome: Progressing Goal: Will verbalize positive feelings about self Outcome: Progressing   

## 2022-03-27 NOTE — Progress Notes (Signed)
Pt rates depression 10/10 and anxiety 8/10. Pt reports a fair appetite, and no physical problems. Pt denies SI/HI/AVH and verbally contracts for safety. Provided support and encouragement. Pt safe on the unit. Q 15 minute safety checks continued.

## 2022-03-27 NOTE — [facility] Group Notes (Signed)
Goals Group 03/27/22   Group Focus: affirmation, clarity of thought, and goals/reality orientation Treatment Modality:  Psychoeducation Interventions utilized were assignment, group exercise, and support Purpose: To be able to understand and verbalize the reason for their admission to the hospital. To understand that the medication helps with their chemical imbalance but they also need to work on their choices in life. To be challenged to develop a list of 30 positives about themselves. Also introduce the concept that "feelings" are not reality.  Participation Level:  Active  Participation Quality:  Appropriate  Affect:  Appropriate  Cognitive:  Appropriate  Insight:  Improving  Engagement in Group:  Engaged  Additional Comments: Rates her energy at a 5/10. States the reason she is here is because she was so overwhelmed with life. Participated in the group.  Joanne Gonzalez

## 2022-03-27 NOTE — [facility] Group Notes (Signed)
Pt attended wrap-up group. Pt New Year resolution is to go into the new year without depression.

## 2022-03-27 NOTE — Progress Notes (Signed)
Patient is complaining of a vaginal odor today and reports she thinks she has a yeast infection. Otila Kluver NP made aware.

## 2022-03-27 NOTE — Group Note (Signed)
[facility] LCSW Group Therapy Note  Date:  03/27/2022   Type of Therapy and Topic:  Group Therapy:  Focus for the New Year  Participation Level:  Minimal   Description of Group:  The focus of this group was to provide patients with an opportunity to think about and discuss what they can work on this coming year that will result in them being happier and healthier one year from today.  It was reviewed how "new year's resolutions" often fade in importance within a few days, so patients were encouraged to think in a broader, more impactful way about what they wish to focus on to change their lives.  Therapeutic Goals Patients discussed in general the benefit of having goals to work on Patients described their own personal goals/focus for the next year that will enable them to be happier and healthier Patients received encouragement from each other and CSW Patients provided support and ideas to each other  Summary of Patient Progress: During group, patient expressed that she believes if she does not go into the year in a state of relapse and depression, she will not spend the year that way.  She added that she will not "purposely" seek this, however, but will instead let whatever happens naturally to do so.  Later in group she started talking about losing her sons and how thinking about it "freaks" her out.  She did not appear "freaked out" during the several times she mentioned this, but she did start to cry and left the room.   Therapeutic Modalities Processing   Selmer Dominion, LCSW

## 2022-03-28 DIAGNOSIS — F203 Undifferentiated schizophrenia: Secondary | ICD-10-CM | POA: Diagnosis not present

## 2022-03-28 MED ORDER — IBUPROFEN 400 MG PO TABS
400.0000 mg | ORAL_TABLET | Freq: Four times a day (QID) | ORAL | Status: DC | PRN
  Administered 2022-03-28 – 2022-03-29 (×3): 400 mg via ORAL
  Filled 2022-03-28 (×3): qty 1

## 2022-03-28 MED ORDER — BENZOCAINE 10 % MT GEL
Freq: Three times a day (TID) | OROMUCOSAL | Status: DC | PRN
  Administered 2022-03-28: 1 via OROMUCOSAL
  Filled 2022-03-28: qty 9

## 2022-03-28 NOTE — BHH Group Notes (Signed)
Pt attended wrap up group 

## 2022-03-28 NOTE — Group Note (Signed)
LCSW Group Therapy Note   03/28/2022 5:12 PM  Type of Therapy and Topic:  Group Therapy:   Introduction to Cognitive-Behavioral Therapy    Participation Level:  Did Not Attend  Description of Group: Participants were asked to identify their overall emotion at the beginning of group.  Patients were asked to identify thoughts they often have while experiencing those emotions, then the opposite -- thoughts that might lead to these emotions.   We also discussed what behaviors might come out of either those thoughts or feelings.  The model of the relationships between feelings, thoughts, and behaviors was explained and diagrammed on the white board.  The remainder of the meeting was spent processing patients' expressing emotions through the CBT lens.  There was an emphasis on the need to check to see if feelings and/or thoughts are based on accurate information.  We also talked about how to appropriately use positive affirmations.  Patients found they could relate to each other's feelings even though their experiences are quite different.  Therapeutic Goals:   Patient will identify their current emotional state Patient will understand the relationship between thoughts, emotions and triggers.  Patient will state at least one positive affirmation they are willing to use at this time   Summary of Patient Progress:   N/A     Therapeutic Modalities: Cognitive Behavioral Therapy Motivational Interviewing

## 2022-03-28 NOTE — Plan of Care (Signed)
  Problem: Education: Goal: Knowledge of disease or condition will improve Outcome: Progressing Goal: Knowledge of secondary prevention will improve (MUST DOCUMENT ALL) Outcome: Progressing Goal: Knowledge of patient specific risk factors will improve Elta Guadeloupe N/A or DELETE if not current risk factor) Outcome: Progressing   Problem: Ischemic Stroke/TIA Tissue Perfusion: Goal: Complications of ischemic stroke/TIA will be minimized Outcome: Progressing   Problem: Coping: Goal: Will verbalize positive feelings about self Outcome: Progressing Goal: Will identify appropriate support needs Outcome: Progressing

## 2022-03-28 NOTE — Progress Notes (Signed)
Pt presents with depressed mood, affect flat. Joanne Gonzalez reports she continues to feel depressed. Joanne Gonzalez has been isolative to her room, quiet, not engaged with peers much throughout shift thus far. Pt did complain of tooth ache , which she states '' is hurting so bad it's making my head hurt. '' Providers made aware. Pt still reports suicidal ideation, and reports continued grief over her sons .  Pt is able to agree to be safe on the unit at this time.  Denies any AH or VH. Stayed in room for lunch and accepted meal tray from writer. Pt is safe, will con't to monitor.

## 2022-03-28 NOTE — Progress Notes (Signed)
   03/28/22 0600  15 Minute Checks  Location Bedroom  Visual Appearance Calm  Behavior Sleeping  Sleep (Behavioral Health Patients Only)  Calculate sleep? (Click Yes once per 24 hr at 0600 safety check) Yes  Documented sleep last 24 hours 9.25

## 2022-03-28 NOTE — Progress Notes (Signed)
Pt given urine cup and aware of need for sample

## 2022-03-28 NOTE — BHH Group Notes (Signed)
Adult Psychoeducational Group  Date:  03/28/2022 Time:  1100-1200  Group Topic/Focus: Continuation of the group from Saturday. Looking at the lists that were created and talking about what needs to be done with the homework of 30 positives about themselves.                                     Talking about taking their power back and helping themselves to develop a positive self esteem.      Participation Quality:did not attend

## 2022-03-28 NOTE — BHH Group Notes (Signed)
Adult Psychoeducational Group Note Date:  03/28/2022 Time:  0900-1000 Group Topic/Focus: PROGRESSIVE RELAXATION. A group where deep breathing is taught and tensing and relaxation muscle groups is used. Imagery is used as well.  Pts are asked to imagine 3 pillars that hold them up when they are not able to hold themselves up and to share that with the group.   Participation Level:  did not attend   : Paulino Rily

## 2022-03-28 NOTE — Plan of Care (Signed)
  Problem: Coping: Goal: Will identify appropriate support needs Outcome: Progressing   Problem: Health Behavior/Discharge Planning: Goal: Goals will be collaboratively established with patient/family Outcome: Progressing   Problem: Education: Goal: Knowledge of the prescribed therapeutic regimen will improve Outcome: Progressing   Problem: Coping: Goal: Will verbalize feelings Outcome: Progressing   Problem: Safety: Goal: Ability to identify and utilize support systems that promote safety will improve Outcome: Progressing
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
## 2022-03-28 NOTE — Progress Notes (Signed)
   03/27/22 2107  Psych Admission Type (Psych Patients Only)  Admission Status Voluntary  Psychosocial Assessment  Patient Complaints Anxiety;Depression  Eye Contact Fair  Facial Expression Sad  Affect Anxious;Depressed  Speech Logical/coherent  Interaction Assertive  Motor Activity Slow  Appearance/Hygiene Unremarkable  Behavior Characteristics Cooperative;Anxious  Mood Depressed;Anxious  Thought Process  Coherency WDL  Content WDL  Delusions None reported or observed  Perception WDL  Hallucination None reported or observed  Judgment WDL  Confusion None  Danger to Self  Current suicidal ideation? Passive  Agreement Not to Harm Self Yes  Description of Agreement Verbal  Danger to Others  Danger to Others None reported or observed

## 2022-03-29 DIAGNOSIS — F203 Undifferentiated schizophrenia: Secondary | ICD-10-CM | POA: Diagnosis not present

## 2022-03-29 MED ORDER — CLINDAMYCIN HCL 150 MG PO CAPS
150.0000 mg | ORAL_CAPSULE | Freq: Three times a day (TID) | ORAL | Status: DC
  Administered 2022-03-29 – 2022-03-30 (×3): 150 mg via ORAL
  Filled 2022-03-29 (×10): qty 1

## 2022-03-29 NOTE — Progress Notes (Addendum)
Pt presents with depressed mood, affect flat. Joanne Gonzalez reports she continues to feel depressed, and continues to report bad tooth ache which is her main concern.  Joanne Gonzalez has been isolative to her room, quiet, not engaged with peers much throughout shift thus far, and observed lying in bed after pain medications given holding heat pack for pain relief. She states '' I just don't feel like going out right now, I'm just gonna rest. '' Pt pleasant with writer and compliant with am medications. . Pt  Providers made aware again in treatment team of pt concerns of toothache, and prn medications given. Pt reports '' I have some orajel left in my room that has been helpful to put on after eating'' Pt has been able to eat and drink and able to make her needs known. Pt is safe, will con't to monitor. Denies any AH or VH and no signs of responding to internal stimuli. Pt is safe.

## 2022-03-29 NOTE — B[facility] Group Notes (Signed)
Pt did not attend goals group. 

## 2022-03-29 NOTE — Progress Notes (Signed)
   03/29/22 2200  Psych Admission Type (Psych Patients Only)  Admission Status Voluntary  Psychosocial Assessment  Patient Complaints Depression;Other (Comment) (tooth pain)  Eye Contact Fair  Facial Expression Flat  Affect Depressed  Speech Logical/coherent  Interaction Assertive  Motor Activity Slow  Appearance/Hygiene Unremarkable  Behavior Characteristics Cooperative  Mood Depressed  Thought Process  Coherency WDL  Content WDL  Delusions None reported or observed  Perception WDL  Hallucination None reported or observed  Judgment WDL  Confusion None  Danger to Self  Current suicidal ideation? Passive  Agreement Not to Harm Self Yes  Description of Agreement Verbal  Danger to Others  Danger to Others None reported or observed

## 2022-03-29 NOTE — BHH Group Notes (Signed)
Pt did not attend AA group
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
## 2022-03-29 NOTE — Progress Notes (Signed)
D) Pt received calm, visible, participating in milieu, and in no acute distress. Pt A & O x4. Pt denies SI, HI, A/ V H, depression, anxiety and pain at this time. A) Pt encouraged to drink fluids. Pt encouraged to come to staff with needs. Pt encouraged to attend and participate in groups. Pt encouraged to set reachable goals.  R) Pt remained safe on unit, in no acute distress, will continue to assess.     03/28/22 1930  Psych Admission Type (Psych Patients Only)  Admission Status Voluntary  Psychosocial Assessment  Patient Complaints Depression;Crying spells  Eye Contact Fair  Facial Expression Flat  Affect Depressed  Speech Logical/coherent  Interaction Assertive  Motor Activity Slow  Appearance/Hygiene Unremarkable  Behavior Characteristics Cooperative  Mood Depressed  Thought Process  Coherency WDL  Content WDL  Delusions None reported or observed  Perception WDL  Hallucination None reported or observed  Judgment WDL  Confusion None  Danger to Self  Current suicidal ideation? Passive  Agreement Not to Harm Self Yes  Description of Agreement verbal  Danger to Others  Danger to Others None reported or observed

## 2022-03-30 MED ORDER — CLINDAMYCIN HCL 150 MG PO CAPS: 150.0000 mg | ORAL_CAPSULE | Freq: Three times a day (TID) | ORAL | 0 refills | Status: AC

## 2022-03-30 MED ORDER — TRAZODONE HCL 150 MG PO TABS: 150.0000 mg | ORAL_TABLET | Freq: Every day | ORAL | 0 refills | Status: AC

## 2022-03-30 MED ORDER — DULOXETINE HCL 60 MG PO CPEP: 60.0000 mg | ORAL_CAPSULE | Freq: Every day | ORAL | 0 refills | Status: AC

## 2022-03-30 MED ORDER — METRONIDAZOLE 250 MG PO TABS: 250.0000 mg | ORAL_TABLET | Freq: Two times a day (BID) | ORAL | 0 refills | Status: AC

## 2022-03-30 MED ORDER — BENZOCAINE 10 % MT GEL: Freq: Three times a day (TID) | OROMUCOSAL | 0 refills | Status: AC | PRN
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
## 2022-03-30 NOTE — Group Note (Signed)
LCSW Group Therapy Note   Group Date: 03/30/2022 Start Time: 1100 End Time: 1200  Type of Therapy and Topic: Group Therapy: Effective Communication   Participation Level: Did not attend    Description of Group:  In this group patients will be asked to identify their own styles of communication as well as defining and identifying passive, assertive, and aggressive styles of communication. Participants will identify strategies to communicate in a more assertive manner in an effort to appropriately meet their needs. This group will be process-oriented, with patients participating in exploration of their own experiences as well as giving and receiving support and challenge from other group members.   Therapeutic Goals: 1. Patient will identify their personal communication style. 2. Patient will identify passive, assertive, and aggressive forms of communication. 3. Patient will identify strategies for developing more effective communication to appropriately meet their needs.    Summary of Patient Progress: Did not attend     Therapeutic Modalities:  Communication Skills Solution Focused Therapy Motivational Interviewing  Darleen Crocker, [place] 03/30/2022  1:59 PM

## 2022-03-30 NOTE — Progress Notes (Signed)
  [facility] Adult Case Management Discharge Plan :  Will you be returning to the same living situation after discharge:  No.Patient will be DC to an apartment she arranged for herself while being here and CSW was able to confirm  At discharge, do you have transportation home?: No.CSW arranged transport for patient  Do you have the ability to pay for your medications: Yes,  [facility]   Release of information consent forms completed and in the chart;  Patient's signature needed at discharge.  Patient to Follow up at:  [facility]. Go to.   Specialty: Urgent Care Why: Please go to this provider and arrive no later than 7:20 AM Monday-Friday to obtain an assessment and establish med mangement and therapy services. Appointments are first come, first servse basis. Contact information: [place]. Go to.   Specialty: Hospice and Palliative Medicine Why: Please go to this grief counseling center for support groups and one on one grief counseling services to help you cope wiht the loss of your loved one. Contact information: [place], [facility]. Go on 04/22/2022.   Why: Please follow up with this provider for med management, if unable to obtain med management at [facility]). Contact information: [address] [place] [address] [phone]                 Next level of care provider has access to [place] and Suicide Prevention discussed: Yes,  [place] ( housing ) - [phone]     Has patient been referred to the Quitline?: N/A patient is not a smoker  Patient has been referred for addiction treatment: N/A  Sherre Lain, LCSWA 03/30/2022, 2:05 PM

## 2022-03-30 NOTE — Progress Notes (Signed)
   03/30/22 3736  15 Minute Checks  Location Dayroom  Visual Appearance Calm  Behavior Composed  Sleep (Behavioral Health Patients Only)  Calculate sleep? (Click Yes once per 24 hr at 0600 safety check) Yes  Documented sleep last 24 hours 7.25

## 2022-03-30 NOTE — Progress Notes (Signed)
D/c Instructions-Education: Christie [place] Discharge instructions given to patient , she verbalized understanding od D/C information given to her.. D/c education completed with patient including follow up instructions, medication list, d/c activities limitations if indicated, with other d/c instructions as indicated by MD - patient able to verbalize understanding, all questions fully answered. Patient denied SI/HI/AVH. Suicide prevention information given and discussed with patient and understanding of D/C information verbalized by patient. All questions asked were answered by staff, patient stated they have no questions at time of D/C. Patient received all belongings brought to the unit at discharge. Patient showed appreciation of care given to him at the [facility] unit. Patient was escorted to the lobby and left for home in private auto at 1245.    Dorris Carnes, RN

## 2022-03-30 NOTE — Plan of Care (Signed)
  Problem: Education: Goal: Knowledge of disease or condition will improve Outcome: Completed/Met Goal: Knowledge of secondary prevention will improve (MUST DOCUMENT ALL) Outcome: Completed/Met Goal: Knowledge of patient specific risk factors will improve [place] N/A or DELETE if not current risk factor) Outcome: Completed/Met   Problem: Coping: Goal: Will verbalize positive feelings about self Outcome: Completed/Met Goal: Will identify appropriate support needs Outcome: Completed/Met

## 2022-03-31 ENCOUNTER — Other Ambulatory Visit: Payer: Self-pay | Admitting: Internal Medicine

## 2022-03-31 DIAGNOSIS — R7303 Prediabetes: Secondary | ICD-10-CM

## 2022-05-18 ENCOUNTER — Encounter: Payer: Self-pay | Admitting: Neurology

## 2022-05-18 ENCOUNTER — Ambulatory Visit: Payer: Medicaid Other | Admitting: Neurology

## 2022-06-15 IMAGING — DX DG ANKLE COMPLETE 3+V*L*
3 series · 3 of 3 positions shown · non-contrast
Comparison: None.

CLINICAL DATA: Initial evaluation for acute ankle pain.

EXAM:
LEFT ANKLE COMPLETE - 3+ VIEW

[ankle ap]
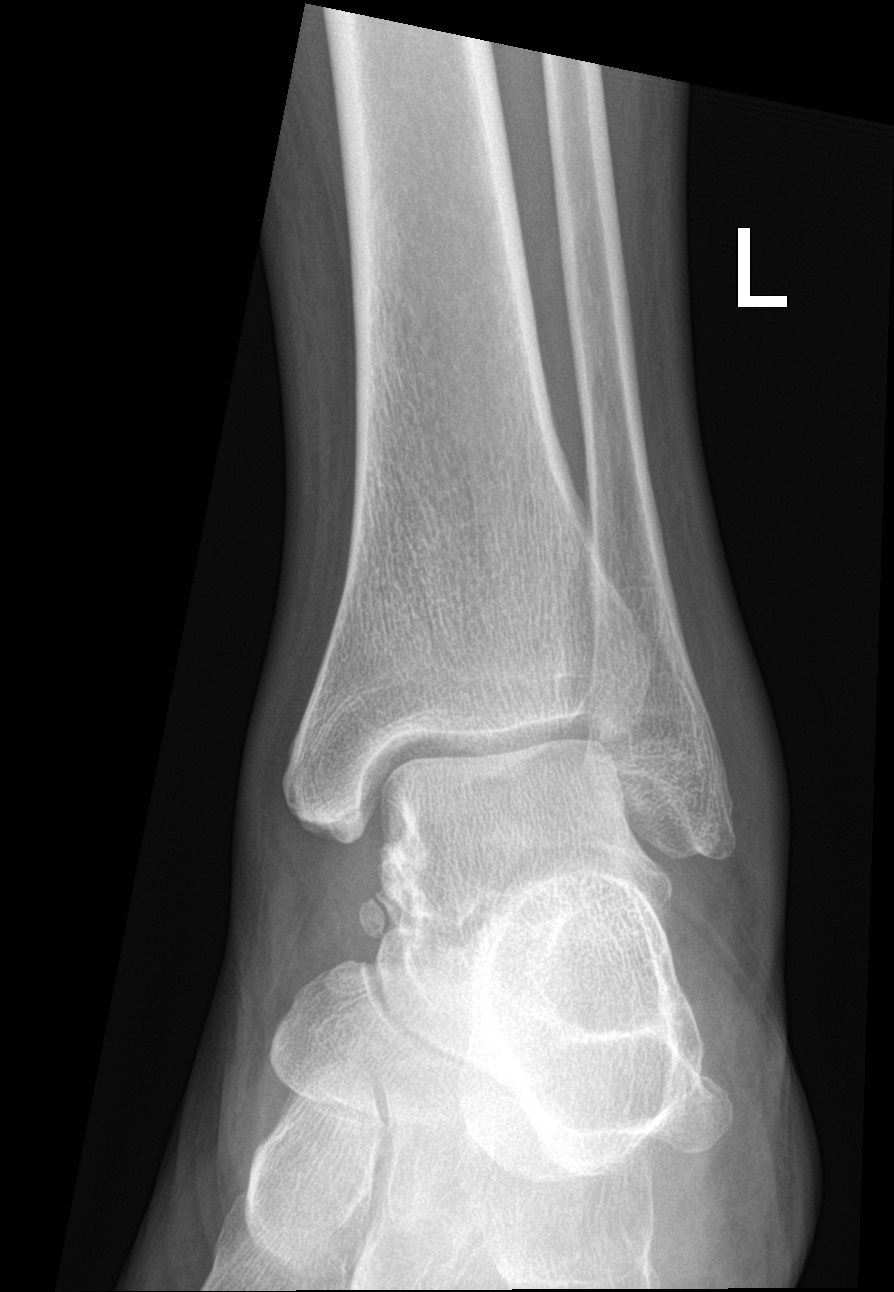

[ankle obl]
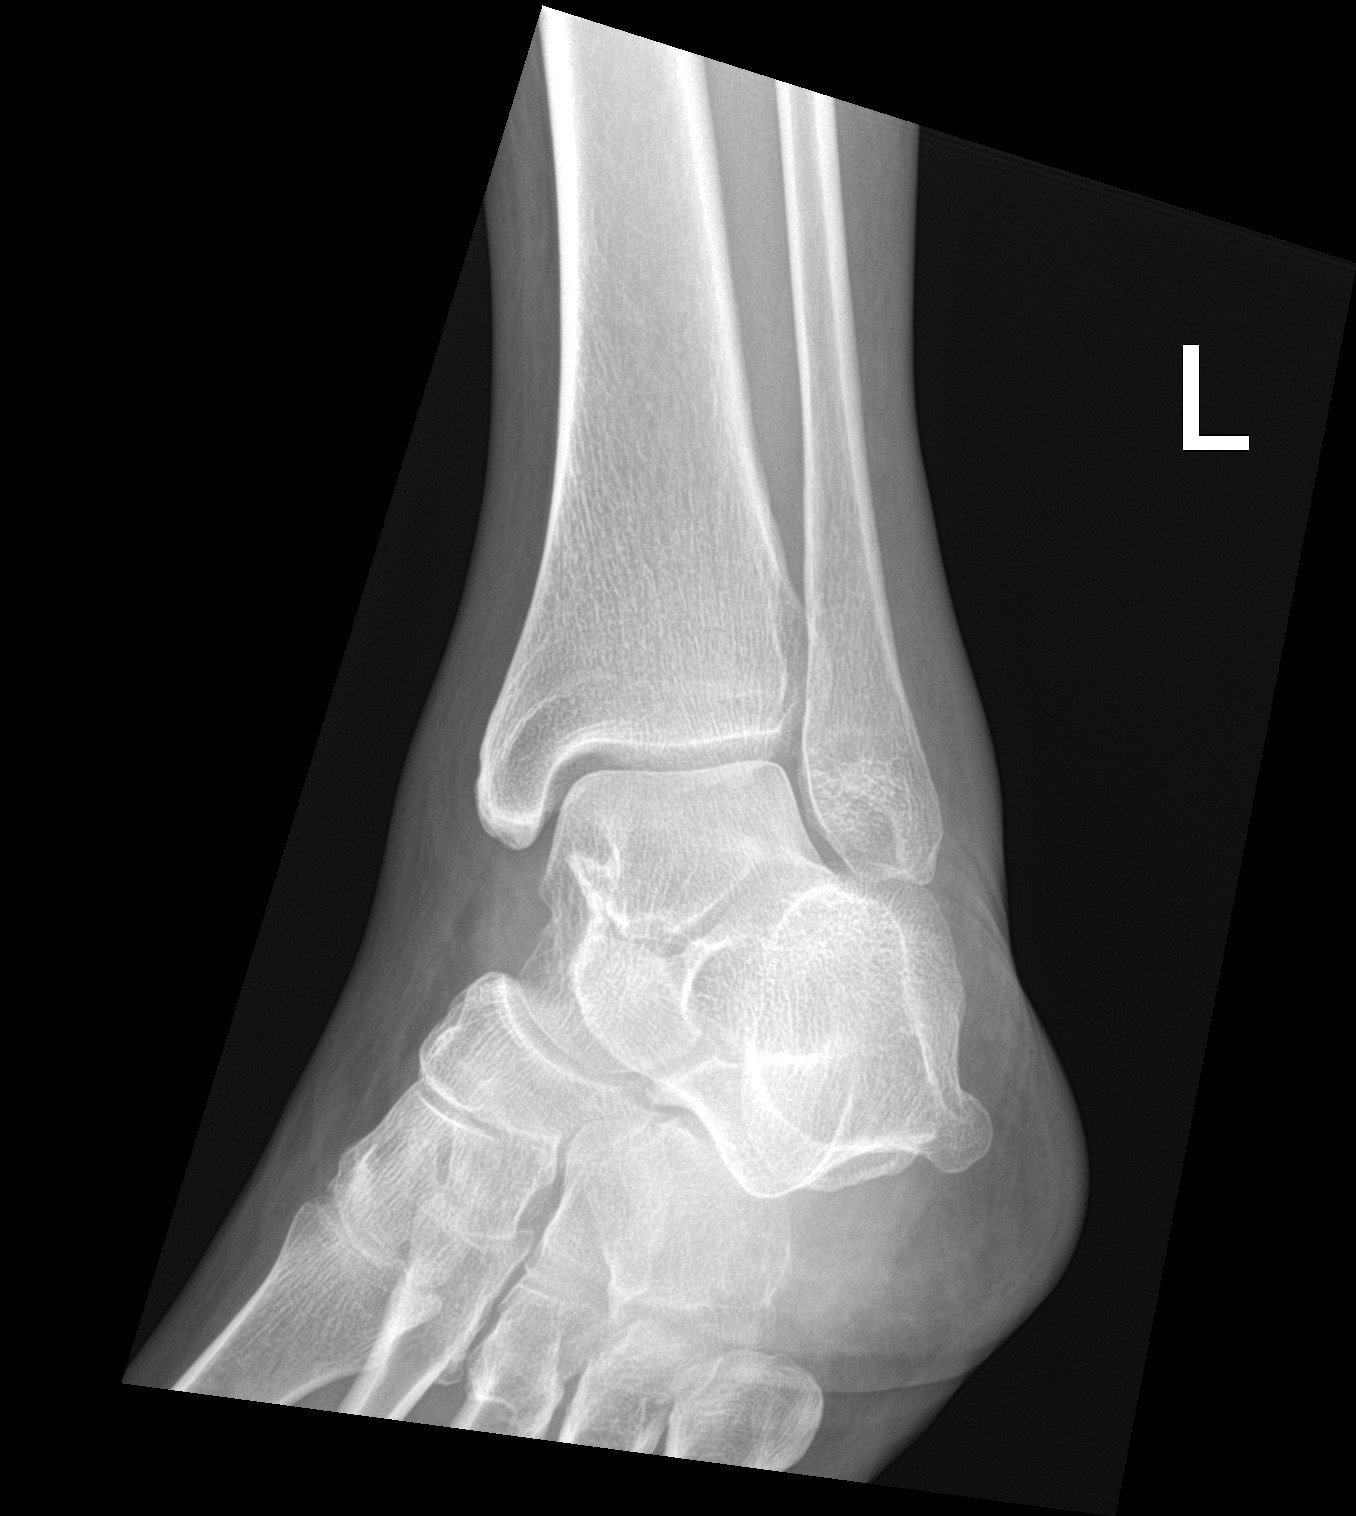

[ankle lat]
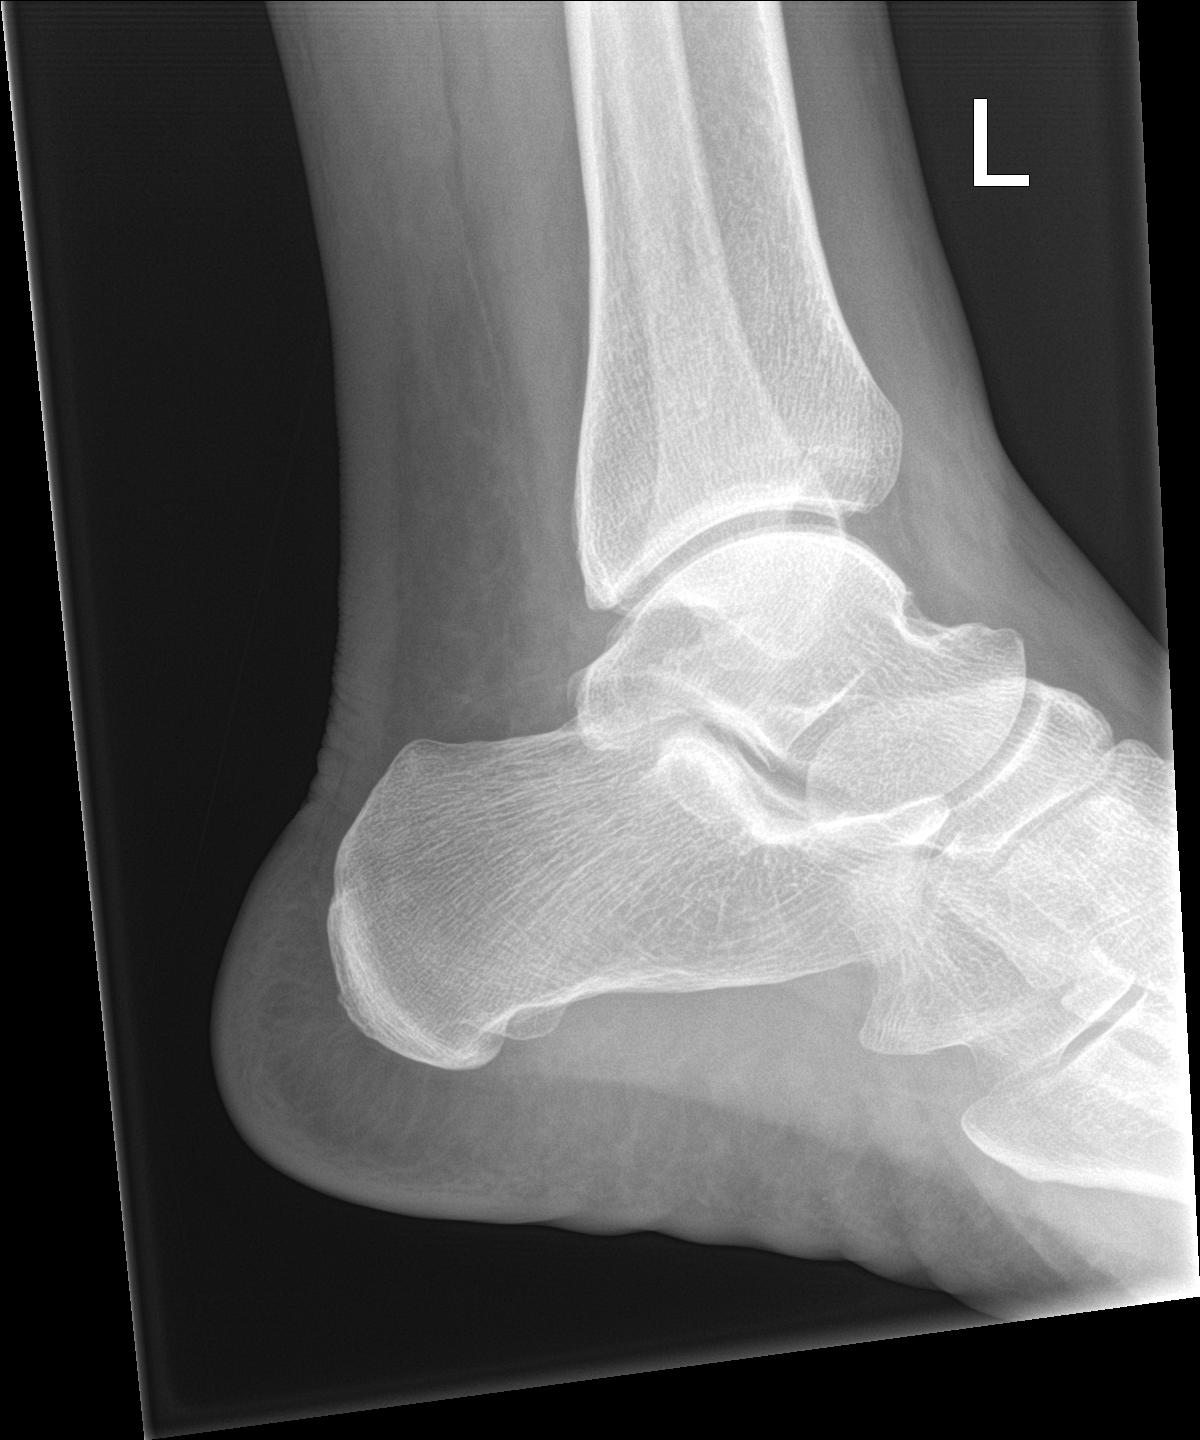

[3 of 3 positions shown; findings below may reference images not displayed]

FINDINGS: No acute fracture dislocation. Ankle mortise approximated.
Corticated osseous density inferior to the medial malleolus could be
related to remote trauma or possibly degenerative change. Talar dome
intact. Osseous mineralization normal. No visible soft tissue
abnormality.
IMPRESSION: No acute osseous abnormality about the left ankle.

## 2023-01-18 ENCOUNTER — Other Ambulatory Visit: Payer: Self-pay

## 2023-01-18 ENCOUNTER — Emergency Department (HOSPITAL_COMMUNITY): Admission: EM | Admit: 2023-01-18 | Discharge: 2023-01-18 | Discharge status: Against Medical Advice | Payer: MEDICAID

## 2023-01-18 NOTE — ED Notes (Signed)
Pt registered walked to triage waiting area mumbled something and turned around walked out got into car left.
